# Patient Record
Sex: Female | Born: 1970 | Race: Black or African American | Hispanic: No | Marital: Married | Smoking: Never smoker
Health system: Southern US, Community
[De-identification: ages and names within clinical notes are randomized; demographics above are authoritative.]

## PROBLEM LIST (undated history)

## (undated) DIAGNOSIS — N921 Excessive and frequent menstruation with irregular cycle: Secondary | ICD-10-CM

## (undated) DIAGNOSIS — F909 Attention-deficit hyperactivity disorder, unspecified type: Secondary | ICD-10-CM

## (undated) DIAGNOSIS — H04129 Dry eye syndrome of unspecified lacrimal gland: Secondary | ICD-10-CM

## (undated) DIAGNOSIS — D649 Anemia, unspecified: Secondary | ICD-10-CM

## (undated) DIAGNOSIS — I82409 Acute embolism and thrombosis of unspecified deep veins of unspecified lower extremity: Secondary | ICD-10-CM

## (undated) DIAGNOSIS — K909 Intestinal malabsorption, unspecified: Secondary | ICD-10-CM

## (undated) HISTORY — DX: Anemia, unspecified: D64.9

## (undated) HISTORY — DX: Dry eye syndrome of unspecified lacrimal gland: H04.129

## (undated) HISTORY — DX: Attention-deficit hyperactivity disorder, unspecified type: F90.9

## (undated) HISTORY — DX: Intestinal malabsorption, unspecified: K90.9

## (undated) HISTORY — DX: Excessive and frequent menstruation with irregular cycle: N92.1

---

## 1990-01-19 HISTORY — PX: OTHER SURGICAL HISTORY: SHX169

## 2011-11-04 DIAGNOSIS — R41840 Attention and concentration deficit: Secondary | ICD-10-CM | POA: Insufficient documentation

## 2011-11-04 DIAGNOSIS — R519 Headache, unspecified: Secondary | ICD-10-CM | POA: Insufficient documentation

## 2011-11-04 DIAGNOSIS — G4700 Insomnia, unspecified: Secondary | ICD-10-CM | POA: Insufficient documentation

## 2012-01-15 ENCOUNTER — Encounter: Payer: Self-pay | Admitting: Physician Assistant

## 2012-01-15 ENCOUNTER — Ambulatory Visit (INDEPENDENT_AMBULATORY_CARE_PROVIDER_SITE_OTHER): Payer: Self-pay | Admitting: Physician Assistant

## 2012-01-15 VITALS — BP 120/76 | HR 87 | Ht 62.0 in | Wt 129.0 lb

## 2012-01-15 DIAGNOSIS — Z131 Encounter for screening for diabetes mellitus: Secondary | ICD-10-CM

## 2012-01-15 DIAGNOSIS — E559 Vitamin D deficiency, unspecified: Secondary | ICD-10-CM

## 2012-01-15 DIAGNOSIS — Z862 Personal history of diseases of the blood and blood-forming organs and certain disorders involving the immune mechanism: Secondary | ICD-10-CM

## 2012-01-15 DIAGNOSIS — Z0189 Encounter for other specified special examinations: Secondary | ICD-10-CM

## 2012-01-15 DIAGNOSIS — M2550 Pain in unspecified joint: Secondary | ICD-10-CM

## 2012-01-15 DIAGNOSIS — Z13220 Encounter for screening for lipoid disorders: Secondary | ICD-10-CM

## 2012-01-15 DIAGNOSIS — M25569 Pain in unspecified knee: Secondary | ICD-10-CM

## 2012-01-15 DIAGNOSIS — Z7689 Persons encountering health services in other specified circumstances: Secondary | ICD-10-CM

## 2012-01-15 DIAGNOSIS — F909 Attention-deficit hyperactivity disorder, unspecified type: Secondary | ICD-10-CM

## 2012-01-15 MED ORDER — LISDEXAMFETAMINE DIMESYLATE 70 MG PO CAPS: 70.0000 mg | ORAL_CAPSULE | ORAL | Status: DC

## 2012-01-15 NOTE — Patient Instructions (Addendum)
Will call with lab results. Will refer to ortho. Need to schedule pap smear.

## 2012-01-16 LAB — CBC WITH DIFFERENTIAL/PLATELET
Lymphocytes Relative: 34 % (ref 12–46)
Lymphs Abs: 2 10*3/uL (ref 0.7–4.0)
Neutro Abs: 3.2 10*3/uL (ref 1.7–7.7)
Neutrophils Relative %: 54 % (ref 43–77)
Platelets: 397 10*3/uL (ref 150–400)
RBC: 4.41 MIL/uL (ref 3.87–5.11)
WBC: 6 10*3/uL (ref 4.0–10.5)

## 2012-01-16 LAB — IRON AND TIBC
%SAT: 13 % — ABNORMAL LOW (ref 20–55)
TIBC: 234 ug/dL — ABNORMAL LOW (ref 250–470)

## 2012-01-17 DIAGNOSIS — E559 Vitamin D deficiency, unspecified: Secondary | ICD-10-CM | POA: Insufficient documentation

## 2012-01-17 DIAGNOSIS — D508 Other iron deficiency anemias: Secondary | ICD-10-CM | POA: Insufficient documentation

## 2012-01-17 NOTE — Progress Notes (Signed)
  Subjective:    Patient ID: April Vega, female    DOB: 1970/11/08, 41 y.o.   MRN: [id number]  HPI Patient present to the clinic to establish care. PMH was reviewed. She has a history of anemia per patient due to diet. She takes IV iron when she needs it. She also has had Vit. D deficency and is currently taking Vitamin D 5000 units daily.. Wants checked today. Not had cholesterol and routine labs checked in many years if ever. She is scheduled for Pap smear at obgyn. She will get yearly mammogram in July 2013.   She has been on Vyvanse for the last 3 months. 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 She is not completely happy with it but wants to give it 3 more months.  She has a lot of bilateral knee pain but greater in left knee. She was seen by orthopedic and wants to be referred to one closer to [facility].     Review of Systems     Objective:   Physical Exam  Constitutional: She is oriented to person, place, and time. She appears well-developed and well-nourished.  HENT:  Head: Normocephalic and atraumatic.  Neck: Normal range of motion. Neck supple. No thyromegaly present.  Cardiovascular: Normal rate, regular rhythm and normal heart sounds.   Pulmonary/Chest: Effort normal and breath sounds normal. She has no wheezes.  Lymphadenopathy:    She has no cervical adenopathy.  Neurological: She is alert and oriented to person, place, and time.  Skin: Skin is warm and dry.  Psychiatric: She has a normal mood and affect. Her behavior is normal.          Assessment & Plan:  History of anemia- Will get CBC and iron panel today. Will call with results.   Vitamin D def- Check Vit D today and call with results.  ADHD- Refilled Vyvanse. Will need to come and get prescription monthly. Recheck in 3 months.  Joint pain- Will referred to ortho per patients request.  Lab form sent with patient to get Lipid and CMp drawn. Need to follow up with CPE.

## 2012-02-03 ENCOUNTER — Ambulatory Visit
Admission: RE | Admit: 2012-02-03 | Discharge: 2012-02-03 | Disposition: A | Discharge status: Home / Self Care | Payer: BC Managed Care – PPO | Source: Ambulatory Visit | Attending: Sports Medicine | Admitting: Sports Medicine

## 2012-02-03 ENCOUNTER — Other Ambulatory Visit: Payer: Self-pay | Admitting: Sports Medicine

## 2012-02-03 DIAGNOSIS — R52 Pain, unspecified: Secondary | ICD-10-CM

## 2012-02-03 DIAGNOSIS — M25562 Pain in left knee: Secondary | ICD-10-CM

## 2012-02-05 ENCOUNTER — Other Ambulatory Visit (HOSPITAL_COMMUNITY): Payer: Self-pay | Admitting: Sports Medicine

## 2012-02-05 DIAGNOSIS — M79605 Pain in left leg: Secondary | ICD-10-CM

## 2012-02-10 ENCOUNTER — Other Ambulatory Visit (HOSPITAL_COMMUNITY): Payer: BC Managed Care – PPO

## 2012-02-10 ENCOUNTER — Encounter (HOSPITAL_COMMUNITY): Payer: BC Managed Care – PPO

## 2012-02-19 ENCOUNTER — Other Ambulatory Visit (HOSPITAL_COMMUNITY): Payer: Self-pay | Admitting: Sports Medicine

## 2012-02-19 DIAGNOSIS — M79605 Pain in left leg: Secondary | ICD-10-CM

## 2012-02-26 ENCOUNTER — Ambulatory Visit: Payer: BC Managed Care – PPO | Admitting: Physician Assistant

## 2012-02-29 ENCOUNTER — Other Ambulatory Visit (HOSPITAL_COMMUNITY): Payer: BC Managed Care – PPO

## 2012-02-29 ENCOUNTER — Encounter (HOSPITAL_COMMUNITY): Payer: BC Managed Care – PPO

## 2012-02-29 ENCOUNTER — Ambulatory Visit: Payer: BC Managed Care – PPO | Admitting: Physician Assistant

## 2012-03-03 ENCOUNTER — Ambulatory Visit: Payer: BC Managed Care – PPO | Admitting: Family Medicine

## 2012-03-11 ENCOUNTER — Encounter (HOSPITAL_COMMUNITY)
Admission: RE | Admit: 2012-03-11 | Discharge: 2012-03-11 | Disposition: A | Discharge status: Home / Self Care | Payer: BC Managed Care – PPO | Source: Ambulatory Visit | Attending: Sports Medicine | Admitting: Sports Medicine

## 2012-03-11 DIAGNOSIS — M79605 Pain in left leg: Secondary | ICD-10-CM

## 2012-03-11 DIAGNOSIS — M79609 Pain in unspecified limb: Secondary | ICD-10-CM | POA: Insufficient documentation

## 2012-03-11 DIAGNOSIS — C50919 Malignant neoplasm of unspecified site of unspecified female breast: Secondary | ICD-10-CM | POA: Insufficient documentation

## 2012-03-11 MED ORDER — TECHNETIUM TC 99M MEDRONATE IV KIT
25.0000 | PACK | Freq: Once | INTRAVENOUS | Status: AC | PRN
  Administered 2012-03-11: 25 via INTRAVENOUS

## 2012-03-14 ENCOUNTER — Ambulatory Visit (INDEPENDENT_AMBULATORY_CARE_PROVIDER_SITE_OTHER): Payer: BC Managed Care – PPO | Admitting: Physician Assistant

## 2012-03-14 ENCOUNTER — Encounter: Payer: Self-pay | Admitting: Physician Assistant

## 2012-03-14 VITALS — BP 119/76 | HR 90 | Ht 62.0 in | Wt 126.0 lb

## 2012-03-14 DIAGNOSIS — R5383 Other fatigue: Secondary | ICD-10-CM

## 2012-03-14 DIAGNOSIS — Z131 Encounter for screening for diabetes mellitus: Secondary | ICD-10-CM

## 2012-03-14 DIAGNOSIS — R5381 Other malaise: Secondary | ICD-10-CM

## 2012-03-14 DIAGNOSIS — Z13220 Encounter for screening for lipoid disorders: Secondary | ICD-10-CM

## 2012-03-14 DIAGNOSIS — D649 Anemia, unspecified: Secondary | ICD-10-CM

## 2012-03-14 DIAGNOSIS — F909 Attention-deficit hyperactivity disorder, unspecified type: Secondary | ICD-10-CM

## 2012-03-14 MED ORDER — ATOMOXETINE HCL 40 MG PO CAPS: 40.0000 mg | ORAL_CAPSULE | Freq: Every day | ORAL | Status: DC

## 2012-03-14 NOTE — Patient Instructions (Addendum)
Follow up in 1 month with me for CPE and ADHD recheck. Start Strattera and see if you have any benefit. Will call with labs.

## 2012-03-16 DIAGNOSIS — F909 Attention-deficit hyperactivity disorder, unspecified type: Secondary | ICD-10-CM | POA: Insufficient documentation

## 2012-03-16 LAB — COMPLETE METABOLIC PANEL WITH GFR
BUN: 7 mg/dL (ref 6–23)
CO2: 27 mEq/L (ref 19–32)
Calcium: 9.6 mg/dL (ref 8.4–10.5)
Chloride: 106 mEq/L (ref 96–112)
Creat: 0.62 mg/dL (ref 0.50–1.10)
GFR, Est African American: >89 mL/min
Total Bilirubin: 0.5 mg/dL (ref 0.3–1.2)

## 2012-03-16 LAB — LIPID PANEL
LDL Cholesterol: 107 mg/dL — ABNORMAL HIGH (ref 0–99)
Total CHOL/HDL Ratio: 2.5 Ratio

## 2012-03-16 LAB — VITAMIN B12: Vitamin B-12: 427 pg/mL (ref 211–911)

## 2012-03-16 NOTE — Progress Notes (Signed)
  Subjective:    Patient ID: April Vega, female    DOB: 09/21/1971, 41 y.o.   MRN: [id number]  HPI Patient comes in to follow up on Vyvanse and ADHD. Pt had been on vyvanse for 6 months and feels like it is not working. She still has a lot of problems focusing at work. She feels like she has to continually be doing something but she doesn't do anything well. Has also been more tired lately. She has a hx of anemia and has to get regular infusions.   She did not get labs drawn for CPE. Will do this time.   Review of Systems     Objective:   Physical Exam  Constitutional: She is oriented to person, place, and time. She appears well-developed and well-nourished.  HENT:  Head: Normocephalic and atraumatic.  Cardiovascular: Normal rate, regular rhythm and normal heart sounds.   Pulmonary/Chest: Effort normal and breath sounds normal. She has no wheezes.  Neurological: She is alert and oriented to person, place, and time.  Skin: Skin is warm and dry.  Psychiatric: She has a normal mood and affect. Her behavior is normal.          Assessment & Plan:  Anemia- this is ongoing hx and complete work up has been done. Patient gets infusions regularly. Will check HgB today and see where she is at. She made need an infusion.   Fatigue- This could likely be from low HgB; will also check a b12.   ADHD-encouraged regular exercise to help with focus. Discuss Strattera and patient wants to try. Will follow up in 1 month. Side effects discussed along with risk of suicidal thoughts. If patient has any side effects she is to call office and stop med.   Gave lab slip and needs to schedule CPE.

## 2012-04-13 ENCOUNTER — Encounter: Payer: BC Managed Care – PPO | Admitting: Physician Assistant

## 2012-04-15 ENCOUNTER — Encounter: Payer: Self-pay | Admitting: Physician Assistant

## 2012-04-15 ENCOUNTER — Other Ambulatory Visit (HOSPITAL_COMMUNITY)
Admission: RE | Admit: 2012-04-15 | Discharge: 2012-04-15 | Disposition: A | Discharge status: Home / Self Care | Payer: BC Managed Care – PPO | Source: Ambulatory Visit | Attending: Family Medicine | Admitting: Family Medicine

## 2012-04-15 ENCOUNTER — Ambulatory Visit (INDEPENDENT_AMBULATORY_CARE_PROVIDER_SITE_OTHER): Payer: BC Managed Care – PPO | Admitting: Physician Assistant

## 2012-04-15 VITALS — BP 105/62 | HR 82 | Ht 62.0 in | Wt 130.0 lb

## 2012-04-15 DIAGNOSIS — Z0000 Encounter for general adult medical examination without abnormal findings: Secondary | ICD-10-CM

## 2012-04-15 DIAGNOSIS — Z01419 Encounter for gynecological examination (general) (routine) without abnormal findings: Secondary | ICD-10-CM | POA: Insufficient documentation

## 2012-04-15 DIAGNOSIS — F909 Attention-deficit hyperactivity disorder, unspecified type: Secondary | ICD-10-CM

## 2012-04-15 MED ORDER — LISDEXAMFETAMINE DIMESYLATE 70 MG PO CAPS: 70.0000 mg | ORAL_CAPSULE | ORAL | Status: DC

## 2012-04-15 NOTE — Progress Notes (Signed)
Subjective:    Patient ID: April Vega, female    DOB: [birth year], 41 y.o.   MRN: [id number]  HPI     Review of Systems     Objective:   Physical Exam        Assessment & Plan:   Subjective:     April Vega is a 41 y.o. female and is here for a comprehensive physical exam. The patient reports Strattera has not worked at all. She feels like she is not even on medication. she is just as not focused and foggy brain as ever. She has had formal evaluation for ADHD and was borderline. She was beening treated with vyvanse and it worked well in the past. She is seeing a neurologist to evaluate for any other causes that might not be due to ADHD at all. She is scheduled for a MRI of brain in upcoming weeks. 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  She feels good other wise. She has normal bowel movements, no chest pain, or general pain. She tries to exercise regularly and eat right.   History   Social History  . Marital Status: Married    Spouse Name: N/A    Number of Children: N/A  . Years of Education: N/A   Occupational History  . Not on file.   Social History Main Topics  . Smoking status: Never Smoker   . Smokeless tobacco: Not on file  . Alcohol Use: Not on file  . Drug Use: Not on file  . Sexually Active: Not on file   Other Topics Concern  . Not on file   Social History Narrative  . No narrative on file   Health Maintenance  Topic Date Due  . Mammogram  04/15/2012  . Influenza Vaccine  06/21/2012  . Pap Smear  04/16/2015  . Tetanus/tdap  10/16/2020    The following portions of the patient's history were reviewed and updated as appropriate: allergies, current medications, past family history, past medical history, past social history, past surgical history and problem list.  Review of Systems Pertinent items are noted in HPI.   Objective:    BP 105/62  Pulse 82  Ht 5\' 2"  (1.575 m)  Wt 130 lb (58.968 kg)  BMI 23.78 kg/m2  SpO2 100%  LMP  04/09/2012 General appearance: alert, cooperative and appears stated age Head: Normocephalic, without obvious abnormality, atraumatic Eyes: conjunctivae/corneas clear. PERRL, EOM's intact. Fundi benign. Ears: normal TM's and external ear canals both ears Nose: Nares normal. Septum midline. Mucosa normal. No drainage or sinus tenderness. Throat: lips, mucosa, and tongue normal; teeth and gums normal Neck: no adenopathy, no carotid bruit, no JVD, supple, symmetrical, trachea midline and thyroid not enlarged, symmetric, no tenderness/mass/nodules Back: symmetric, no curvature. ROM normal. No CVA tenderness. Lungs: clear to auscultation bilaterally Breasts: Normal appearance, many lumps and different breast consisitieties felt throughout both breast. 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 Lymph nodes: Cervical, supraclavicular, and axillary nodes normal. Neurologic: Alert and oriented X 3, normal strength and tone. Normal symmetric reflexes. Normal coordination  and gait    Assessment:    Healthy female exam.      Plan:    CPE- labs have already been drawn and look great! Will call with pap results. Last mammogram was in Jan/2013 does need one and to follow up in 6 months since had Left breast cancer. Previous breast center is following up with patient. I don't have any paperwork on her evaluation of ADHD, Breast cancer or contact with neurologist. Dr. Rosalyn Charters is her neurologist. I  discussed with the patient that it would be beneficial to have so that I could better follow and treat her. She stated she would get me records. Continue to maintain healthy diet and exercise regulary. Follow up in 1 month with change to vyvanse. Gave pt card. See After Visit Summary for Counseling Recommendations

## 2012-04-15 NOTE — Patient Instructions (Addendum)
Recommend calcium 4 servings daily or 500mg  twice a day. STart back on vyvanse and recheck in 1 month. Continue to have healthy diet and exercise regularly.

## 2012-04-18 ENCOUNTER — Encounter: Payer: Self-pay | Admitting: Physician Assistant

## 2012-05-13 ENCOUNTER — Ambulatory Visit: Payer: BC Managed Care – PPO | Admitting: Physician Assistant

## 2012-06-15 ENCOUNTER — Ambulatory Visit (INDEPENDENT_AMBULATORY_CARE_PROVIDER_SITE_OTHER): Payer: BC Managed Care – PPO | Admitting: Physician Assistant

## 2012-06-15 ENCOUNTER — Encounter: Payer: Self-pay | Admitting: Physician Assistant

## 2012-06-15 VITALS — BP 117/76 | HR 83 | Ht 62.0 in | Wt 129.0 lb

## 2012-06-15 DIAGNOSIS — F909 Attention-deficit hyperactivity disorder, unspecified type: Secondary | ICD-10-CM

## 2012-06-15 DIAGNOSIS — R9389 Abnormal findings on diagnostic imaging of other specified body structures: Secondary | ICD-10-CM

## 2012-06-15 NOTE — Progress Notes (Signed)
  Subjective:    Patient ID: April Vega, female    DOB: 03-Aug-1971, 41 y.o.   MRN: [id number]  HPI Patient presents to the clinic to follow up on ADHD she brings in copies of her formal evaluation for ADHD which we will scan in. Pt denies any palpitations, CP, insomnia,mood changes,  loss of appetite or increase in BP. She believes it has helped some and the most any medication has helped with focus but doesn't feel like it has helped as much as before when she was on it. She also wants to try something cheaper because money is tight right now.   She also would like a second opinion neurologist. She recently had MRI and Cornter stone Elanie Ferria suspects MS. She wants someone else to look at MRI.    Review of Systems     Objective:   Physical Exam  Constitutional: She is oriented to person, place, and time. She appears well-developed and well-nourished.  HENT:  Head: Normocephalic and atraumatic.  Cardiovascular: Normal rate, regular rhythm and normal heart sounds.   Pulmonary/Chest: Effort normal and breath sounds normal.  Neurological: She is alert and oriented to person, place, and time.  Skin: Skin is warm and dry.  Psychiatric: She has a normal mood and affect. Her behavior is normal.          Assessment & Plan:  ADHD- I would be more than happy to refill Vyanse but patient wants to try cheaper. I told her to call insurance and find out what stimulants would be the cheapest for her. I will print rx and refill for next 2 months. Follow up in 3 months.   Abnormal MRI- Neurologist suspects MS but wants to do further testing will refer for neurologist. Pt encouraged to get all documentation sent to office.

## 2012-06-15 NOTE — Patient Instructions (Addendum)
Referral for 2nd opinion neurologist. Call if you don't hear from [place] in next week.   Call with ADHD med that insurance will cover.

## 2012-06-17 ENCOUNTER — Other Ambulatory Visit: Payer: Self-pay | Admitting: Physician Assistant

## 2012-06-17 ENCOUNTER — Telehealth: Payer: Self-pay | Admitting: *Deleted

## 2012-06-17 MED ORDER — METHYLPHENIDATE HCL ER 20 MG PO TBCR: 20.0000 mg | EXTENDED_RELEASE_TABLET | ORAL | Status: DC

## 2012-06-17 NOTE — Progress Notes (Signed)
Let patient know I have printed rx and available for pick up. I started with 20mg  once a day may increase after one month depending on response.

## 2012-06-17 NOTE — Telephone Encounter (Signed)
Pt called insurance and they will cover the Methylphenidate ER 20mg  one tab twice a day

## 2012-06-17 NOTE — Progress Notes (Signed)
LMOM informing Pt  

## 2012-07-18 ENCOUNTER — Encounter: Payer: Self-pay | Admitting: Physician Assistant

## 2012-07-18 ENCOUNTER — Ambulatory Visit (INDEPENDENT_AMBULATORY_CARE_PROVIDER_SITE_OTHER): Payer: BC Managed Care – PPO | Admitting: Physician Assistant

## 2012-07-18 VITALS — BP 122/68 | HR 93 | Temp 99.1°F | Wt 128.0 lb

## 2012-07-18 DIAGNOSIS — R21 Rash and other nonspecific skin eruption: Secondary | ICD-10-CM

## 2012-07-18 DIAGNOSIS — D649 Anemia, unspecified: Secondary | ICD-10-CM

## 2012-07-18 DIAGNOSIS — R9389 Abnormal findings on diagnostic imaging of other specified body structures: Secondary | ICD-10-CM

## 2012-07-18 DIAGNOSIS — D508 Other iron deficiency anemias: Secondary | ICD-10-CM

## 2012-07-18 DIAGNOSIS — F909 Attention-deficit hyperactivity disorder, unspecified type: Secondary | ICD-10-CM

## 2012-07-18 MED ORDER — METHYLPHENIDATE HCL ER 20 MG PO TBCR: EXTENDED_RELEASE_TABLET | ORAL | Status: DC

## 2012-07-18 MED ORDER — MOMETASONE FUROATE 0.1 % EX CREA: 1.0000 "application " | TOPICAL_CREAM | Freq: Every day | CUTANEOUS | Status: DC

## 2012-07-18 NOTE — Progress Notes (Signed)
  Subjective:    Patient ID: Nadira Single, female    DOB: May 03, 1971, 41 y.o.   MRN: [id number]  HPI Patient presents to the clinic to follow up on ADHD, to discuss referral and to get a refill for cream for rash.   ADHD is not responding to Metadate 20mg  which insurance will pay for. She really doesn't feel like it benefits her at all. She has had the best response on vyvanse but wants to try highest dose before she pays for vyvanse. Denies any CP, palpitations, insomnia, or blood pressure problems.   She was never called with referral to neurologist to get 2nd opinion on MRI concerning MS. I did make referral and she was never called.   She has an on again off again rash on face. She was given Elocon cream and it does work. She needs refill today. She does not have rash today.   History of anemia. Started to feel more cold and fatigue usually means infusion is needed. Would like to go to [facility] hematology.    Review of Systems     Objective:   Physical Exam  Constitutional: She is oriented to person, place, and time. She appears well-developed and well-nourished.  HENT:  Head: Normocephalic and atraumatic.  Cardiovascular: Normal rate, regular rhythm and normal heart sounds.   Pulmonary/Chest: Effort normal and breath sounds normal. She has no wheezes.  Neurological: She is alert and oriented to person, place, and time.  Skin: Skin is dry.       Rash not present today.  Psychiatric: She has a normal mood and affect. Her behavior is normal.          Assessment & Plan:  ADHD- Increased to 40mg  in am of metadate. If not improving in 2 weeks then increase by 20mg  more to 60mg  today in the morning. If not working then call and I suggest that you pay for vyvanse monthly.   Abnormal MRI- Victorino Dike made appt while in office for [place] neurolog[facility] to discuss MS diagnosis from previous MRI. Pt given number to call [place] if not contacted in next week. Referral was made previously but  not completed.  Rash- Refilled elocon cream. Encouraged pt not to use cream all the time because could cause some discoloration/hypopgimentation.   Anemia- Will check CBC and ferritin level today and see if needs to be set up with [facility] hematology for iron infusion.   Pt got flu shot at work.

## 2012-07-19 LAB — CBC WITH DIFFERENTIAL/PLATELET
Basophils Absolute: 0.1 10*3/uL (ref 0.0–0.1)
Eosinophils Relative: 7 % — ABNORMAL HIGH (ref 0–5)
Lymphocytes Relative: 37 % (ref 12–46)
Neutro Abs: 3.1 10*3/uL (ref 1.7–7.7)
Neutrophils Relative %: 45 % (ref 43–77)
Platelets: 444 10*3/uL — ABNORMAL HIGH (ref 150–400)
RDW: 13.4 % (ref 11.5–15.5)
WBC: 6.8 10*3/uL (ref 4.0–10.5)

## 2012-07-19 LAB — FERRITIN: Ferritin: 56 ng/mL (ref 10–291)

## 2012-07-20 ENCOUNTER — Other Ambulatory Visit: Payer: Self-pay | Admitting: Physician Assistant

## 2012-07-20 ENCOUNTER — Telehealth: Payer: Self-pay | Admitting: Physician Assistant

## 2012-07-20 DIAGNOSIS — Z862 Personal history of diseases of the blood and blood-forming organs and certain disorders involving the immune mechanism: Secondary | ICD-10-CM

## 2012-07-20 LAB — IRON AND TIBC
Iron: 48 ug/dL (ref 42–145)
UIBC: 242 ug/dL (ref 125–400)

## 2012-07-20 NOTE — Telephone Encounter (Signed)
April Vega will you call [facility] hematology and find out what Hgb or ferritin levels have to be before they will do iron transfusion. This patient get iron infusion periodically but never been there. Her ferritin levels have dropped significantly and at 56 now but hgB is still 12.7.   Thanks.

## 2012-07-20 NOTE — Telephone Encounter (Signed)
PA from [facility]. States further eval is needed to determine.

## 2012-07-20 NOTE — Addendum Note (Signed)
Addended by: Ellsworth Lennox on: 07/20/2012 02:18 PM   Modules accepted: Orders

## 2012-08-17 ENCOUNTER — Other Ambulatory Visit: Payer: Self-pay | Admitting: *Deleted

## 2012-08-17 MED ORDER — METHYLPHENIDATE HCL ER 20 MG PO TBCR: EXTENDED_RELEASE_TABLET | ORAL | Status: DC

## 2012-08-17 NOTE — Telephone Encounter (Signed)
Pt calls and states that taking 2 of the methylphenadate 20mg  was not working so increased to 3 a day as you instructed her she could do. Needs new script printed with new directions. Call when ready for pick up

## 2012-08-17 NOTE — Telephone Encounter (Signed)
Pt.notified

## 2012-08-17 NOTE — Telephone Encounter (Signed)
Ready for pick up

## 2012-09-23 ENCOUNTER — Encounter: Payer: Self-pay | Admitting: Physician Assistant

## 2012-09-23 ENCOUNTER — Ambulatory Visit (INDEPENDENT_AMBULATORY_CARE_PROVIDER_SITE_OTHER): Payer: BC Managed Care – PPO | Admitting: Physician Assistant

## 2012-09-23 ENCOUNTER — Other Ambulatory Visit: Payer: Self-pay | Admitting: Physician Assistant

## 2012-09-23 VITALS — BP 115/73 | HR 86 | Ht 62.0 in | Wt 131.0 lb

## 2012-09-23 DIAGNOSIS — F411 Generalized anxiety disorder: Secondary | ICD-10-CM

## 2012-09-23 DIAGNOSIS — F909 Attention-deficit hyperactivity disorder, unspecified type: Secondary | ICD-10-CM

## 2012-09-23 DIAGNOSIS — D649 Anemia, unspecified: Secondary | ICD-10-CM

## 2012-09-23 MED ORDER — LISDEXAMFETAMINE DIMESYLATE 70 MG PO CAPS: 70.0000 mg | ORAL_CAPSULE | ORAL | Status: DC

## 2012-09-23 MED ORDER — CITALOPRAM HYDROBROMIDE 20 MG PO TABS: 20.0000 mg | ORAL_TABLET | Freq: Every day | ORAL | Status: DC

## 2012-09-23 MED ORDER — CLONAZEPAM 0.5 MG PO TABS: 0.5000 mg | ORAL_TABLET | Freq: Two times a day (BID) | ORAL | Status: DC | PRN

## 2012-09-23 NOTE — Patient Instructions (Signed)
Start celexa at night at 20mg  then after one week increase to 40mg . Only use klonapin as needed.   Stop metadate. Start [employer].

## 2012-09-23 NOTE — Progress Notes (Signed)
  Subjective:    Patient ID: April Vega, female    DOB: 08/23/1971, 42 y.o.   MRN: [id number]  HPI Patient presents to the clinic to followup on ADHD. She denies any side effects of medication. She does not feel like medication is helping her to focus. She felt the best on vyvanse the insurance however did not want to pay for vyvanse. She is up to the maximum dose on the Metadate. She still feels very unfocused. She also complains of lately feeling very anxious to the point of anxiety attacks. She keeps thinking that things she needs to do but cannot focus enough to do them. She is very easily annoyed and irritable. She has a lot of trouble relaxing and is worrying about a lot of issues. She does have problems falling asleep at night.    Review of Systems     Objective:   Physical Exam  Constitutional: She is oriented to person, place, and time. She appears well-developed and well-nourished.  HENT:  Head: Normocephalic and atraumatic.  Cardiovascular: Normal rate, regular rhythm and normal heart sounds.   Pulmonary/Chest: Effort normal and breath sounds normal. She has no wheezes.  Neurological: She is alert and oriented to person, place, and time.  Skin: Skin is warm and dry.  Psychiatric: She has a normal mood and affect. Her behavior is normal.          Assessment & Plan:  ADHD-patient was instructed to stop Metadate. We'll send Rx for Vyvanse to start. Patient experienced the most benefit at 70 mg and will start at that dose. Did briefly discuss with patient that Metadate could also be causing her anxiety to be worse as a side effect. While waiting for vyvanse approval and not being on any medication for ADHD let's see if any other anxious feelings improve as well as sleep.   GAD-  GAD-7 score was 14(moderate anxiety). Will start treatment with Celexa 20 mg at night and increase to 40 mg after one week. Discuss with patient that does take 4-6 weeks to get to therapeutic dosage.  Discussed side effects of worsening depression. The patient were to have suicidal thoughts to stop medication call office. Discussed that some people experience weight gain and sexual side effects. Followup in office in 6 weeks. Also gave her Klonopin to use for anxiety attacks only as needed. In detail described use and purpose for Klonopin

## 2012-09-27 LAB — IBC PANEL: %SAT: 12 % — ABNORMAL LOW (ref 20–55)

## 2012-09-27 LAB — HEMOGLOBIN: Hemoglobin: 12.7 g/dL (ref 12.0–15.0)

## 2012-09-27 LAB — IRON: Iron: 36 ug/dL — ABNORMAL LOW (ref 42–145)

## 2012-10-18 ENCOUNTER — Telehealth: Payer: Self-pay | Admitting: *Deleted

## 2012-10-18 NOTE — Telephone Encounter (Signed)
Pt is wondering if you will increase her celexa to 40mg .  She was also requesting a refill for her vyvanse but I told her that I couldn't fill it until Monday feb 3rd.  Please advise

## 2012-10-19 MED ORDER — CITALOPRAM HYDROBROMIDE 40 MG PO TABS: 40.0000 mg | ORAL_TABLET | Freq: Every day | ORAL | Status: DC

## 2012-10-19 NOTE — Telephone Encounter (Signed)
Pt.notified

## 2012-10-19 NOTE — Telephone Encounter (Signed)
Yes and sent to pharmacy. May double up on 20's until run out.

## 2012-10-24 ENCOUNTER — Other Ambulatory Visit: Payer: Self-pay | Admitting: *Deleted

## 2012-10-24 MED ORDER — LISDEXAMFETAMINE DIMESYLATE 70 MG PO CAPS: 70.0000 mg | ORAL_CAPSULE | ORAL | Status: DC

## 2012-10-24 NOTE — Telephone Encounter (Signed)
vyvanse refilled

## 2012-11-14 ENCOUNTER — Telehealth: Payer: Self-pay | Admitting: *Deleted

## 2012-11-14 DIAGNOSIS — E559 Vitamin D deficiency, unspecified: Secondary | ICD-10-CM

## 2012-11-14 DIAGNOSIS — D509 Iron deficiency anemia, unspecified: Secondary | ICD-10-CM

## 2012-11-15 NOTE — Telephone Encounter (Signed)
Labs entered.

## 2012-11-18 LAB — FERRITIN: Ferritin: 58 ng/mL (ref 10–291)

## 2012-11-18 LAB — VITAMIN D 25 HYDROXY (VIT D DEFICIENCY, FRACTURES): Vit D, 25-Hydroxy: 45 ng/mL (ref 30–89)

## 2012-11-18 LAB — IRON AND TIBC: Iron: 27 ug/dL — ABNORMAL LOW (ref 42–145)

## 2012-11-23 ENCOUNTER — Encounter: Payer: Self-pay | Admitting: Physician Assistant

## 2012-11-23 ENCOUNTER — Ambulatory Visit (INDEPENDENT_AMBULATORY_CARE_PROVIDER_SITE_OTHER): Payer: BC Managed Care – PPO

## 2012-11-23 ENCOUNTER — Ambulatory Visit (INDEPENDENT_AMBULATORY_CARE_PROVIDER_SITE_OTHER): Payer: BC Managed Care – PPO | Admitting: Physician Assistant

## 2012-11-23 VITALS — BP 112/71 | HR 96 | Wt 124.0 lb

## 2012-11-23 DIAGNOSIS — S99921A Unspecified injury of right foot, initial encounter: Secondary | ICD-10-CM

## 2012-11-23 DIAGNOSIS — E559 Vitamin D deficiency, unspecified: Secondary | ICD-10-CM

## 2012-11-23 DIAGNOSIS — F909 Attention-deficit hyperactivity disorder, unspecified type: Secondary | ICD-10-CM

## 2012-11-23 DIAGNOSIS — W19XXXA Unspecified fall, initial encounter: Secondary | ICD-10-CM

## 2012-11-23 DIAGNOSIS — D508 Other iron deficiency anemias: Secondary | ICD-10-CM

## 2012-11-23 DIAGNOSIS — S8990XA Unspecified injury of unspecified lower leg, initial encounter: Secondary | ICD-10-CM

## 2012-11-23 DIAGNOSIS — M79609 Pain in unspecified limb: Secondary | ICD-10-CM

## 2012-11-23 DIAGNOSIS — F411 Generalized anxiety disorder: Secondary | ICD-10-CM

## 2012-11-23 MED ORDER — TRAMADOL HCL 50 MG PO TABS: 50.0000 mg | ORAL_TABLET | Freq: Four times a day (QID) | ORAL | Status: DC | PRN

## 2012-11-23 MED ORDER — FERROUS SULFATE 325 (65 FE) MG PO TABS: 325.0000 mg | ORAL_TABLET | Freq: Two times a day (BID) | ORAL | Status: DC

## 2012-11-23 MED ORDER — LISDEXAMFETAMINE DIMESYLATE 70 MG PO CAPS: 70.0000 mg | ORAL_CAPSULE | ORAL | Status: DC

## 2012-11-23 MED ORDER — CITALOPRAM HYDROBROMIDE 40 MG PO TABS: 40.0000 mg | ORAL_TABLET | Freq: Every day | ORAL | Status: DC

## 2012-11-23 NOTE — Progress Notes (Signed)
  Subjective:    Patient ID: April Vega, female    DOB: [birth year], 42 y.o.   MRN: [id number]  HPI Patient presents to the clinic to get med refills and discuss new right foot injury.   Anxiety is ongoing but much more controlled today. Celexa is doing very well with occasional klonapin every other day or so. Denies any suicidal thoughts. Denies anything that makes worse or better.  ADHD is much better on Vyvanse. Still does not seem optimal. She feels like she absorbs at a faster rate. Sleeping well, does not feel like it causing jitterness. She is able to focus much better at work. She is still able to have an appetite and eats a balanced diet.   IDA- labs drawn and stable. Is currently taking OTC iron 325 twice a day. In the past she has not responded to oral iron. Would like to try something else and see if more effective. Does make her constipated and has to combat with diet. Refuses to eat food with iron in them stating she does not like them.   Vitamin D defiency labs drawn and stable and above normal. Does feel like she has more energy. Taking vit D daily.    Yesterday pt fell off step on right foot. Foot turned all the way back. Pt reports 6/10 pain. She has tried ASA, heating pad, sports cream and her right foot is swollen and painful to touch. Plantar flexion and touch makes pain worse and being still makes feel the best.       Review of Systems     Objective:   Physical Exam  Constitutional: She is oriented to person, place, and time. She appears well-developed and well-nourished.  HENT:  Head: Normocephalic and atraumatic.  Eyes: Conjunctivae are normal.  Neck: Normal range of motion. Neck supple. No thyromegaly present.  Cardiovascular: Normal rate, regular rhythm and normal heart sounds.   Pulmonary/Chest: Effort normal and breath sounds normal. She has no wheezes.  Musculoskeletal:  Right foot- no brusing. Swelling and erythema over top of right foot. Strength 5/5.  Pain with resistance to plantar flexion. Normal ROM. No laxity at ankle. Tenderness to palpation over top of foot. Pulses are 2+ and controlled.   Lymphadenopathy:    She has no cervical adenopathy.  Neurological: She is alert and oriented to person, place, and time.  Skin: Skin is warm and dry.  Psychiatric: She has a normal mood and affect. Her behavior is normal.          Assessment & Plan:  GAD- GAD-7 was 6. Greatly improved since last visit. Refilled Celexa.April Vega is as needed does not need a refill today. Seems much more controlled on this regimen.   ADHD- Refilled Vyvanse.   IDA- Gave samples of a new iron supplement Fusion plus daily. If pt likes them and does not irritate stomach will call in rx. Reassured pt that right now her stores and iron are very stable and not a candidate for IV iron.  Vitamin d defiency- Labs look great today stay on same dose.   Right foot injury- Xray revealed no fracture. Reassured pt that it is a soft tissue sprain/bruise. Wrapped with ace bandage for support. Encouraged ABC's with feet. Ibuprofen for pain and swelling. Ice and elevate. Call if not improving.

## 2012-11-23 NOTE — Patient Instructions (Signed)
Ibuprofen 800mg  up to three times a day. Tramadol only has needed. ABC's to strengthen muscles. Ice area 15-24minutes at night.   Try samples of fusion see if you tolerate them.   Will refer to dermatology.

## 2012-12-19 ENCOUNTER — Other Ambulatory Visit: Payer: Self-pay | Admitting: Physician Assistant

## 2012-12-23 ENCOUNTER — Other Ambulatory Visit: Payer: Self-pay | Admitting: *Deleted

## 2012-12-23 MED ORDER — LISDEXAMFETAMINE DIMESYLATE 70 MG PO CAPS: 70.0000 mg | ORAL_CAPSULE | ORAL | Status: DC

## 2013-01-27 ENCOUNTER — Ambulatory Visit (INDEPENDENT_AMBULATORY_CARE_PROVIDER_SITE_OTHER): Payer: BC Managed Care – PPO | Admitting: Physician Assistant

## 2013-01-27 ENCOUNTER — Encounter: Payer: Self-pay | Admitting: Physician Assistant

## 2013-01-27 VITALS — BP 130/77 | HR 97 | Wt 130.0 lb

## 2013-01-27 DIAGNOSIS — R454 Irritability and anger: Secondary | ICD-10-CM

## 2013-01-27 DIAGNOSIS — R4589 Other symptoms and signs involving emotional state: Secondary | ICD-10-CM

## 2013-01-27 DIAGNOSIS — F411 Generalized anxiety disorder: Secondary | ICD-10-CM

## 2013-01-27 MED ORDER — ESCITALOPRAM OXALATE 10 MG PO TABS: ORAL_TABLET | ORAL | Status: DC

## 2013-01-27 NOTE — Progress Notes (Signed)
  Subjective:    Patient ID: April Vega, female    DOB: 1971-04-22, 42 y.o.   MRN: [id number]  HPI Patient presents to the clinic with worsening aggravation, anxiety and anger issues. She has stopped celexa and vyvanse because she states they do not work. She has always had anxiety and celexa did work in the past. She wants to try lexapro. She feels like it is not like her to be so on edge.     Review of Systems     Objective:   Physical Exam  Constitutional: She is oriented to person, place, and time. She appears well-developed and well-nourished.  HENT:  Head: Normocephalic and atraumatic.  Cardiovascular: Normal rate, regular rhythm and normal heart sounds.   Pulmonary/Chest: Effort normal and breath sounds normal.  Neurological: She is alert and oriented to person, place, and time.  Skin: Skin is warm and dry.  Psychiatric: She has a normal mood and affect. Her behavior is normal.          Assessment & Plan:  GAD/anger- will give lexapro to start. Recheck in 6 weeks. Discussed with pt this is not a stimulant and should not help with ADHD issues. I mentioned adding wellbutrin to help with ADHD issues. She only wants to start one medication at a time.

## 2013-02-20 ENCOUNTER — Ambulatory Visit (INDEPENDENT_AMBULATORY_CARE_PROVIDER_SITE_OTHER): Payer: BC Managed Care – PPO | Admitting: Physician Assistant

## 2013-02-20 ENCOUNTER — Encounter: Payer: Self-pay | Admitting: Physician Assistant

## 2013-02-20 VITALS — BP 129/76 | HR 99 | Temp 98.0°F | Wt 134.0 lb

## 2013-02-20 DIAGNOSIS — D509 Iron deficiency anemia, unspecified: Secondary | ICD-10-CM

## 2013-02-20 DIAGNOSIS — R1012 Left upper quadrant pain: Secondary | ICD-10-CM

## 2013-02-20 DIAGNOSIS — E559 Vitamin D deficiency, unspecified: Secondary | ICD-10-CM

## 2013-02-20 DIAGNOSIS — F909 Attention-deficit hyperactivity disorder, unspecified type: Secondary | ICD-10-CM

## 2013-02-20 DIAGNOSIS — F411 Generalized anxiety disorder: Secondary | ICD-10-CM

## 2013-02-20 MED ORDER — ESCITALOPRAM OXALATE 20 MG PO TABS: 20.0000 mg | ORAL_TABLET | Freq: Every day | ORAL | Status: DC

## 2013-02-20 MED ORDER — LISDEXAMFETAMINE DIMESYLATE 70 MG PO CAPS: 70.0000 mg | ORAL_CAPSULE | ORAL | Status: DC

## 2013-02-20 NOTE — Progress Notes (Signed)
  Subjective:    Patient ID: April Vega, female    DOB: [birth year], 42 y.o.   MRN: [id number]  HPI Patient presents to the clinic with left upper quadrant/epigastric pain for last week and a half. Pain is off and on. It can go from dull and achy to sharp and gnawing. Not tried anything to make better. Nothing makes worse. Denies any n/v/d or fever. Did try tums and didn't help. Not starting any new meds.  Denies any radiation of pain to back. Denies any urinary symptoms.   Has anemia and vitamin D defiencency and wants labs checked.   Lexapro is helping with anxiety would like to increase to see if more effective. She has not been taking Vyvanse and would like to try in combination to see if can get a combination that works for her.    Review of Systems     Objective:   Physical Exam  Constitutional: She is oriented to person, place, and time. She appears well-developed and well-nourished.  HENT:  Head: Normocephalic and atraumatic.  Cardiovascular: Normal rate, regular rhythm and normal heart sounds.   Pulmonary/Chest: Effort normal and breath sounds normal.  Abdominal: Soft. Bowel sounds are normal.  Mild tenderness over LUQ and epigastric area.   Neurological: She is alert and oriented to person, place, and time.  Skin: Skin is warm and dry.  Psychiatric: She has a normal mood and affect. Her behavior is normal.          Assessment & Plan:  Left upper quadrant pain- since so sudden will check h.pylori. Gave samples of nexium to take every morning. Will also check pancreatic enzymes. Follow up if not improving.   Anemia/vit d- sent with labs to be checked.  ADHD- reorder vyanse. Follow up in 1 month.   Anxiety- refilled lexapro and increased to 20mg . Follow up in 1 month.

## 2013-02-20 NOTE — Patient Instructions (Addendum)
Don't take any anti-inflammatory. Stop any caffiene/carbonation.  Start nexium daily in the morning before breakfast. Call if not improving.

## 2013-02-21 ENCOUNTER — Other Ambulatory Visit: Payer: Self-pay | Admitting: Physician Assistant

## 2013-02-21 LAB — CBC WITH DIFFERENTIAL/PLATELET
Basophils Relative: 2 % — ABNORMAL HIGH (ref 0–1)
HCT: 37 % (ref 36.0–46.0)
Hemoglobin: 12.3 g/dL (ref 12.0–15.0)
Lymphocytes Relative: 23 % (ref 12–46)
MCHC: 33.2 g/dL (ref 30.0–36.0)
Monocytes Absolute: 0.9 10*3/uL (ref 0.1–1.0)
Monocytes Relative: 14 % — ABNORMAL HIGH (ref 3–12)
Neutro Abs: 3.1 10*3/uL (ref 1.7–7.7)

## 2013-02-21 LAB — LIPASE: Lipase: 45 U/L (ref 0–75)

## 2013-02-21 LAB — IRON: Iron: 38 ug/dL — ABNORMAL LOW (ref 42–145)

## 2013-02-21 LAB — AMYLASE: Amylase: 135 U/L — ABNORMAL HIGH (ref 0–105)

## 2013-02-21 LAB — IBC PANEL: UIBC: 273 ug/dL (ref 125–400)

## 2013-02-21 LAB — FERRITIN: Ferritin: 13 ng/mL (ref 10–291)

## 2013-02-21 MED ORDER — METRONIDAZOLE 500 MG PO TABS: ORAL_TABLET | ORAL | Status: DC

## 2013-02-21 MED ORDER — ESOMEPRAZOLE MAGNESIUM 40 MG PO PACK: PACK | ORAL | Status: DC

## 2013-02-21 MED ORDER — CLARITHROMYCIN 500 MG PO TABS: 500.0000 mg | ORAL_TABLET | Freq: Two times a day (BID) | ORAL | Status: DC

## 2013-02-24 ENCOUNTER — Other Ambulatory Visit: Payer: Self-pay | Admitting: Physician Assistant

## 2013-02-24 DIAGNOSIS — D649 Anemia, unspecified: Secondary | ICD-10-CM

## 2013-05-12 ENCOUNTER — Other Ambulatory Visit: Payer: Self-pay | Admitting: *Deleted

## 2013-05-12 DIAGNOSIS — F909 Attention-deficit hyperactivity disorder, unspecified type: Secondary | ICD-10-CM

## 2013-05-12 MED ORDER — LISDEXAMFETAMINE DIMESYLATE 70 MG PO CAPS: 70.0000 mg | ORAL_CAPSULE | ORAL | Status: DC

## 2013-05-12 MED ORDER — VITAMIN D3 125 MCG (5000 UT) PO CAPS: 1.0000 | ORAL_CAPSULE | Freq: Every day | ORAL | Status: DC

## 2013-05-15 ENCOUNTER — Telehealth: Payer: Self-pay | Admitting: *Deleted

## 2013-05-15 MED ORDER — VITAMIN D (ERGOCALCIFEROL) 1.25 MG (50000 UNIT) PO CAPS: 50000.0000 [IU] | ORAL_CAPSULE | ORAL | Status: DC

## 2013-05-15 NOTE — Telephone Encounter (Signed)
rx for vit d 50,000 units sent

## 2013-05-15 NOTE — Telephone Encounter (Signed)
Pt called today & states that she was wanting the 50,000 units of vit d & not the 5000 daily.  She said that the daily ones don't work well for her.

## 2013-05-15 NOTE — Telephone Encounter (Signed)
Ok to send new rx for 50,000 units weekly vitamin D with 2 refills.

## 2013-06-26 ENCOUNTER — Telehealth: Payer: Self-pay | Admitting: Physician Assistant

## 2013-06-26 NOTE — Telephone Encounter (Signed)
Ok for one more month need follow up within the month.

## 2013-06-26 NOTE — Telephone Encounter (Signed)
Pt wants refill on vyvance.

## 2013-06-26 NOTE — Telephone Encounter (Signed)
April Vega are you ok with filling this?  Looks like she needed to have followed up already.  Please advise

## 2013-06-27 ENCOUNTER — Other Ambulatory Visit: Payer: Self-pay | Admitting: *Deleted

## 2013-06-27 DIAGNOSIS — F909 Attention-deficit hyperactivity disorder, unspecified type: Secondary | ICD-10-CM

## 2013-06-27 MED ORDER — LISDEXAMFETAMINE DIMESYLATE 70 MG PO CAPS: 70.0000 mg | ORAL_CAPSULE | ORAL | Status: DC

## 2013-06-27 NOTE — Telephone Encounter (Signed)
rx printed 7 in your basket to sign.

## 2013-07-21 ENCOUNTER — Ambulatory Visit (INDEPENDENT_AMBULATORY_CARE_PROVIDER_SITE_OTHER): Payer: BC Managed Care – PPO | Admitting: Physician Assistant

## 2013-07-21 ENCOUNTER — Encounter: Payer: Self-pay | Admitting: Physician Assistant

## 2013-07-21 VITALS — BP 129/85 | HR 72 | Wt 136.0 lb

## 2013-07-21 DIAGNOSIS — R5381 Other malaise: Secondary | ICD-10-CM

## 2013-07-21 DIAGNOSIS — F909 Attention-deficit hyperactivity disorder, unspecified type: Secondary | ICD-10-CM

## 2013-07-21 DIAGNOSIS — F411 Generalized anxiety disorder: Secondary | ICD-10-CM

## 2013-07-21 DIAGNOSIS — R5383 Other fatigue: Secondary | ICD-10-CM

## 2013-07-21 DIAGNOSIS — G4700 Insomnia, unspecified: Secondary | ICD-10-CM

## 2013-07-21 MED ORDER — BUPROPION HCL ER (XL) 150 MG PO TB24: 150.0000 mg | ORAL_TABLET | Freq: Every day | ORAL | Status: DC

## 2013-07-21 MED ORDER — VITAMIN D3 125 MCG (5000 UT) PO CAPS: 1.0000 | ORAL_CAPSULE | Freq: Every day | ORAL | Status: DC

## 2013-07-21 NOTE — Patient Instructions (Addendum)
Insomnia- Melatonin 3mg -10mg  1 hour before. Good bedtime routine.   Start wellbutrin 150mg  once a day.  STrattera pack.   Insomnia Insomnia is frequent trouble falling and/or staying asleep. Insomnia can be a long term problem or a short term problem. Both are common. Insomnia can be a short term problem when the wakefulness is related to a certain stress or worry. Long term insomnia is often related to ongoing stress during waking hours and/or poor sleeping habits. Overtime, sleep deprivation itself can make the problem worse. Every little thing feels more severe because you are overtired and your ability to cope is decreased. CAUSES   Stress, anxiety, and depression.  Poor sleeping habits.  Distractions such as TV in the bedroom.  Naps close to bedtime.  Engaging in emotionally charged conversations before bed.  Technical reading before sleep.  Alcohol and other sedatives. They may make the problem worse. They can hurt normal sleep patterns and normal dream activity.  Stimulants such as caffeine for several hours prior to bedtime.  Pain syndromes and shortness of breath can cause insomnia.  Exercise late at night.  Changing time zones may cause sleeping problems (jet lag). It is sometimes helpful to have someone observe your sleeping patterns. They should look for periods of not breathing during the night (sleep apnea). They should also look to see how long those periods last. If you live alone or observers are uncertain, you can also be observed at a sleep clinic where your sleep patterns will be professionally monitored. Sleep apnea requires a checkup and treatment. Give your caregivers your medical history. Give your caregivers observations your family has made about your sleep.  SYMPTOMS   Not feeling rested in the morning.  Anxiety and restlessness at bedtime.  Difficulty falling and staying asleep. TREATMENT   Your caregiver may prescribe treatment for an underlying  medical disorders. Your caregiver can give advice or help if you are using alcohol or other drugs for self-medication. Treatment of underlying problems will usually eliminate insomnia problems.  Medications can be prescribed for short time use. They are generally not recommended for lengthy use.  Over-the-counter sleep medicines are not recommended for lengthy use. They can be habit forming.  You can promote easier sleeping by making lifestyle changes such as:  Using relaxation techniques that help with breathing and reduce muscle tension.  Exercising earlier in the day.  Changing your diet and the time of your last meal. No night time snacks.  Establish a regular time to go to bed.  Counseling can help with stressful problems and worry.  Soothing music and white noise may be helpful if there are background noises you cannot remove.  Stop tedious detailed work at least one hour before bedtime. HOME CARE INSTRUCTIONS   Keep a diary. Inform your caregiver about your progress. This includes any medication side effects. See your caregiver regularly. Take note of:  Times when you are asleep.  Times when you are awake during the night.  The quality of your sleep.  How you feel the next day. This information will help your caregiver care for you.  Get out of bed if you are still awake after 15 minutes. Read or do some quiet activity. Keep the lights down. Wait until you feel sleepy and go back to bed.  Keep regular sleeping and waking hours. Avoid naps.  Exercise regularly.  Avoid distractions at bedtime. Distractions include watching television or engaging in any intense or detailed activity like attempting to balance the  household checkbook.  Develop a bedtime ritual. Keep a familiar routine of bathing, brushing your teeth, climbing into bed at the same time each night, listening to soothing music. Routines increase the success of falling to sleep faster.  Use relaxation  techniques. This can be using breathing and muscle tension release routines. It can also include visualizing peaceful scenes. You can also help control troubling or intruding thoughts by keeping your mind occupied with boring or repetitive thoughts like the old concept of counting sheep. You can make it more creative like imagining planting one beautiful flower after another in your backyard garden.  During your day, work to eliminate stress. When this is not possible use some of the previous suggestions to help reduce the anxiety that accompanies stressful situations. MAKE SURE YOU:   Understand these instructions.  Will watch your condition.  Will get help right away if you are not doing well or get worse. Document Released: 09/04/2000 Document Revised: 11/30/2011 Document Reviewed: 10/05/2007 [facility] Patient Information 2014 [place], [place].

## 2013-07-21 NOTE — Progress Notes (Signed)
  Subjective:    Patient ID: April Vega, female    DOB: 06-19-1971, 42 y.o.   MRN: [id number]  HPI Patient is a 42 year old female today to presents to the clinic to followup on a dull achy ADHD. Patient does not feel like Vyvanse is working. She has tried multiple stimulants and she does not like they help with her symptoms. She denies any feelings of hopelessness or helplessness. She denies any suicidal thoughts or thoughts of hurting others. Her main symptoms are feeling like she cannot focus, cannot get things done and fatigue. She denies taking Lexapro. She is also having a lot of problems sleeping at night. She does work 2 jobs and does not home told 10:30 and 45 every night. She sounds are to relax and one done before 1 AM. And then she has to get up at 6:00am for work the next day. She has not tried anything to help her sleep. She does have a good schedule for neurology [facility] to help further evaluate her previous questionable MRIs.    .    Review of Systems     Objective:   Physical Exam  Constitutional: She is oriented to person, place, and time. She appears well-developed and well-nourished.  HENT:  Head: Normocephalic and atraumatic.  Neck: Normal range of motion. Neck supple. No thyromegaly present.  Cardiovascular: Normal rate, regular rhythm and normal heart sounds.   Pulmonary/Chest: Effort normal and breath sounds normal.  Neurological: She is alert and oriented to person, place, and time.  Skin: Skin is warm and dry.  Psychiatric: She has a normal mood and affect. Her behavior is normal.          Assessment & Plan:   Adult ADHD/unable to concentrate-patient was instructed to stop vyvanse. I would like to start her on Strattera however we do not have any samples. I will call the representative and see if we can get a sample pack for her to try and start. I would also like for her to start Wellbutrin once a day. Hold like to try a different neurotransmitter  and see if she responds better to Wellbutrin and other SSRIs. Followup in 4-6 weeks.  Patient aware she does need a complete physical.  Insomnia- I discussed melatonin with patient today. She can use this 3 mg up to 10 mg one hour before bedtime. I discussed with her working 2 jobs in the home so late is reason for her not able to shut down to labor. She needs to consider was working 2 jobs is going to her health. Discussed good bedtime routine. His most, if not improving we could consider other medications. Patient does not like the idea of knocking herself out with meds.

## 2013-08-22 ENCOUNTER — Other Ambulatory Visit: Payer: Self-pay | Admitting: Physician Assistant

## 2013-10-04 ENCOUNTER — Ambulatory Visit (INDEPENDENT_AMBULATORY_CARE_PROVIDER_SITE_OTHER): Payer: Managed Care, Other (non HMO) | Admitting: Physician Assistant

## 2013-10-04 ENCOUNTER — Encounter: Payer: Self-pay | Admitting: Physician Assistant

## 2013-10-04 ENCOUNTER — Other Ambulatory Visit (HOSPITAL_COMMUNITY)
Admission: RE | Admit: 2013-10-04 | Discharge: 2013-10-04 | Disposition: A | Discharge status: Home / Self Care | Payer: Managed Care, Other (non HMO) | Source: Ambulatory Visit | Attending: Family Medicine | Admitting: Family Medicine

## 2013-10-04 VITALS — BP 117/70 | HR 95 | Wt 140.0 lb

## 2013-10-04 DIAGNOSIS — F909 Attention-deficit hyperactivity disorder, unspecified type: Secondary | ICD-10-CM

## 2013-10-04 DIAGNOSIS — E559 Vitamin D deficiency, unspecified: Secondary | ICD-10-CM

## 2013-10-04 DIAGNOSIS — Z0000 Encounter for general adult medical examination without abnormal findings: Secondary | ICD-10-CM

## 2013-10-04 DIAGNOSIS — Z131 Encounter for screening for diabetes mellitus: Secondary | ICD-10-CM

## 2013-10-04 DIAGNOSIS — F411 Generalized anxiety disorder: Secondary | ICD-10-CM

## 2013-10-04 DIAGNOSIS — Z1151 Encounter for screening for human papillomavirus (HPV): Secondary | ICD-10-CM | POA: Insufficient documentation

## 2013-10-04 DIAGNOSIS — Z01419 Encounter for gynecological examination (general) (routine) without abnormal findings: Secondary | ICD-10-CM

## 2013-10-04 DIAGNOSIS — Z1239 Encounter for other screening for malignant neoplasm of breast: Secondary | ICD-10-CM

## 2013-10-04 DIAGNOSIS — Z13220 Encounter for screening for lipoid disorders: Secondary | ICD-10-CM

## 2013-10-04 DIAGNOSIS — D508 Other iron deficiency anemias: Secondary | ICD-10-CM

## 2013-10-04 DIAGNOSIS — Z79899 Other long term (current) drug therapy: Secondary | ICD-10-CM

## 2013-10-04 MED ORDER — BUPROPION HCL ER (XL) 300 MG PO TB24: 300.0000 mg | ORAL_TABLET | Freq: Every day | ORAL | Status: DC

## 2013-10-04 MED ORDER — VITAMIN D (ERGOCALCIFEROL) 1.25 MG (50000 UNIT) PO CAPS: 50000.0000 [IU] | ORAL_CAPSULE | ORAL | Status: DC

## 2013-10-04 MED ORDER — FUSION PLUS PO CAPS: ORAL_CAPSULE | ORAL | Status: DC
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
## 2013-10-04 NOTE — Patient Instructions (Signed)

## 2013-10-09 LAB — COMPLETE METABOLIC PANEL WITH GFR
ALT: 13 U/L (ref 0–35)
AST: 16 U/L (ref 0–37)
Albumin: 4.1 g/dL (ref 3.5–5.2)
Alkaline Phosphatase: 44 U/L (ref 39–117)
BILIRUBIN TOTAL: 0.4 mg/dL (ref 0.3–1.2)
BUN: 10 mg/dL (ref 6–23)
CO2: 26 mEq/L (ref 19–32)
CREATININE: 0.6 mg/dL (ref 0.50–1.10)
Calcium: 9.4 mg/dL (ref 8.4–10.5)
Chloride: 106 mEq/L (ref 96–112)
GLUCOSE: 78 mg/dL (ref 70–99)
Potassium: 4.2 mEq/L (ref 3.5–5.3)
Sodium: 140 mEq/L (ref 135–145)
Total Protein: 6.9 g/dL (ref 6.0–8.3)

## 2013-10-09 LAB — CBC
HCT: 38.7 % (ref 36.0–46.0)
Hemoglobin: 12.6 g/dL (ref 12.0–15.0)
MCH: 27.3 pg (ref 26.0–34.0)
MCHC: 32.6 g/dL (ref 30.0–36.0)
MCV: 83.8 fL (ref 78.0–100.0)
Platelets: 466 10*3/uL — ABNORMAL HIGH (ref 150–400)
RBC: 4.62 MIL/uL (ref 3.87–5.11)
RDW: 14 % (ref 11.5–15.5)
WBC: 5.1 10*3/uL (ref 4.0–10.5)

## 2013-10-09 LAB — LIPID PANEL
Cholesterol: 201 mg/dL — ABNORMAL HIGH (ref 0–200)
HDL: 85 mg/dL (ref >39)
LDL CALC: 109 mg/dL — AB (ref 0–99)
TRIGLYCERIDES: 36 mg/dL (ref <150)
Total CHOL/HDL Ratio: 2.4 Ratio
VLDL: 7 mg/dL (ref 0–40)

## 2013-10-10 ENCOUNTER — Ambulatory Visit: Payer: Managed Care, Other (non HMO)

## 2013-10-10 LAB — VITAMIN D 25 HYDROXY (VIT D DEFICIENCY, FRACTURES): Vit D, 25-Hydroxy: 38 ng/mL (ref 30–89)

## 2013-10-12 ENCOUNTER — Ambulatory Visit (INDEPENDENT_AMBULATORY_CARE_PROVIDER_SITE_OTHER): Payer: Managed Care, Other (non HMO)

## 2013-10-12 DIAGNOSIS — Z1239 Encounter for other screening for malignant neoplasm of breast: Secondary | ICD-10-CM

## 2013-10-12 DIAGNOSIS — Z1231 Encounter for screening mammogram for malignant neoplasm of breast: Secondary | ICD-10-CM

## 2013-12-14 ENCOUNTER — Other Ambulatory Visit: Payer: Self-pay | Admitting: Physician Assistant

## 2014-01-09 ENCOUNTER — Other Ambulatory Visit: Payer: Self-pay | Admitting: Physician Assistant

## 2014-04-02 ENCOUNTER — Telehealth: Payer: Self-pay | Admitting: *Deleted

## 2014-04-02 MED ORDER — AMBULATORY NON FORMULARY MEDICATION: Status: DC

## 2014-04-02 NOTE — Telephone Encounter (Signed)
Per patient request, "shoe build up" rxed. In box.

## 2014-04-02 NOTE — Telephone Encounter (Signed)
Left left vm fri afternoon asking for an rx for a "shoe build up".  She states that she can take it to a shoe repair store & they will add it on there.

## 2014-04-16 ENCOUNTER — Other Ambulatory Visit: Payer: Self-pay | Admitting: Physician Assistant

## 2014-04-23 ENCOUNTER — Ambulatory Visit (INDEPENDENT_AMBULATORY_CARE_PROVIDER_SITE_OTHER): Payer: BC Managed Care – PPO | Admitting: Physician Assistant

## 2014-04-23 ENCOUNTER — Encounter: Payer: Self-pay | Admitting: Physician Assistant

## 2014-04-23 VITALS — BP 111/69 | HR 72 | Ht 62.0 in | Wt 137.0 lb

## 2014-04-23 DIAGNOSIS — F909 Attention-deficit hyperactivity disorder, unspecified type: Secondary | ICD-10-CM | POA: Diagnosis not present

## 2014-04-23 DIAGNOSIS — R29898 Other symptoms and signs involving the musculoskeletal system: Secondary | ICD-10-CM | POA: Insufficient documentation

## 2014-04-23 DIAGNOSIS — M2170 Unequal limb length (acquired), unspecified site: Secondary | ICD-10-CM

## 2014-04-23 DIAGNOSIS — M25562 Pain in left knee: Secondary | ICD-10-CM

## 2014-04-23 DIAGNOSIS — M1712 Unilateral primary osteoarthritis, left knee: Secondary | ICD-10-CM | POA: Insufficient documentation

## 2014-04-23 DIAGNOSIS — M25569 Pain in unspecified knee: Secondary | ICD-10-CM

## 2014-04-23 MED ORDER — AMBULATORY NON FORMULARY MEDICATION: Status: DC

## 2014-04-23 MED ORDER — AMPHETAMINE-DEXTROAMPHET ER 25 MG PO CP24: 25.0000 mg | ORAL_CAPSULE | ORAL | Status: DC

## 2014-04-23 NOTE — Progress Notes (Signed)
   Subjective:    Patient ID: April Vega, female    DOB: [birth year], 43 y.o.   MRN: [id number]  HPI Pt is a 43 yo female who presents to the clinic to discuss ADHD. She has struggled to find a medication and effectively works for her symptoms. She has tried and failed vyvanse, wellbutrin, and strattera. Cost is also an issue. She really struggles and work to concentrate and stay focused on a task. She often does not complete task. She would like to try something else today.   She also needs more rx for shoe buildups for left shoe. Due to her leg length discrepancy of about 1 inch it helps eliminate some of her left knee and hip pain.    Review of Systems  All other systems reviewed and are negative.      Objective:   Physical Exam  Constitutional: She is oriented to person, place, and time. She appears well-developed and well-nourished.  HENT:  Head: Normocephalic and atraumatic.  Cardiovascular: Normal rate and normal heart sounds.   Pulmonary/Chest: Effort normal and breath sounds normal. She has no wheezes.  Neurological: She is alert and oriented to person, place, and time.  Skin: Skin is dry.  Psychiatric: She has a normal mood and affect. Her behavior is normal.          Assessment & Plan:  ADHD- will try adderall. Discussed SE. If has insomnia, anxiouness, anorexia please stop medication and call office. Follow up in 1 month.   Leg length discrepancy- rx of shoe build up x5 were given for her 5 pairs of shoes. She has them in one pair now and does seem to be helping knee and hip pain on left side.

## 2014-04-27 ENCOUNTER — Telehealth: Payer: Self-pay | Admitting: *Deleted

## 2014-04-27 NOTE — Telephone Encounter (Signed)
Adderall approved 02/26/14 thru 04/27/15. Patient and pharmacy aware. Margette Fast, CMA

## 2014-06-04 ENCOUNTER — Encounter: Payer: Self-pay | Admitting: Physician Assistant

## 2014-06-04 ENCOUNTER — Ambulatory Visit (INDEPENDENT_AMBULATORY_CARE_PROVIDER_SITE_OTHER): Payer: BC Managed Care – PPO | Admitting: Physician Assistant

## 2014-06-04 VITALS — BP 113/67 | HR 82 | Ht 62.0 in | Wt 133.0 lb

## 2014-06-04 DIAGNOSIS — F909 Attention-deficit hyperactivity disorder, unspecified type: Secondary | ICD-10-CM

## 2014-06-04 MED ORDER — AMPHETAMINE-DEXTROAMPHETAMINE 20 MG PO TABS: 20.0000 mg | ORAL_TABLET | Freq: Two times a day (BID) | ORAL | Status: DC

## 2014-06-04 NOTE — Progress Notes (Signed)
   Subjective:    Patient ID: April Vega, female    DOB: 1971-06-23, 43 y.o.   MRN: [id number]  HPI Pt presents to the clinic to follow up on Adderall XR daily. She still is not happy with results. She feels like takes too long to kick in and when it does last for a couple of hours and then feels like not working again. She just feels like she is very scattered brained. Denies any SE's of medication. No insomnia, palpitations.        Review of Systems  All other systems reviewed and are negative.      Objective:   Physical Exam  Constitutional: She is oriented to person, place, and time. She appears well-developed and well-nourished.  HENT:  Head: Normocephalic and atraumatic.  Cardiovascular: Normal rate, regular rhythm and normal heart sounds.   Pulmonary/Chest: Effort normal and breath sounds normal.  Neurological: She is alert and oriented to person, place, and time.  Psychiatric: She has a normal mood and affect. Her behavior is normal.          Assessment & Plan:  ADHD- switched from extended release to immediate release adderall twice a day. Follow up in 1-2 months.

## 2014-06-06 ENCOUNTER — Encounter: Payer: Self-pay | Admitting: Physician Assistant

## 2014-06-06 ENCOUNTER — Ambulatory Visit (INDEPENDENT_AMBULATORY_CARE_PROVIDER_SITE_OTHER): Payer: Self-pay

## 2014-06-06 ENCOUNTER — Ambulatory Visit (INDEPENDENT_AMBULATORY_CARE_PROVIDER_SITE_OTHER): Payer: Self-pay | Admitting: Physician Assistant

## 2014-06-06 VITALS — BP 130/74 | HR 86 | Ht 62.0 in | Wt 135.0 lb

## 2014-06-06 DIAGNOSIS — M549 Dorsalgia, unspecified: Secondary | ICD-10-CM

## 2014-06-06 DIAGNOSIS — M545 Low back pain, unspecified: Secondary | ICD-10-CM

## 2014-06-06 DIAGNOSIS — M5412 Radiculopathy, cervical region: Secondary | ICD-10-CM

## 2014-06-06 DIAGNOSIS — M4130 Thoracogenic scoliosis, site unspecified: Secondary | ICD-10-CM

## 2014-06-06 DIAGNOSIS — M542 Cervicalgia: Secondary | ICD-10-CM

## 2014-06-06 MED ORDER — PREDNISONE 50 MG PO TABS: ORAL_TABLET | ORAL | Status: DC

## 2014-06-06 MED ORDER — MELOXICAM 15 MG PO TABS: 15.0000 mg | ORAL_TABLET | Freq: Every day | ORAL | Status: DC

## 2014-06-06 MED ORDER — CYCLOBENZAPRINE HCL 10 MG PO TABS: 10.0000 mg | ORAL_TABLET | Freq: Three times a day (TID) | ORAL | Status: DC | PRN

## 2014-06-06 NOTE — Patient Instructions (Signed)
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
## 2014-06-12 ENCOUNTER — Telehealth: Payer: Self-pay | Admitting: *Deleted

## 2014-06-12 NOTE — Telephone Encounter (Signed)
PA approved for the change in Adderall therapy.  Pharm notified.

## 2014-08-01 ENCOUNTER — Ambulatory Visit (INDEPENDENT_AMBULATORY_CARE_PROVIDER_SITE_OTHER): Payer: Self-pay | Admitting: Physician Assistant

## 2014-08-01 ENCOUNTER — Encounter: Payer: Self-pay | Admitting: Physician Assistant

## 2014-08-01 DIAGNOSIS — M6248 Contracture of muscle, other site: Secondary | ICD-10-CM

## 2014-08-01 DIAGNOSIS — M62838 Other muscle spasm: Secondary | ICD-10-CM | POA: Insufficient documentation

## 2014-08-01 DIAGNOSIS — M62830 Muscle spasm of back: Secondary | ICD-10-CM | POA: Insufficient documentation

## 2014-08-01 DIAGNOSIS — M7542 Impingement syndrome of left shoulder: Secondary | ICD-10-CM | POA: Insufficient documentation

## 2014-08-01 MED ORDER — CYCLOBENZAPRINE HCL 10 MG PO TABS: 10.0000 mg | ORAL_TABLET | Freq: Three times a day (TID) | ORAL | Status: DC | PRN

## 2014-08-01 MED ORDER — MELOXICAM 15 MG PO TABS: 15.0000 mg | ORAL_TABLET | Freq: Every day | ORAL | Status: DC
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
## 2014-08-01 NOTE — Patient Instructions (Addendum)
PT to set up.

## 2014-08-23 ENCOUNTER — Ambulatory Visit (INDEPENDENT_AMBULATORY_CARE_PROVIDER_SITE_OTHER): Payer: Self-pay | Admitting: Physical Therapy

## 2014-08-23 DIAGNOSIS — M2550 Pain in unspecified joint: Secondary | ICD-10-CM

## 2014-08-23 DIAGNOSIS — M62838 Other muscle spasm: Secondary | ICD-10-CM

## 2014-08-23 DIAGNOSIS — M6248 Contracture of muscle, other site: Secondary | ICD-10-CM

## 2014-08-23 DIAGNOSIS — M62830 Muscle spasm of back: Secondary | ICD-10-CM

## 2014-08-29 ENCOUNTER — Other Ambulatory Visit: Payer: Self-pay | Admitting: *Deleted

## 2014-08-29 MED ORDER — AMPHETAMINE-DEXTROAMPHETAMINE 20 MG PO TABS: 20.0000 mg | ORAL_TABLET | Freq: Two times a day (BID) | ORAL | Status: DC

## 2014-08-30 ENCOUNTER — Encounter (INDEPENDENT_AMBULATORY_CARE_PROVIDER_SITE_OTHER): Payer: BC Managed Care – PPO | Admitting: Physical Therapy

## 2014-08-30 DIAGNOSIS — M62830 Muscle spasm of back: Secondary | ICD-10-CM

## 2014-08-30 DIAGNOSIS — M62838 Other muscle spasm: Secondary | ICD-10-CM

## 2014-08-30 DIAGNOSIS — M2550 Pain in unspecified joint: Secondary | ICD-10-CM

## 2014-08-30 DIAGNOSIS — M6248 Contracture of muscle, other site: Secondary | ICD-10-CM

## 2014-09-06 ENCOUNTER — Encounter: Payer: BC Managed Care – PPO | Admitting: Physical Therapy

## 2014-09-10 ENCOUNTER — Ambulatory Visit (INDEPENDENT_AMBULATORY_CARE_PROVIDER_SITE_OTHER): Payer: BC Managed Care – PPO | Admitting: Physician Assistant

## 2014-09-10 ENCOUNTER — Encounter: Payer: Self-pay | Admitting: Physician Assistant

## 2014-09-10 ENCOUNTER — Other Ambulatory Visit: Payer: BC Managed Care – PPO | Admitting: Physician Assistant

## 2014-09-10 ENCOUNTER — Encounter (INDEPENDENT_AMBULATORY_CARE_PROVIDER_SITE_OTHER): Payer: Self-pay | Admitting: Physical Therapy

## 2014-09-10 VITALS — BP 124/83 | HR 91 | Ht 62.0 in | Wt 137.0 lb

## 2014-09-10 DIAGNOSIS — M62830 Muscle spasm of back: Secondary | ICD-10-CM

## 2014-09-10 DIAGNOSIS — F900 Attention-deficit hyperactivity disorder, predominantly inattentive type: Secondary | ICD-10-CM | POA: Diagnosis not present

## 2014-09-10 DIAGNOSIS — D509 Iron deficiency anemia, unspecified: Secondary | ICD-10-CM | POA: Insufficient documentation

## 2014-09-10 DIAGNOSIS — M2550 Pain in unspecified joint: Secondary | ICD-10-CM

## 2014-09-10 DIAGNOSIS — M6248 Contracture of muscle, other site: Secondary | ICD-10-CM

## 2014-09-10 DIAGNOSIS — F909 Attention-deficit hyperactivity disorder, unspecified type: Secondary | ICD-10-CM

## 2014-09-10 DIAGNOSIS — E559 Vitamin D deficiency, unspecified: Secondary | ICD-10-CM | POA: Insufficient documentation

## 2014-09-10 DIAGNOSIS — M62838 Other muscle spasm: Secondary | ICD-10-CM

## 2014-09-10 MED ORDER — LISDEXAMFETAMINE DIMESYLATE 70 MG PO CAPS: 70.0000 mg | ORAL_CAPSULE | Freq: Every day | ORAL | Status: DC

## 2014-09-10 MED ORDER — FUSION PLUS PO CAPS: ORAL_CAPSULE | ORAL | Status: DC

## 2014-09-10 NOTE — Progress Notes (Signed)
   Subjective:    Patient ID: April Vega, female    DOB: October 25, 1970, 43 y.o.   MRN: [id number]  HPI Pt presents to the clinic to follow up on ADHD. We tried adderall immediate release twice a day she did not get benefit with this. vyvanse per pt helped the most but still has lots of problems with focus. She just doesn't feel effective at work.   She would like iron and vit D rechecked. She is not taking fusion because ran out of rx but taking oral iron 325mg  daily and vitamin D 1000 units daily.    Review of Systems  All other systems reviewed and are negative.      Objective:   Physical Exam  Constitutional: She is oriented to person, place, and time. She appears well-developed and well-nourished.  HENT:  Head: Normocephalic and atraumatic.  Cardiovascular: Normal rate, regular rhythm and normal heart sounds.   Pulmonary/Chest: Effort normal and breath sounds normal. She has no wheezes.  Neurological: She is alert and oriented to person, place, and time.  Skin: Skin is dry.  Psychiatric: She has a normal mood and affect. Her behavior is normal.          Assessment & Plan:  Adult ADHD- at this point need to refer out for management we have tried many different options without benefit. Pt did have formal work up at [facility] for ADHD but do not have report. Have asked pt to bring in to have scan into system. Referral made to Dr. Rachel Moulds. Restarted vyvanse the medication that has had best results.   IDA- will recheck since been on OTC iron.   Vitamin D insuffiency- will recheck today. Continue on oral 1000units until we let you know what level is.

## 2014-09-10 NOTE — Patient Instructions (Signed)
[facility] neurology.  ADHD specialist referral.

## 2014-09-11 LAB — CBC WITH DIFFERENTIAL/PLATELET
BASOS ABS: 0.2 10*3/uL — AB (ref 0.0–0.1)
Basophils Relative: 3 % — ABNORMAL HIGH (ref 0–1)
EOS PCT: 11 % — AB (ref 0–5)
Eosinophils Absolute: 0.6 10*3/uL (ref 0.0–0.7)
HCT: 38.9 % (ref 36.0–46.0)
Hemoglobin: 12.7 g/dL (ref 12.0–15.0)
LYMPHS PCT: 34 % (ref 12–46)
Lymphs Abs: 1.7 10*3/uL (ref 0.7–4.0)
MCH: 27.7 pg (ref 26.0–34.0)
MCHC: 32.6 g/dL (ref 30.0–36.0)
MCV: 84.7 fL (ref 78.0–100.0)
MPV: 9.4 fL (ref 9.4–12.4)
Monocytes Absolute: 0.6 10*3/uL (ref 0.1–1.0)
Monocytes Relative: 11 % (ref 3–12)
NEUTROS ABS: 2.1 10*3/uL (ref 1.7–7.7)
Neutrophils Relative %: 41 % — ABNORMAL LOW (ref 43–77)
PLATELETS: 556 10*3/uL — AB (ref 150–400)
RBC: 4.59 MIL/uL (ref 3.87–5.11)
RDW: 13.7 % (ref 11.5–15.5)
WBC: 5 10*3/uL (ref 4.0–10.5)

## 2014-09-11 LAB — VITAMIN D 25 HYDROXY (VIT D DEFICIENCY, FRACTURES): Vit D, 25-Hydroxy: 17 ng/mL — ABNORMAL LOW (ref 30–100)

## 2014-09-11 LAB — IRON AND TIBC
%SAT: 12 % — ABNORMAL LOW (ref 20–55)
Iron: 41 ug/dL — ABNORMAL LOW (ref 42–145)
TIBC: 349 ug/dL (ref 250–470)
UIBC: 308 ug/dL (ref 125–400)

## 2014-09-11 LAB — FERRITIN: Ferritin: 10 ng/mL (ref 10–291)

## 2014-09-12 ENCOUNTER — Telehealth: Payer: Self-pay | Admitting: *Deleted

## 2014-09-12 ENCOUNTER — Encounter: Payer: BC Managed Care – PPO | Admitting: Physical Therapy

## 2014-09-12 ENCOUNTER — Other Ambulatory Visit: Payer: Self-pay | Admitting: Physician Assistant

## 2014-09-12 MED ORDER — VITAMIN D (ERGOCALCIFEROL) 1.25 MG (50000 UNIT) PO CAPS: ORAL_CAPSULE | ORAL | Status: DC

## 2014-09-12 NOTE — Telephone Encounter (Signed)
Pt called back after I spoke with her to go over her lab results & left a vm stating that she would like for you to go ahead & place an allergy referral for her.

## 2014-09-16 ENCOUNTER — Other Ambulatory Visit: Payer: Self-pay | Admitting: Physician Assistant

## 2014-09-16 DIAGNOSIS — R898 Other abnormal findings in specimens from other organs, systems and tissues: Secondary | ICD-10-CM

## 2014-09-16 DIAGNOSIS — D721 Eosinophilia: Principal | ICD-10-CM

## 2014-09-16 NOTE — Telephone Encounter (Signed)
Referral placed.

## 2014-09-17 ENCOUNTER — Telehealth: Payer: Self-pay | Admitting: *Deleted

## 2014-09-17 ENCOUNTER — Encounter (INDEPENDENT_AMBULATORY_CARE_PROVIDER_SITE_OTHER): Payer: Self-pay | Admitting: Physical Therapy

## 2014-09-17 ENCOUNTER — Encounter: Payer: BC Managed Care – PPO | Admitting: Physical Therapy

## 2014-09-17 DIAGNOSIS — M62830 Muscle spasm of back: Secondary | ICD-10-CM

## 2014-09-17 DIAGNOSIS — M2550 Pain in unspecified joint: Secondary | ICD-10-CM

## 2014-09-17 DIAGNOSIS — M6248 Contracture of muscle, other site: Secondary | ICD-10-CM

## 2014-09-17 DIAGNOSIS — M62838 Other muscle spasm: Secondary | ICD-10-CM

## 2014-09-17 NOTE — Telephone Encounter (Signed)
Vyvanse prior approval. Pharmacy notified.

## 2014-09-18 NOTE — Telephone Encounter (Signed)
Pt notified of PA approval

## 2014-09-19 ENCOUNTER — Encounter (INDEPENDENT_AMBULATORY_CARE_PROVIDER_SITE_OTHER): Payer: Self-pay | Admitting: Physical Therapy

## 2014-09-19 DIAGNOSIS — M62830 Muscle spasm of back: Secondary | ICD-10-CM

## 2014-09-19 DIAGNOSIS — M6248 Contracture of muscle, other site: Secondary | ICD-10-CM

## 2014-09-19 DIAGNOSIS — M2550 Pain in unspecified joint: Secondary | ICD-10-CM

## 2014-09-19 DIAGNOSIS — M62838 Other muscle spasm: Secondary | ICD-10-CM

## 2014-09-20 ENCOUNTER — Encounter: Payer: BC Managed Care – PPO | Admitting: Physical Therapy

## 2014-09-27 ENCOUNTER — Encounter (INDEPENDENT_AMBULATORY_CARE_PROVIDER_SITE_OTHER): Payer: Self-pay | Admitting: Physical Therapy

## 2014-09-27 DIAGNOSIS — M62830 Muscle spasm of back: Secondary | ICD-10-CM

## 2014-09-27 DIAGNOSIS — M2550 Pain in unspecified joint: Secondary | ICD-10-CM

## 2014-09-27 DIAGNOSIS — M6248 Contracture of muscle, other site: Secondary | ICD-10-CM

## 2014-09-27 DIAGNOSIS — M62838 Other muscle spasm: Secondary | ICD-10-CM

## 2014-10-04 ENCOUNTER — Encounter (INDEPENDENT_AMBULATORY_CARE_PROVIDER_SITE_OTHER): Payer: Self-pay | Admitting: Physical Therapy

## 2014-10-04 DIAGNOSIS — M62838 Other muscle spasm: Secondary | ICD-10-CM

## 2014-10-04 DIAGNOSIS — M62830 Muscle spasm of back: Secondary | ICD-10-CM

## 2014-10-04 DIAGNOSIS — M2550 Pain in unspecified joint: Secondary | ICD-10-CM

## 2014-10-04 DIAGNOSIS — M6248 Contracture of muscle, other site: Secondary | ICD-10-CM

## 2014-10-11 ENCOUNTER — Encounter: Payer: BC Managed Care – PPO | Admitting: Physical Therapy

## 2014-10-15 ENCOUNTER — Other Ambulatory Visit: Payer: Self-pay | Admitting: Physician Assistant

## 2014-10-15 DIAGNOSIS — D721 Eosinophilia: Principal | ICD-10-CM

## 2014-10-15 DIAGNOSIS — R898 Other abnormal findings in specimens from other organs, systems and tissues: Secondary | ICD-10-CM | POA: Insufficient documentation

## 2014-10-15 MED ORDER — CETIRIZINE HCL 10 MG PO TABS: 10.0000 mg | ORAL_TABLET | Freq: Every day | ORAL | Status: DC

## 2014-10-18 ENCOUNTER — Encounter (INDEPENDENT_AMBULATORY_CARE_PROVIDER_SITE_OTHER): Payer: BC Managed Care – PPO | Admitting: Physical Therapy

## 2014-10-18 DIAGNOSIS — M6248 Contracture of muscle, other site: Secondary | ICD-10-CM

## 2014-10-18 DIAGNOSIS — M2550 Pain in unspecified joint: Secondary | ICD-10-CM

## 2014-10-18 DIAGNOSIS — M62830 Muscle spasm of back: Secondary | ICD-10-CM

## 2014-10-18 DIAGNOSIS — M62838 Other muscle spasm: Secondary | ICD-10-CM

## 2014-10-25 ENCOUNTER — Encounter: Payer: Self-pay | Admitting: Physical Therapy

## 2014-10-29 ENCOUNTER — Other Ambulatory Visit: Payer: Self-pay | Admitting: Physician Assistant

## 2014-12-12 ENCOUNTER — Encounter: Payer: Self-pay | Admitting: Physician Assistant

## 2014-12-17 ENCOUNTER — Encounter: Payer: Self-pay | Admitting: Physician Assistant

## 2014-12-17 ENCOUNTER — Ambulatory Visit (INDEPENDENT_AMBULATORY_CARE_PROVIDER_SITE_OTHER): Payer: Federal, State, Local not specified - PPO | Admitting: Physician Assistant

## 2014-12-17 VITALS — BP 117/73 | HR 90 | Ht 62.0 in | Wt 142.0 lb

## 2014-12-17 DIAGNOSIS — Z0000 Encounter for general adult medical examination without abnormal findings: Secondary | ICD-10-CM

## 2014-12-17 DIAGNOSIS — R41840 Attention and concentration deficit: Secondary | ICD-10-CM | POA: Insufficient documentation

## 2014-12-17 DIAGNOSIS — D649 Anemia, unspecified: Secondary | ICD-10-CM

## 2014-12-17 DIAGNOSIS — Z91048 Other nonmedicinal substance allergy status: Secondary | ICD-10-CM

## 2014-12-17 DIAGNOSIS — R413 Other amnesia: Secondary | ICD-10-CM | POA: Diagnosis not present

## 2014-12-17 DIAGNOSIS — Z131 Encounter for screening for diabetes mellitus: Secondary | ICD-10-CM

## 2014-12-17 DIAGNOSIS — Z9109 Other allergy status, other than to drugs and biological substances: Secondary | ICD-10-CM | POA: Insufficient documentation

## 2014-12-17 DIAGNOSIS — R635 Abnormal weight gain: Secondary | ICD-10-CM

## 2014-12-17 DIAGNOSIS — K219 Gastro-esophageal reflux disease without esophagitis: Secondary | ICD-10-CM

## 2014-12-17 MED ORDER — BUPROPION HCL ER (XL) 150 MG PO TB24: 150.0000 mg | ORAL_TABLET | ORAL | Status: DC

## 2014-12-17 MED ORDER — ESCITALOPRAM OXALATE 10 MG PO TABS: 10.0000 mg | ORAL_TABLET | Freq: Every day | ORAL | Status: DC

## 2014-12-17 MED ORDER — MONTELUKAST SODIUM 10 MG PO TABS: 10.0000 mg | ORAL_TABLET | Freq: Every day | ORAL | Status: DC

## 2014-12-17 NOTE — Patient Instructions (Signed)

## 2014-12-17 NOTE — Progress Notes (Signed)
Subjective:    Patient ID: April Vega, female    DOB: 1971/03/19, 44 y.o.   MRN: [id number]  HPI    Review of Systems     Objective:   Physical Exam        Assessment & Plan:   Subjective:     April Vega is a 44 y.o. female and is here for a comprehensive physical exam. The patient reports problems - pt continues to have problems with memory, inattention and work. she was recently seen by neurophyscologist that confirmed she did not think pt has ADHD and likely why stimulant was not working. pt did not want  report to be scanned into chart because wanted neurologist(a new one) to reevaluate. she continues to have memory issues and inattention issues. she has done well with school and at work with grades and performance. she needs refill on GERD medication. [name]  History   Social History  . Marital Status: Married    Spouse Name: N/A  . Number of Children: N/A  . Years of Education: N/A   Occupational History  . Not on file.   Social History Main Topics  . Smoking status: Never Smoker   . Smokeless tobacco: Not on file  . Alcohol Use: Not on file  . Drug Use: Not on file  . Sexual Activity: Not on file   Other Topics Concern  . Not on file   Social History Narrative   Health Maintenance  Topic Date Due  . HIV Screening  11/30/1983  . INFLUENZA VACCINE  04/22/2015  . MAMMOGRAM  04/22/2015  . PAP SMEAR  10/04/2016  . TETANUS/TDAP  10/16/2020    The following portions of the patient's history were reviewed and updated as appropriate: allergies, current medications, past family history, past medical history, past social history, past surgical history and problem list.  Review of Systems A comprehensive review of systems was negative.   Objective:    BP 117/73 mmHg  Pulse 90  Ht 5\' 2"  (1.575 m)  Wt 142 lb (64.411 kg)  BMI 25.97 kg/m2 General appearance: alert, cooperative and appears stated age Head: Normocephalic, without obvious abnormality,  atraumatic Eyes: conjunctivae/corneas clear. PERRL, EOM's intact. Fundi benign. Ears: normal TM's and external ear canals both ears Nose: Nares normal. Septum midline. Mucosa normal. No drainage or sinus tenderness. Throat: lips, mucosa, and tongue normal; teeth and gums normal Neck: no adenopathy, no carotid bruit, no JVD, supple, symmetrical, trachea midline and thyroid not enlarged, symmetric, no tenderness/mass/nodules Back: symmetric, no curvature. ROM normal. No CVA tenderness. Lungs: clear to auscultation bilaterally Heart: regular rate and rhythm, S1, S2 normal, no murmur, click, rub or gallop Abdomen: soft, non-tender; bowel sounds normal; no masses,  no organomegaly Extremities: extremities normal, atraumatic, no cyanosis or edema Pulses: 2+ and symmetric Skin: Skin color, texture, turgor normal. No rashes or lesions Lymph nodes: Cervical, supraclavicular, and axillary nodes normal. Neurologic: Grossly normal    Assessment:    Healthy female exam.      Plan:    CPE- mammogram and pap up to date. Will get fasting labs. Calcium and vitamin D discussed. Will recheck vitamin d.   Anemia- will recheck labs. Continue with fusion.   Memory deficit/inattention- unsure of etiology. Certainly stimulants due to her hx have not been beneficial. Do not want to continue doing something that does not work. Consider anxiety causing some focus issues. Will start wellbutrin 150mg  daily with lexapro 10mg  daily. Follow up in 4-6 weeks. Will labs  for causes of memory issues b12, vit D, magnesium, TSH. Will make referral at request of patient.   GERD- nexium sent to pharmacy to try.  See After Visit Summary for Counseling Recommendations

## 2014-12-18 LAB — CBC WITH DIFFERENTIAL/PLATELET
BASOS ABS: 0.2 10*3/uL — AB (ref 0.0–0.1)
Basophils Relative: 3 % — ABNORMAL HIGH (ref 0–1)
EOS ABS: 0.5 10*3/uL (ref 0.0–0.7)
Eosinophils Relative: 9 % — ABNORMAL HIGH (ref 0–5)
HCT: 39.9 % (ref 36.0–46.0)
Hemoglobin: 12.7 g/dL (ref 12.0–15.0)
LYMPHS PCT: 31 % (ref 12–46)
Lymphs Abs: 1.7 10*3/uL (ref 0.7–4.0)
MCH: 27.3 pg (ref 26.0–34.0)
MCHC: 31.8 g/dL (ref 30.0–36.0)
MCV: 85.8 fL (ref 78.0–100.0)
MPV: 9.4 fL (ref 8.6–12.4)
Monocytes Absolute: 0.6 10*3/uL (ref 0.1–1.0)
Monocytes Relative: 10 % (ref 3–12)
Neutro Abs: 2.6 10*3/uL (ref 1.7–7.7)
Neutrophils Relative %: 47 % (ref 43–77)
PLATELETS: 474 10*3/uL — AB (ref 150–400)
RBC: 4.65 MIL/uL (ref 3.87–5.11)
RDW: 14.8 % (ref 11.5–15.5)
WBC: 5.6 10*3/uL (ref 4.0–10.5)

## 2014-12-18 LAB — COMPLETE METABOLIC PANEL WITH GFR
ALT: 11 U/L (ref 0–35)
AST: 14 U/L (ref 0–37)
Albumin: 3.9 g/dL (ref 3.5–5.2)
Alkaline Phosphatase: 45 U/L (ref 39–117)
BUN: 11 mg/dL (ref 6–23)
CALCIUM: 9.3 mg/dL (ref 8.4–10.5)
CO2: 25 mEq/L (ref 19–32)
Chloride: 108 mEq/L (ref 96–112)
Creat: 0.53 mg/dL (ref 0.50–1.10)
GFR, Est Non African American: >89 mL/min
Glucose, Bld: 80 mg/dL (ref 70–99)
Potassium: 4.2 mEq/L (ref 3.5–5.3)
Sodium: 138 mEq/L (ref 135–145)
Total Bilirubin: 0.4 mg/dL (ref 0.2–1.2)
Total Protein: 6.9 g/dL (ref 6.0–8.3)

## 2014-12-18 LAB — TSH: TSH: 0.996 u[IU]/mL (ref 0.350–4.500)

## 2014-12-18 LAB — LIPID PANEL
CHOL/HDL RATIO: 2.5 ratio
Cholesterol: 167 mg/dL (ref 0–200)
HDL: 67 mg/dL (ref >=46)
LDL CALC: 90 mg/dL (ref 0–99)
Triglycerides: 48 mg/dL (ref <150)
VLDL: 10 mg/dL (ref 0–40)

## 2014-12-18 LAB — MAGNESIUM: Magnesium: 1.8 mg/dL (ref 1.5–2.5)

## 2014-12-18 LAB — FERRITIN: Ferritin: 13 ng/mL (ref 10–291)

## 2014-12-18 LAB — VITAMIN B12: Vitamin B-12: 389 pg/mL (ref 211–911)

## 2014-12-19 ENCOUNTER — Other Ambulatory Visit: Payer: Self-pay | Admitting: *Deleted

## 2014-12-19 ENCOUNTER — Telehealth: Payer: Self-pay | Admitting: *Deleted

## 2014-12-19 LAB — VITAMIN D 25 HYDROXY (VIT D DEFICIENCY, FRACTURES): Vit D, 25-Hydroxy: 39 ng/mL (ref 30–100)

## 2014-12-19 MED ORDER — ESOMEPRAZOLE MAGNESIUM 40 MG PO CPDR: 40.0000 mg | DELAYED_RELEASE_CAPSULE | Freq: Every day | ORAL | Status: DC

## 2014-12-19 NOTE — Telephone Encounter (Signed)
Pt left vm stating that she is having stomach problems and wanted to know if you could send something in.  Per her vm, she's tried nexium in the past.

## 2014-12-19 NOTE — Telephone Encounter (Signed)
Rx sent pt notified 

## 2014-12-19 NOTE — Telephone Encounter (Signed)
[place] for nexium 40mg  #30 i po qd RF 6

## 2014-12-22 DIAGNOSIS — K219 Gastro-esophageal reflux disease without esophagitis: Secondary | ICD-10-CM | POA: Insufficient documentation

## 2014-12-24 ENCOUNTER — Encounter: Payer: Self-pay | Admitting: Physician Assistant

## 2014-12-25 ENCOUNTER — Telehealth: Payer: Self-pay | Admitting: *Deleted

## 2014-12-25 NOTE — Telephone Encounter (Signed)
See past note.

## 2014-12-25 NOTE — Telephone Encounter (Signed)
b12 389 lower normal range. Likely not deficient and does not qualitfy for injections unless there is no response to oral supplement  Magnesium also low normal 1.8 in a range of 1.5-2.5 range. Certainly try oral supplement to see if helps with any of your symptoms.

## 2014-12-25 NOTE — Telephone Encounter (Signed)
Patient called stating that she needs "her #'s for magnesium & B-12 levels Also requesting getting a B-12 shot then going on the supplements

## 2014-12-25 NOTE — Telephone Encounter (Signed)
Patient called back stating that she got my message but didn't receive the information she wanted. I read to her the message that [employer] corespond on 3/30. Patient said" her magnesium & b-12 levels are low & she wants to the know the # that represents low" And "that she wants to come in for b-12 injections. Would that be ok

## 2015-01-18 ENCOUNTER — Other Ambulatory Visit: Payer: Self-pay | Admitting: Physician Assistant

## 2015-01-18 MED ORDER — ESCITALOPRAM OXALATE 20 MG PO TABS: 20.0000 mg | ORAL_TABLET | Freq: Every day | ORAL | Status: DC

## 2015-03-29 ENCOUNTER — Ambulatory Visit: Payer: Federal, State, Local not specified - PPO | Admitting: Sports Medicine

## 2015-05-06 ENCOUNTER — Other Ambulatory Visit: Payer: Self-pay | Admitting: Physician Assistant

## 2015-05-27 ENCOUNTER — Other Ambulatory Visit: Payer: Self-pay | Admitting: Physician Assistant

## 2015-06-10 ENCOUNTER — Encounter: Payer: Self-pay | Admitting: Physician Assistant

## 2015-06-10 ENCOUNTER — Ambulatory Visit (INDEPENDENT_AMBULATORY_CARE_PROVIDER_SITE_OTHER): Payer: Federal, State, Local not specified - PPO | Admitting: Physician Assistant

## 2015-06-10 VITALS — BP 121/83 | HR 92 | Ht 62.0 in | Wt 139.0 lb

## 2015-06-10 DIAGNOSIS — F411 Generalized anxiety disorder: Secondary | ICD-10-CM

## 2015-06-10 DIAGNOSIS — E538 Deficiency of other specified B group vitamins: Secondary | ICD-10-CM

## 2015-06-10 DIAGNOSIS — D509 Iron deficiency anemia, unspecified: Secondary | ICD-10-CM | POA: Diagnosis not present

## 2015-06-10 DIAGNOSIS — F329 Major depressive disorder, single episode, unspecified: Secondary | ICD-10-CM

## 2015-06-10 DIAGNOSIS — F32A Depression, unspecified: Secondary | ICD-10-CM

## 2015-06-10 DIAGNOSIS — E559 Vitamin D deficiency, unspecified: Secondary | ICD-10-CM

## 2015-06-10 MED ORDER — VILAZODONE HCL 20 MG PO TABS: ORAL_TABLET | ORAL | Status: DC

## 2015-06-10 NOTE — Patient Instructions (Signed)
Take 1/2 lexapro and 1/2 wellbutrin with 1/2 viibryd for 7 days. Then stop lexapro and wellbutrin and increase to full tablet for viibryd.

## 2015-06-11 LAB — CBC WITH DIFFERENTIAL/PLATELET
Basophils Absolute: 0.1 10*3/uL (ref 0.0–0.1)
Basophils Relative: 1 % (ref 0–1)
EOS ABS: 0.4 10*3/uL (ref 0.0–0.7)
EOS PCT: 6 % — AB (ref 0–5)
HCT: 39 % (ref 36.0–46.0)
Hemoglobin: 12.5 g/dL (ref 12.0–15.0)
LYMPHS ABS: 2.4 10*3/uL (ref 0.7–4.0)
Lymphocytes Relative: 38 % (ref 12–46)
MCH: 27.2 pg (ref 26.0–34.0)
MCHC: 32.1 g/dL (ref 30.0–36.0)
MCV: 85 fL (ref 78.0–100.0)
MONOS PCT: 11 % (ref 3–12)
MPV: 9.5 fL (ref 8.6–12.4)
Monocytes Absolute: 0.7 10*3/uL (ref 0.1–1.0)
Neutro Abs: 2.7 10*3/uL (ref 1.7–7.7)
Neutrophils Relative %: 44 % (ref 43–77)
PLATELETS: 490 10*3/uL — AB (ref 150–400)
RBC: 4.59 MIL/uL (ref 3.87–5.11)
RDW: 14.1 % (ref 11.5–15.5)
WBC: 6.2 10*3/uL (ref 4.0–10.5)

## 2015-06-11 NOTE — Progress Notes (Signed)
   Subjective:    Patient ID: April Vega, female    DOB: [birth year], 44 y.o.   MRN: [id number]  HPI  Patient presents to the clinic to follow-up on anxiety and vitamin deficiencies. She is taken iron/B12/magnesium/vitamin D. She was last put on Wellbutrin and Lexapro. She does not believe this combination is working for her. She feels that Wellbutrin is not doing anything for her. She would like to try something else. She continues to have an attention despite workup that showed no signs of ADHD. She continues to excel at work and be able to get things done. She recently got a promotion. She denies any suicidal or homicidal thoughts. She reports that when she is around people she is fine it is just when she is alone but she is anxious and down.    Review of Systems  All other systems reviewed and are negative.      Objective:   Physical Exam  Constitutional: She is oriented to person, place, and time. She appears well-developed and well-nourished.  HENT:  Head: Normocephalic and atraumatic.  Cardiovascular: Normal rate, regular rhythm and normal heart sounds.   Pulmonary/Chest: Effort normal and breath sounds normal.  Neurological: She is alert and oriented to person, place, and time.  Psychiatric: She has a normal mood and affect. Her behavior is normal.          Assessment & Plan:  GADdepression- PHQ-9 was 7. GAD-7 was 7. Patient is just not happy with her current anxiety and depression treatment. She would like to try something else. I discussed Viibrd and Effexor as both options to try. She would like to start with Viibryd. Discussed tapering up on fibroid while tapering down on Wellbutrin and Lexapro. Coupon card was given today. Follow up in 4-6 weeks.   Vitamin D deficiency/vitamin B12-we'll recheck labs today. Patient requested recheck in magnesium. She has started a magnesium supplement.

## 2015-06-12 LAB — VITAMIN D 25 HYDROXY (VIT D DEFICIENCY, FRACTURES): Vit D, 25-Hydroxy: 18 ng/mL — ABNORMAL LOW (ref 30–100)

## 2015-06-12 LAB — IRON AND TIBC
%SAT: 8 % — AB (ref 11–50)
Iron: 24 ug/dL — ABNORMAL LOW (ref 40–190)
TIBC: 308 ug/dL (ref 250–450)
UIBC: 284 ug/dL (ref 125–400)

## 2015-06-12 LAB — VITAMIN B12: VITAMIN B 12: 1526 pg/mL — AB (ref 211–911)

## 2015-06-12 LAB — MAGNESIUM: MAGNESIUM: 2.1 mg/dL (ref 1.5–2.5)

## 2015-06-12 LAB — FERRITIN: Ferritin: 13 ng/mL (ref 10–291)

## 2015-06-13 ENCOUNTER — Other Ambulatory Visit: Payer: Self-pay | Admitting: *Deleted

## 2015-06-13 MED ORDER — VITAMIN D (ERGOCALCIFEROL) 1.25 MG (50000 UNIT) PO CAPS: ORAL_CAPSULE | ORAL | Status: DC

## 2015-07-04 ENCOUNTER — Other Ambulatory Visit: Payer: Self-pay | Admitting: Physician Assistant

## 2015-07-17 ENCOUNTER — Ambulatory Visit: Payer: Federal, State, Local not specified - PPO | Admitting: Physician Assistant

## 2015-07-17 ENCOUNTER — Encounter: Payer: Self-pay | Admitting: Emergency Medicine

## 2015-07-17 ENCOUNTER — Emergency Department
Admission: EM | Admit: 2015-07-17 | Discharge: 2015-07-17 | Disposition: A | Discharge status: Home / Self Care | Payer: Federal, State, Local not specified - PPO | Source: Home / Self Care | Attending: Family Medicine | Admitting: Family Medicine

## 2015-07-17 DIAGNOSIS — L298 Other pruritus: Secondary | ICD-10-CM | POA: Diagnosis not present

## 2015-07-17 DIAGNOSIS — N898 Other specified noninflammatory disorders of vagina: Secondary | ICD-10-CM

## 2015-07-17 DIAGNOSIS — R300 Dysuria: Secondary | ICD-10-CM

## 2015-07-17 LAB — POCT URINALYSIS DIP (MANUAL ENTRY)
Bilirubin, UA: NEGATIVE
Glucose, UA: NEGATIVE
Ketones, POC UA: NEGATIVE
Nitrite, UA: NEGATIVE
Spec Grav, UA: >=1.03 (ref 1.005–1.03)
Urobilinogen, UA: 0.2 (ref 0–1)
pH, UA: 5.5 (ref 5–8)

## 2015-07-17 NOTE — ED Notes (Signed)
Reports 3 day history of clear vaginal discharge that seems itchy; today some dysuria and frequency.

## 2015-07-17 NOTE — ED Provider Notes (Signed)
CSN: [id number]     Arrival date & time 07/17/15  1621 History   First MD Initiated Contact with Patient 07/17/15 1634     Chief Complaint  Patient presents with  . Vaginal Discharge  . Dysuria  . Urinary Frequency   (Consider location/radiation/quality/duration/timing/severity/associated sxs/prior Treatment) HPI  Pt is a 44yo female presenting to [facility] with c/o vaginal discharge, urinary frequency, and dysuria for 3 days.  Pt also reports noticing small non-tender bumps on outside of vaginal opening.  Pt c/o mild itching and irritation that is worse when she pees. States she has a mild burning sensation after urinating. She started OTC 7 day course of monostat yesterday but is worried that worsened symptoms.  Denies fever, chills, n/v/d. Denies abdominal pain or lower back pain.  Pt does report having intercourse but no new partners. Questionable concern for STDs.   Past Medical History  Diagnosis Date  . ADHD (attention deficit hyperactivity disorder)    Past Surgical History  Procedure Laterality Date  . Bone graph  01/1990   Family History  Problem Relation Age of Onset  . Diabetes Mother   . Hypertension Mother    Social History  Substance Use Topics  . Smoking status: Never Smoker   . Smokeless tobacco: None  . Alcohol Use: None   OB History    No data available     Review of Systems  Constitutional: Negative for fever and chills.  Gastrointestinal: Negative for nausea, vomiting, abdominal pain and diarrhea.  Genitourinary: Positive for dysuria, urgency, frequency, vaginal discharge, genital sores and vaginal pain. Negative for hematuria, flank pain, decreased urine volume and vaginal bleeding.  Musculoskeletal: Negative for myalgias and back pain.    Allergies  Latex  Home Medications   Prior to Admission medications   Medication Sig Start Date End Date Taking? Authorizing Provider  buPROPion (WELLBUTRIN XL) 300 MG 24 hr tablet TAKE 1 TABLET (300 MG TOTAL) BY  MOUTH DAILY. 05/07/15   Jade L [name], PA-C  escitalopram (LEXAPRO) 20 MG tablet Take 1 tablet (20 mg total) by mouth daily. Patient needs to schedule a follow up appointment before more refills. 05/30/15   Jade L [name], PA-C  Iron-FA-B Cmp-C-Biot-Probiotic (FUSION PLUS) CAPS 1 capsule a day. 09/10/14   Jade L [name], PA-C  magnesium gluconate (MAGONATE) 500 MG tablet Take 500 mg by mouth 2 (two) times daily.    Historical Provider, MD  montelukast (SINGULAIR) 10 MG tablet Take 1 tablet (10 mg total) by mouth at bedtime. 12/17/14   Jade L [name], PA-C  Vilazodone HCl 20 MG TABS Take 20mg  tablet daily. 06/10/15   Jade L [name], PA-C  Vitamin D, Ergocalciferol, (DRISDOL) 50000 UNITS CAPS capsule TAKE 1 CAPSULE (50,000 UNITS TOTAL) BY MOUTH EVERY 7 (SEVEN) DAYS. 06/13/15   Donella Stade, PA-C   Meds Ordered and Administered this Visit  Medications - No data to display  BP 119/78 mmHg  Pulse 89  Temp(Src) 98.7 F (37.1 C) (Oral)  Resp 16  Ht 5\' 4"  (1.626 m)  Wt 145 lb (65.772 kg)  BMI 24.88 kg/m2  SpO2 99%  LMP 06/28/2015 (Exact Date) No data found.   Physical Exam  Constitutional: She appears well-developed and well-nourished. No distress.  HENT:  Head: Normocephalic and atraumatic.  Eyes: Conjunctivae are normal. No scleral icterus.  Neck: Normal range of motion.  Cardiovascular: Normal rate, regular rhythm and normal heart sounds.   Pulmonary/Chest: Effort normal and breath sounds normal. No respiratory distress. She has  no wheezes. She has no rales. She exhibits no tenderness.  Abdominal: Soft. She exhibits no distension and no mass. There is no tenderness. There is no rebound and no guarding.  Genitourinary: No labial fusion. There is lesion on the right labia. There is no rash, tenderness or injury on the right labia. There is lesion on the left labia. There is no rash, tenderness or injury on the left labia. Cervix exhibits no motion tenderness, no discharge and no  friability. Right adnexum displays no mass, no tenderness and no fullness. Left adnexum displays no mass, no tenderness and no fullness. Vaginal discharge found.  Chaperoned exam. External- multiple hypopigmented pea-sized circular lesions, non-tender. No bleeding or discharge.  Vaginal canal: small amount of white thin discharge. Scant red blood. No CMT, adnexal tenderness or masses   Musculoskeletal: Normal range of motion.  Neurological: She is alert.  Skin: Skin is warm and dry. She is not diaphoretic.  Nursing note and vitals reviewed.   ED Course  Procedures (including critical care time)  Labs Review Labs Reviewed  POCT URINALYSIS DIP (MANUAL ENTRY) - Abnormal; Notable for the following:    Color, UA light yellow (*)    Blood, UA moderate (*)    Protein Ur, POC trace (*)    Leukocytes, UA large (3+) (*)    All other components within normal limits  URINE CULTURE  WET PREP, GENITAL  HIV ANTIBODY (ROUTINE TESTING)  RPR  GC/CHLAMYDIA PROBE AMP, URINE  HSV(HERPES SMPLX)ABS-I+II(IGG+IGM)-BLD    Imaging Review No results found.    MDM   1. Vaginal itching   2. Vaginal discharge   3. Vaginal lesion   4. Dysuria    Pt c/o vaginal irritation with urinary symptoms for 3 days. Pt appears well, non-toxic, is afebrile. Vaginal exam: multiple hypopigmented lesions on external genitalia. Lesions are non-tender. More c/w genital warts than herpes.  UA: leukocytes present but negative nitrites. Will send culture Labs for GC/chlamydia, wet prep, HIV, syphilis, and herpes sent off to lab Encouraged pt to f/u with PCP for pap smear to test for HPV. Advised pt she will be notified of lab results and any medication indicated at that time could be called in.  Advised pt she could keep using Monostat if it helped with her symptoms, however, if symptoms are worsening she may discontinue until results come back. Patient verbalized understanding and agreement with treatment  plan.     Noland Fordyce, PA-C 07/17/15 1825

## 2015-07-17 NOTE — Discharge Instructions (Signed)
Today you have some tests sent off to the lab.  The results should come back in 2-3 days and you should be notified by the beginning of next week if results come back on a weekend.  It is recommended you follow up with your primary care provider for a pap smear to check for HPV as that test was unable to be performed in the urgent care setting.  See below for further information about possible causes of vaginal discomfort.

## 2015-07-18 LAB — URINE CULTURE
Colony Count: NO GROWTH
Organism ID, Bacteria: NO GROWTH

## 2015-07-18 LAB — HIV ANTIBODY (ROUTINE TESTING W REFLEX): HIV 1&2 Ab, 4th Generation: NONREACTIVE

## 2015-07-18 LAB — RPR

## 2015-07-18 LAB — GC/CHLAMYDIA PROBE AMP, URINE
Chlamydia, Swab/Urine, PCR: NEGATIVE
GC Probe Amp, Urine: NEGATIVE

## 2015-07-18 LAB — WET PREP BY MOLECULAR PROBE
Candida species: NEGATIVE
Gardnerella vaginalis: POSITIVE — AB
Trichomonas vaginosis: NEGATIVE

## 2015-07-19 ENCOUNTER — Telehealth: Payer: Self-pay | Admitting: *Deleted

## 2015-07-19 LAB — HSV(HERPES SMPLX)ABS-I+II(IGG+IGM)-BLD
HSV 1 Glycoprotein G Ab, IgG: 8.81 IV — ABNORMAL HIGH
HSV 2 Glycoprotein G Ab, IgG: 0.37 IV
Herpes Simplex Vrs I&II-IgM Ab (EIA): 1.71 INDEX — ABNORMAL HIGH

## 2015-07-19 MED ORDER — VALACYCLOVIR HCL 1 G PO TABS: 1000.0000 mg | ORAL_TABLET | Freq: Two times a day (BID) | ORAL | Status: DC

## 2015-07-29 ENCOUNTER — Ambulatory Visit (INDEPENDENT_AMBULATORY_CARE_PROVIDER_SITE_OTHER): Payer: Federal, State, Local not specified - PPO | Admitting: Osteopathic Medicine

## 2015-07-29 ENCOUNTER — Encounter: Payer: Self-pay | Admitting: Osteopathic Medicine

## 2015-07-29 VITALS — BP 142/89 | HR 98 | Wt 140.0 lb

## 2015-07-29 DIAGNOSIS — A6000 Herpesviral infection of urogenital system, unspecified: Secondary | ICD-10-CM

## 2015-07-29 DIAGNOSIS — E538 Deficiency of other specified B group vitamins: Secondary | ICD-10-CM

## 2015-07-29 DIAGNOSIS — F329 Major depressive disorder, single episode, unspecified: Secondary | ICD-10-CM

## 2015-07-29 DIAGNOSIS — F411 Generalized anxiety disorder: Secondary | ICD-10-CM

## 2015-07-29 DIAGNOSIS — E559 Vitamin D deficiency, unspecified: Secondary | ICD-10-CM | POA: Diagnosis not present

## 2015-07-29 DIAGNOSIS — F32A Depression, unspecified: Secondary | ICD-10-CM

## 2015-07-29 DIAGNOSIS — D649 Anemia, unspecified: Secondary | ICD-10-CM

## 2015-07-29 MED ORDER — VORTIOXETINE HBR 10 MG PO TABS: 10.0000 mg | ORAL_TABLET | Freq: Every day | ORAL | Status: DC

## 2015-07-29 NOTE — Progress Notes (Signed)
HPI: April Vega is a 44 y.o. female who presents to [facility]  today for chief complaint of:  Chief Complaint  Patient presents with  . Results    lab  . Medication Problem    wants switched to Brintellix per last discussion with [facility]   Patient states she was trying to schedule an appointment with [facility] today, was unable to be seen there is here today with questions about lab results and requested change medication.  PSYCH: Tried Vibryd for panic and anxiety/depression problems. Wants to try Brintellix. Also having touble focusing. Notes reviewed, this is what was discussed with Luvenia Starch last visit. Previously on Vyvanse, Lexapro, Wellbutrin and Vilazodone without significant help with her symptoms.   LABS: Has questions about recent blood work and Urgent Care results. Reviewed w/ patient... (+)HSV and (+)hx genital sores (+)Bacterial Vaginosis, pt states she took all Flagyl (+)low Vit D, wants this rechecked (+)elevated B12, wants this rechecked (-)anemia, (+)low iron, pt asks about IV iron infusions, wants this rechecked.  Labs reviewed with patient, all questions answered, patient requests repeat of vitamin D, blood count, vitamin B12 today.   Past medical, social and family history reviewed: Past Medical History  Diagnosis Date  . ADHD (attention deficit hyperactivity disorder)    Past Surgical History  Procedure Laterality Date  . Bone graph  01/1990   Social History  Substance Use Topics  . Smoking status: Never Smoker   . Smokeless tobacco: Not on file  . Alcohol Use: Not on file   Family History  Problem Relation Age of Onset  . Diabetes Mother   . Hypertension Mother     Current Outpatient Prescriptions  Medication Sig Dispense Refill  . Iron-FA-B Cmp-C-Biot-Probiotic (FUSION PLUS) CAPS 1 capsule a day. 30 capsule 11  . magnesium gluconate (MAGONATE) 500 MG tablet Take 500 mg by mouth 2 (two) times daily.    . montelukast  (SINGULAIR) 10 MG tablet Take 1 tablet (10 mg total) by mouth at bedtime. 30 tablet 5  . Vitamin D, Ergocalciferol, (DRISDOL) 50000 UNITS CAPS capsule TAKE 1 CAPSULE (50,000 UNITS TOTAL) BY MOUTH EVERY 7 (SEVEN) DAYS. 12 capsule 0  . buPROPion (WELLBUTRIN XL) 300 MG 24 hr tablet TAKE 1 TABLET (300 MG TOTAL) BY MOUTH DAILY. (Patient not taking: Reported on 07/29/2015) 30 tablet 1  . escitalopram (LEXAPRO) 20 MG tablet Take 1 tablet (20 mg total) by mouth daily. Patient needs to schedule a follow up appointment before more refills. (Patient not taking: Reported on 07/29/2015) 30 tablet 0  . valACYclovir (VALTREX) 1000 MG tablet Take 1 tablet (1,000 mg total) by mouth 2 (two) times daily. For 10 days (Patient not taking: Reported on 07/29/2015) 20 tablet 0  . Vilazodone HCl 20 MG TABS Take $RemoveB'20mg'zFTlLLir$  tablet daily. (Patient not taking: Reported on 07/29/2015) 30 tablet 1   No current facility-administered medications for this visit.   Allergies  Allergen Reactions  . Latex Rash      Review of Systems: CONSTITUTIONAL:  No  fever, no chills, No  unintentional weight changes, (+) fatigue HEAD/EYES/EARS/NOSE/THROAT: No headache, no vision change, no hearing change, No  sore throat CARDIAC: No chest pain, no pressure/palpitations, no orthopnea RESPIRATORY: No  cough, No  shortness of breath/wheeze SKIN: No rash/wounds/concerning lesions HEM/ONC: No easy bruising/bleeding, no abnormal lymph node, positive history of anemia as noted in history of present illness, patient has not seen hematology in some time ENDOCRINE: No polyuria/polydipsia/polyphagia, no heat/cold intolerance  NEUROLOGIC: No  weakness, no dizziness, no slurred speech PSYCHIATRIC: No concerns with depression, (+) concerns with anxiety, (+) sleep problems    Exam:  BP 143/92 mmHg  Pulse 105  Wt 140 lb (63.504 kg)  SpO2 97%  LMP 06/28/2015 (Exact Date) Constitutional: VSS, see above. General Appearance: alert, well-developed,  well-nourished, NAD Eyes: Normal lids and conjunctive, non-icteric sclera, Respiratory: Normal respiratory effort. no wheeze, no rhonchi, no rales Cardiovascular: S1/S2 normal, no murmur, no rub/gallop auscultated. RRR.  Psychiatric: Fair judgment/insight. Flat mood and affect. Oriented x3.    Results for orders placed or performed in visit on 07/29/15 (from the past 72 hour(s))  CBC with Differential/Platelet     Status: Abnormal   Collection Time: 07/29/15  4:40 PM  Result Value Ref Range   WBC 6.9 4.0 - 10.5 K/uL   RBC 4.40 3.87 - 5.11 MIL/uL   Hemoglobin 12.0 12.0 - 15.0 g/dL   HCT 36.3 36.0 - 46.0 %   MCV 82.5 78.0 - 100.0 fL   MCH 27.3 26.0 - 34.0 pg   MCHC 33.1 30.0 - 36.0 g/dL   RDW 14.4 11.5 - 15.5 %   Platelets 532 (H) 150 - 400 K/uL   MPV 9.3 8.6 - 12.4 fL   Neutrophils Relative % 60 43 - 77 %   Neutro Abs 4.1 1.7 - 7.7 K/uL   Lymphocytes Relative 27 12 - 46 %   Lymphs Abs 1.9 0.7 - 4.0 K/uL   Monocytes Relative 9 3 - 12 %   Monocytes Absolute 0.6 0.1 - 1.0 K/uL   Eosinophils Relative 2 0 - 5 %   Eosinophils Absolute 0.1 0.0 - 0.7 K/uL   Basophils Relative 2 (H) 0 - 1 %   Basophils Absolute 0.1 0.0 - 0.1 K/uL   Smear Review Criteria for review not met   Iron and TIBC     Status: Abnormal   Collection Time: 07/29/15  4:40 PM  Result Value Ref Range   Iron 20 (L) 40 - 190 ug/dL   UIBC 293 125 - 400 ug/dL   TIBC 313 250 - 450 ug/dL   %SAT 6 (L) 11 - 50 %  Ferritin     Status: None   Collection Time: 07/29/15  4:40 PM  Result Value Ref Range   Ferritin 13 10 - 291 ng/mL  Vitamin D (25 hydroxy)     Status: Abnormal   Collection Time: 07/29/15  4:40 PM  Result Value Ref Range   Vit D, 25-Hydroxy 26 (L) 30 - 100 ng/mL    Comment: Vitamin D Status           25-OH Vitamin D        Deficiency                <20 ng/mL        Insufficiency         20 - 29 ng/mL        Optimal             > or = 30 ng/mL   For 25-OH Vitamin D testing on patients on D2-supplementation  and patients for whom quantitation of D2 and D3 fractions is required, the QuestAssureD 25-OH VIT D, (D2,D3), LC/MS/MS is recommended: order code [phone] (patients > 2 yrs).   B12     Status: Abnormal   Collection Time: 07/29/15  4:42 PM  Result Value Ref Range   Vitamin B-12 1465 (H) 211 - 911 pg/mL  ASSESSMENT/PLAN:  GAD (generalized anxiety disorder) - Plan: Vortioxetine HBr (BRINTELLIX) 10 MG TABS per review of Jade last note, this was an option given to the patient, advised patient insurance would not cover this medication, we'll need to follow-up with Jade on this issue.  Depression - Plan: Vortioxetine HBr (BRINTELLIX) 10 MG TABS  B12 deficiency - Plan: B12, CANCELED: Vitamin B12 >400= normal, patient was c called with this result  Vitamin D deficiency - Plan: Vitamin D (25 hydroxy) - better but still low, continue supplementation, patient was called with this result and instruction  Anemia, unspecified anemia type - Plan: CBC with Differential/Platelet, Iron and TIBC, Ferritin. 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

## 2015-07-30 ENCOUNTER — Telehealth: Payer: Self-pay

## 2015-07-30 DIAGNOSIS — E538 Deficiency of other specified B group vitamins: Secondary | ICD-10-CM | POA: Insufficient documentation

## 2015-07-30 DIAGNOSIS — A6000 Herpesviral infection of urogenital system, unspecified: Secondary | ICD-10-CM | POA: Insufficient documentation

## 2015-07-30 LAB — CBC WITH DIFFERENTIAL/PLATELET
BASOS ABS: 0.1 10*3/uL (ref 0.0–0.1)
Basophils Relative: 2 % — ABNORMAL HIGH (ref 0–1)
EOS ABS: 0.1 10*3/uL (ref 0.0–0.7)
Eosinophils Relative: 2 % (ref 0–5)
HCT: 36.3 % (ref 36.0–46.0)
HEMOGLOBIN: 12 g/dL (ref 12.0–15.0)
LYMPHS ABS: 1.9 10*3/uL (ref 0.7–4.0)
Lymphocytes Relative: 27 % (ref 12–46)
MCH: 27.3 pg (ref 26.0–34.0)
MCHC: 33.1 g/dL (ref 30.0–36.0)
MCV: 82.5 fL (ref 78.0–100.0)
MONOS PCT: 9 % (ref 3–12)
MPV: 9.3 fL (ref 8.6–12.4)
Monocytes Absolute: 0.6 10*3/uL (ref 0.1–1.0)
NEUTROS ABS: 4.1 10*3/uL (ref 1.7–7.7)
NEUTROS PCT: 60 % (ref 43–77)
PLATELETS: 532 10*3/uL — AB (ref 150–400)
RBC: 4.4 MIL/uL (ref 3.87–5.11)
RDW: 14.4 % (ref 11.5–15.5)
WBC: 6.9 10*3/uL (ref 4.0–10.5)

## 2015-07-30 LAB — IRON AND TIBC
%SAT: 6 % — AB (ref 11–50)
IRON: 20 ug/dL — AB (ref 40–190)
TIBC: 313 ug/dL (ref 250–450)
UIBC: 293 ug/dL (ref 125–400)

## 2015-07-30 LAB — VITAMIN B12: Vitamin B-12: 1465 pg/mL — ABNORMAL HIGH (ref 211–911)

## 2015-07-30 LAB — VITAMIN D 25 HYDROXY (VIT D DEFICIENCY, FRACTURES): Vit D, 25-Hydroxy: 26 ng/mL — ABNORMAL LOW (ref 30–100)

## 2015-07-30 LAB — FERRITIN: FERRITIN: 13 ng/mL (ref 10–291)

## 2015-07-30 NOTE — Telephone Encounter (Signed)
The Brintellix cost $175 a month. Patient will not be able to afford this medication. Please advise.

## 2015-07-30 NOTE — Telephone Encounter (Signed)
We can work on prior [place] or she needs to follow up with Luvenia Starch

## 2015-08-01 NOTE — Telephone Encounter (Signed)
Patient needs a cheaper medication.

## 2015-08-02 NOTE — Telephone Encounter (Signed)
This is with the discount card.

## 2015-08-02 NOTE — Telephone Encounter (Signed)
Confirm she infact did get a coupon card for this med.

## 2015-08-05 ENCOUNTER — Other Ambulatory Visit: Payer: Self-pay | Admitting: Physician Assistant

## 2015-08-05 NOTE — Telephone Encounter (Signed)
I sent viibryd this am. I can't remember has pt tried this?

## 2015-08-06 NOTE — Telephone Encounter (Signed)
Left message advising of recommendations.  

## 2015-08-07 NOTE — Telephone Encounter (Signed)
Left message for a return call

## 2015-08-14 ENCOUNTER — Other Ambulatory Visit: Payer: Self-pay | Admitting: Physician Assistant

## 2015-09-05 ENCOUNTER — Other Ambulatory Visit: Payer: Self-pay | Admitting: Physician Assistant

## 2015-09-24 ENCOUNTER — Other Ambulatory Visit: Payer: Self-pay | Admitting: Physician Assistant

## 2015-10-07 ENCOUNTER — Ambulatory Visit: Payer: Federal, State, Local not specified - PPO | Admitting: Physician Assistant

## 2015-10-10 ENCOUNTER — Other Ambulatory Visit: Payer: Self-pay | Admitting: Physician Assistant

## 2015-10-11 ENCOUNTER — Ambulatory Visit (INDEPENDENT_AMBULATORY_CARE_PROVIDER_SITE_OTHER): Payer: Federal, State, Local not specified - PPO | Admitting: Physician Assistant

## 2015-10-11 ENCOUNTER — Encounter: Payer: Self-pay | Admitting: Physician Assistant

## 2015-10-11 VITALS — BP 124/66 | HR 92 | Ht 64.0 in | Wt 135.0 lb

## 2015-10-11 DIAGNOSIS — E559 Vitamin D deficiency, unspecified: Secondary | ICD-10-CM | POA: Diagnosis not present

## 2015-10-11 DIAGNOSIS — F329 Major depressive disorder, single episode, unspecified: Secondary | ICD-10-CM | POA: Diagnosis not present

## 2015-10-11 DIAGNOSIS — R748 Abnormal levels of other serum enzymes: Secondary | ICD-10-CM

## 2015-10-11 DIAGNOSIS — F32A Depression, unspecified: Secondary | ICD-10-CM

## 2015-10-11 DIAGNOSIS — D509 Iron deficiency anemia, unspecified: Secondary | ICD-10-CM | POA: Diagnosis not present

## 2015-10-11 LAB — CBC WITH DIFFERENTIAL/PLATELET
Basophils Absolute: 0.1 10*3/uL (ref 0.0–0.1)
Basophils Relative: 2 % — ABNORMAL HIGH (ref 0–1)
EOS ABS: 0.4 10*3/uL (ref 0.0–0.7)
EOS PCT: 6 % — AB (ref 0–5)
HEMATOCRIT: 36.4 % (ref 36.0–46.0)
Hemoglobin: 11.4 g/dL — ABNORMAL LOW (ref 12.0–15.0)
LYMPHS ABS: 2.4 10*3/uL (ref 0.7–4.0)
LYMPHS PCT: 37 % (ref 12–46)
MCH: 26.4 pg (ref 26.0–34.0)
MCHC: 31.3 g/dL (ref 30.0–36.0)
MCV: 84.3 fL (ref 78.0–100.0)
MONO ABS: 0.7 10*3/uL (ref 0.1–1.0)
MONOS PCT: 10 % (ref 3–12)
MPV: 9.7 fL (ref 8.6–12.4)
Neutro Abs: 2.9 10*3/uL (ref 1.7–7.7)
Neutrophils Relative %: 45 % (ref 43–77)
PLATELETS: 438 10*3/uL — AB (ref 150–400)
RBC: 4.32 MIL/uL (ref 3.87–5.11)
RDW: 15.2 % (ref 11.5–15.5)
WBC: 6.5 10*3/uL (ref 4.0–10.5)

## 2015-10-11 MED ORDER — DULOXETINE HCL 30 MG PO CPEP
30.0000 mg | ORAL_CAPSULE | Freq: Every day | ORAL | Status: DC
Start: 2015-10-11 — End: 2016-01-02

## 2015-10-11 NOTE — Progress Notes (Signed)
   Subjective:    Patient ID: April Vega, female    DOB: 22-Sep-1970, 45 y.o.   MRN: [id number]  HPI Patient is a 45 yo female who presents to the clinic to follow-up on depression. She has tried numerous medications in the past. Most recent was switched to brintellix. No improvement. She went back on viibryd. She still feels very down and unmotivated. She did have a new trigger of her friend who is 15 years old who was diagnosed with terminal breast cancer. She is really struggling to deal with this. She denies any suicidal or homicidal thoughts.  She has ongoing IDA and vitamin D deficency. She would like levels checked. She is taking fusion and 50,000 units of vitamin d.    Review of Systems  All other systems reviewed and are negative.      Objective:   Physical Exam  Constitutional: She is oriented to person, place, and time. She appears well-developed and well-nourished.  HENT:  Head: Normocephalic and atraumatic.  Cardiovascular: Normal rate, regular rhythm and normal heart sounds.   Pulmonary/Chest: Effort normal and breath sounds normal.  Neurological: She is alert and oriented to person, place, and time.  Psychiatric: She has a normal mood and affect. Her behavior is normal.          Assessment & Plan:  Depression- taper off viibryd. Start cymbalta 30mg  daily. Follow up in 2 months. Discussed counseling to get through this hard time. Pt declined referral today.   Vitamin D deficiency- will recheck levels today.   Elevated B12- last check was elevated. Will recheck today.   IDA- continue fusion. Will recheck labs today.

## 2015-10-12 LAB — VITAMIN D 25 HYDROXY (VIT D DEFICIENCY, FRACTURES): Vit D, 25-Hydroxy: 39 ng/mL (ref 30–100)

## 2015-10-12 LAB — VITAMIN B12: Vitamin B-12: 852 pg/mL (ref 211–911)

## 2015-10-12 LAB — FERRITIN: Ferritin: 15 ng/mL (ref 10–291)

## 2015-10-23 LAB — HM MAMMOGRAPHY: HM MAMMO: NEGATIVE

## 2015-10-28 ENCOUNTER — Encounter: Payer: Self-pay | Admitting: Physician Assistant

## 2015-11-28 ENCOUNTER — Other Ambulatory Visit: Payer: Self-pay | Admitting: Physician Assistant

## 2015-12-30 ENCOUNTER — Telehealth: Payer: Self-pay | Admitting: *Deleted

## 2015-12-30 DIAGNOSIS — D509 Iron deficiency anemia, unspecified: Secondary | ICD-10-CM

## 2015-12-30 NOTE — Telephone Encounter (Signed)
Referral placed.

## 2015-12-30 NOTE — Telephone Encounter (Signed)
Pt left vm this morning asking for a referral/order for IV iron transfusion.

## 2015-12-30 NOTE — Telephone Encounter (Signed)
Ok to make referral to hematology for IV iron infusion. Please include last notes in January with labs and let them know pt is intolerant to oral iron.

## 2016-01-02 ENCOUNTER — Encounter: Payer: Self-pay | Admitting: Family

## 2016-01-02 ENCOUNTER — Ambulatory Visit: Payer: Federal, State, Local not specified - PPO

## 2016-01-02 ENCOUNTER — Ambulatory Visit (HOSPITAL_BASED_OUTPATIENT_CLINIC_OR_DEPARTMENT_OTHER): Payer: Federal, State, Local not specified - PPO | Admitting: Family

## 2016-01-02 ENCOUNTER — Other Ambulatory Visit (HOSPITAL_BASED_OUTPATIENT_CLINIC_OR_DEPARTMENT_OTHER): Payer: Federal, State, Local not specified - PPO

## 2016-01-02 ENCOUNTER — Other Ambulatory Visit: Payer: Self-pay | Admitting: Family

## 2016-01-02 VITALS — BP 128/75 | HR 88 | Temp 98.3°F | Resp 16 | Ht 64.0 in | Wt 137.0 lb

## 2016-01-02 DIAGNOSIS — D509 Iron deficiency anemia, unspecified: Secondary | ICD-10-CM

## 2016-01-02 DIAGNOSIS — N921 Excessive and frequent menstruation with irregular cycle: Secondary | ICD-10-CM

## 2016-01-02 DIAGNOSIS — D508 Other iron deficiency anemias: Secondary | ICD-10-CM

## 2016-01-02 DIAGNOSIS — K909 Intestinal malabsorption, unspecified: Secondary | ICD-10-CM | POA: Insufficient documentation

## 2016-01-02 HISTORY — DX: Intestinal malabsorption, unspecified: K90.9

## 2016-01-02 HISTORY — DX: Excessive and frequent menstruation with irregular cycle: N92.1

## 2016-01-02 LAB — CBC WITH DIFFERENTIAL (CANCER CENTER ONLY)
BASO#: 0.1 10*3/uL (ref 0.0–0.2)
BASO%: 2 % (ref 0.0–2.0)
EOS ABS: 0.6 10*3/uL — AB (ref 0.0–0.5)
EOS%: 9 % — ABNORMAL HIGH (ref 0.0–7.0)
HCT: 37.1 % (ref 34.8–46.6)
HGB: 12.4 g/dL (ref 11.6–15.9)
LYMPH#: 1.4 10*3/uL (ref 0.9–3.3)
LYMPH%: 23.3 % (ref 14.0–48.0)
MCH: 27 pg (ref 26.0–34.0)
MCHC: 33.4 g/dL (ref 32.0–36.0)
MCV: 81 fL (ref 81–101)
MONO#: 0.9 10*3/uL (ref 0.1–0.9)
MONO%: 13.8 % — AB (ref 0.0–13.0)
NEUT#: 3.2 10*3/uL (ref 1.5–6.5)
NEUT%: 51.9 % (ref 39.6–80.0)
PLATELETS: 343 10*3/uL (ref 145–400)
RBC: 4.59 10*6/uL (ref 3.70–5.32)
RDW: 15 % (ref 11.1–15.7)
WBC: 6.1 10*3/uL (ref 3.9–10.0)

## 2016-01-02 LAB — COMPREHENSIVE METABOLIC PANEL
ALT: 10 U/L (ref 0–55)
ANION GAP: 8 meq/L (ref 3–11)
AST: 14 U/L (ref 5–34)
Albumin: 3.8 g/dL (ref 3.5–5.0)
Alkaline Phosphatase: 39 U/L — ABNORMAL LOW (ref 40–150)
BILIRUBIN TOTAL: 0.32 mg/dL (ref 0.20–1.20)
BUN: 6.8 mg/dL — ABNORMAL LOW (ref 7.0–26.0)
CHLORIDE: 109 meq/L (ref 98–109)
CO2: 23 meq/L (ref 22–29)
Calcium: 9.6 mg/dL (ref 8.4–10.4)
Creatinine: 0.7 mg/dL (ref 0.6–1.1)
Glucose: 67 mg/dl — ABNORMAL LOW (ref 70–140)
POTASSIUM: 3.8 meq/L (ref 3.5–5.1)
Sodium: 140 mEq/L (ref 136–145)
Total Protein: 7.5 g/dL (ref 6.4–8.3)

## 2016-01-02 LAB — IRON AND TIBC
%SAT: 14 % — AB (ref 21–57)
IRON: 49 ug/dL (ref 41–142)
TIBC: 339 ug/dL (ref 236–444)
UIBC: 290 ug/dL (ref 120–384)

## 2016-01-02 LAB — CHCC SATELLITE - SMEAR

## 2016-01-02 LAB — FERRITIN: Ferritin: 9 ng/ml (ref 9–269)

## 2016-01-02 NOTE — Progress Notes (Signed)
Hematology/Oncology Consultation   Name: April Vega      MRN: [id number]    Location: Room/bed info not found  Date: 01/02/2016 Time:9:53 AM   REFERRING PHYSICIAN: Iran Planas, PA-C  REASON FOR CONSULT: Iron deficiency anemia   DIAGNOSIS: Iron deficiency anemia   HISTORY OF PRESENT ILLNESS: [name] is a very pleasant 45 yo African American female with history of iron deficiency anemia. She has been taking fusion plus iron supplement daily but this has not seemed to help. She is symptomatic with fatigue at this time.  She has no family history of anemia or sickle cell disease/trait. She still has her monthly cycle lasting 5 days with the first 2 being heavy.  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 She does not know the cause of the multiple miscarriages but does remember going on aspirin for the duration of her first pregnancy that she carried to term.  No personal cancer history. Family history includes mother with breast cancer.  Her mammogram in February was negative.  No fever, chills, n/v, cough, rash, dizziness, SOB, chest pain, ice cravings, palpitations, abdominal pain or changes in bowel or bladder habits.  No lymphadenopathy found on exam. No episodes of bleeding, bruising or petechiae.  She has maintained a good appetite and is staying well hydrated. No significant weight loss or gain.  No swelling or tenderness in her extremities. She has numbness and tingling occasionally in her fingertips that comes and goes.  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  She is originally from [place], [place] but relocated here almost 10 years ago for her husbands job.   ROS: All other 10 point review of systems is negative.   PAST MEDICAL HISTORY:   Past Medical History  Diagnosis Date  . ADHD (attention deficit hyperactivity disorder)     ALLERGIES: Allergies  Allergen  Reactions  . Latex Rash      MEDICATIONS:  Current Outpatient Prescriptions on File Prior to Visit  Medication Sig Dispense Refill  . Iron-FA-B Cmp-C-Biot-Probiotic (FUSION PLUS) CAPS 1 CAPSULE A DAY. 30 capsule 3  . montelukast (SINGULAIR) 10 MG tablet TAKE 1 TABLET BY MOUTH AT BEDTIME 30 tablet 6   No current facility-administered medications on file prior to visit.     PAST SURGICAL HISTORY Past Surgical History  Procedure Laterality Date  . Bone graph  01/1990    FAMILY HISTORY: Family History  Problem Relation Age of Onset  . Diabetes Mother   . Hypertension Mother     SOCIAL HISTORY:  reports that she has never smoked. She does not have any smokeless tobacco history on file. Her alcohol and drug histories are not on file.  PERFORMANCE STATUS: The patient's performance status is 1 - Symptomatic but completely ambulatory  PHYSICAL EXAM: Most Recent Vital Signs: Blood pressure 128/75, pulse 88, temperature 98.3 F (36.8 C), temperature source Oral, resp. rate 16, height 5\' 4"  (1.626 m), weight 137 lb (62.143 kg). BP 128/75 mmHg  Pulse 88  Temp(Src) 98.3 F (36.8 C) (Oral)  Resp 16  Ht 5\' 4"  (1.626 m)  Wt 137 lb (62.143 kg)  BMI 23.50 kg/m2  General Appearance:    Alert, cooperative, no distress, appears stated age  Head:    Normocephalic, without obvious abnormality, atraumatic  Eyes:    PERRL, conjunctiva/corneas clear, EOM's intact, fundi    benign, both eyes  Throat:   Lips, mucosa, and tongue normal; teeth and gums normal  Neck:   Supple, symmetrical, trachea midline, no adenopathy;    thyroid:  no enlargement/tenderness/nodules; no carotid   bruit or JVD  Back:     Symmetric, no curvature, ROM normal, no CVA tenderness  Lungs:     Clear to auscultation bilaterally, respirations unlabored  Chest Wall:    No tenderness or deformity   Heart:    Regular rate and rhythm, S1 and S2 normal, no murmur, rub   or gallop     Abdomen:     Soft, non-tender,  bowel sounds active all four quadrants,    no masses, no organomegaly        Extremities:   Extremities normal, atraumatic, no cyanosis or edema  Pulses:   2+ and symmetric all extremities  Skin:   Skin color, texture, turgor normal, no rashes or lesions  Lymph nodes:   Cervical, supraclavicular, and axillary nodes normal  Neurologic:   CNII-XII intact, normal strength, sensation and reflexes    throughout    LABORATORY DATA:  Results for orders placed or performed in visit on 01/02/16 (from the past 48 hour(s))  CBC w/Diff     Status: Abnormal   Collection Time: 01/02/16  9:27 AM  Result Value Ref Range   WBC 6.1 3.9 - 10.0 10e3/uL   RBC 4.59 3.70 - 5.32 10e6/uL   HGB 12.4 11.6 - 15.9 g/dL   HCT 37.1 34.8 - 46.6 %   MCV 81 81 - 101 fL   MCH 27.0 26.0 - 34.0 pg   MCHC 33.4 32.0 - 36.0 g/dL   RDW 15.0 11.1 - 15.7 %   Platelets 343 145 - 400 10e3/uL   NEUT# 3.2 1.5 - 6.5 10e3/uL   LYMPH# 1.4 0.9 - 3.3 10e3/uL   MONO# 0.9 0.1 - 0.9 10e3/uL   Eosinophils Absolute 0.6 (H) 0.0 - 0.5 10e3/uL   BASO# 0.1 0.0 - 0.2 10e3/uL   NEUT% 51.9 39.6 - 80.0 %   LYMPH% 23.3 14.0 - 48.0 %   MONO% 13.8 (H) 0.0 - 13.0 %   EOS% 9.0 (H) 0.0 - 7.0 %   BASO% 2.0 0.0 - 2.0 %  Smear     Status: None   Collection Time: 01/02/16  9:27 AM  Result Value Ref Range   Smear Result Smear Available       RADIOGRAPHY: No results found.     PATHOLOGY: None  ASSESSMENT/PLAN: [name] is a very pleasant 45 yo [place] American female with history of iron deficiency anemia despite being on an oral iron supplement. She is symptomatic with fatigue at this time. Her Hgb is stable at 12.4 with an MCV of 81.  She still has her cycles which are heavy 2 out of 5 days.  We will see what her iron studies show and get her set up for an infusion next week once we finish her prior auth with blue cross blue shield.   We will plan to see her back in 6 weeks for repeat labs and follow-up. All questions were answered.  She will contact us with any questions or concerns. We can certainly see her much sooner if necessary.  She was discussed with and also seen by Dr. Marin Vega and he is in agreement with the aforementioned.   [facility]     Addendum:  I saw and examined patient with Markia Kyer. I did look at her blood smear. She did have  some hyperchromic and microcytic red blood cells. She had no nuclear red blood cells. I saw no target cells.  She really is not that anemic. However, she suddenly seems to have iron deficiency by her iron studies that were done previously.  We will go ahead and give her IV iron. I'll think this should be a problem.  I'm sure that she is losing iron. I suspect that she also probably is not absorbing iron all that well.  There is nothing on her physical exam that looks suspicious.  We will plan to give her iron this week and next week. We will then get her back in 6 weeks.  We spent about 30 minutes with her today. She is very nice. It was fun talking to her.  Ramond Dial 24:6

## 2016-01-03 LAB — ERYTHROPOIETIN: ERYTHROPOIETIN: 31.1 m[IU]/mL — AB (ref 2.6–18.5)

## 2016-01-03 LAB — RETICULOCYTES: RETICULOCYTE COUNT: 0.6 % (ref 0.6–2.6)

## 2016-01-06 LAB — HEMOGLOBINOPATHY EVALUATION
HEMOGLOBIN A2 QUANTITATION: 2.6 % (ref 0.7–3.1)
HEMOGLOBIN F QUANTITATION: 0 % (ref 0.0–2.0)
HGB C: 0 %
HGB S: 0 %
Hgb A: 97.4 % (ref 94.0–98.0)

## 2016-01-10 ENCOUNTER — Ambulatory Visit (HOSPITAL_BASED_OUTPATIENT_CLINIC_OR_DEPARTMENT_OTHER): Payer: Federal, State, Local not specified - PPO

## 2016-01-10 VITALS — BP 124/78 | HR 76 | Temp 98.8°F | Resp 20

## 2016-01-10 DIAGNOSIS — D509 Iron deficiency anemia, unspecified: Secondary | ICD-10-CM | POA: Diagnosis not present

## 2016-01-10 MED ORDER — SODIUM CHLORIDE 0.9 % IV SOLN
Freq: Once | INTRAVENOUS | Status: AC
  Administered 2016-01-10: 16:00:00 via INTRAVENOUS

## 2016-01-10 MED ORDER — SODIUM CHLORIDE 0.9 % IV SOLN
510.0000 mg | Freq: Once | INTRAVENOUS | Status: AC
  Administered 2016-01-10: 510 mg via INTRAVENOUS
  Filled 2016-01-10: qty 17

## 2016-01-10 NOTE — Patient Instructions (Signed)

## 2016-02-14 ENCOUNTER — Other Ambulatory Visit (HOSPITAL_BASED_OUTPATIENT_CLINIC_OR_DEPARTMENT_OTHER): Payer: Federal, State, Local not specified - PPO

## 2016-02-14 ENCOUNTER — Ambulatory Visit: Payer: Federal, State, Local not specified - PPO | Admitting: Family

## 2016-02-14 DIAGNOSIS — D509 Iron deficiency anemia, unspecified: Secondary | ICD-10-CM | POA: Diagnosis not present

## 2016-02-14 LAB — CBC WITH DIFFERENTIAL (CANCER CENTER ONLY)
BASO#: 0.1 10*3/uL (ref 0.0–0.2)
BASO%: 1.4 % (ref 0.0–2.0)
EOS ABS: 0.4 10*3/uL (ref 0.0–0.5)
EOS%: 6.5 % (ref 0.0–7.0)
HCT: 38.3 % (ref 34.8–46.6)
HGB: 12.7 g/dL (ref 11.6–15.9)
LYMPH#: 1.9 10*3/uL (ref 0.9–3.3)
LYMPH%: 33 % (ref 14.0–48.0)
MCH: 28.3 pg (ref 26.0–34.0)
MCHC: 33.2 g/dL (ref 32.0–36.0)
MCV: 86 fL (ref 81–101)
MONO#: 0.7 10*3/uL (ref 0.1–0.9)
MONO%: 11.9 % (ref 0.0–13.0)
NEUT#: 2.8 10*3/uL (ref 1.5–6.5)
NEUT%: 47.2 % (ref 39.6–80.0)
Platelets: 395 10*3/uL (ref 145–400)
RBC: 4.48 10*6/uL (ref 3.70–5.32)
RDW: 17 % — ABNORMAL HIGH (ref 11.1–15.7)
WBC: 5.8 10*3/uL (ref 3.9–10.0)

## 2016-02-18 ENCOUNTER — Telehealth: Payer: Self-pay | Admitting: *Deleted

## 2016-02-18 DIAGNOSIS — G4700 Insomnia, unspecified: Secondary | ICD-10-CM | POA: Diagnosis not present

## 2016-02-18 DIAGNOSIS — F419 Anxiety disorder, unspecified: Secondary | ICD-10-CM | POA: Diagnosis not present

## 2016-02-18 LAB — IRON AND TIBC
%SAT: 28 % (ref 21–57)
IRON: 64 ug/dL (ref 41–142)
TIBC: 228 ug/dL — AB (ref 236–444)
UIBC: 164 ug/dL (ref 120–384)

## 2016-02-18 LAB — FERRITIN: FERRITIN: 79 ng/mL (ref 9–269)

## 2016-02-18 NOTE — Telephone Encounter (Addendum)
Patient aware of results.[name]  ----- Message from Eliezer Bottom, NP sent at 02/18/2016 11:36 AM EDT ----- Regarding: Iron  Iron studies look good. No infusion needed at this time.   April Vega  ----- Message -----    From: [facility] Interface    Sent: 02/14/2016   3:31 PM      To: Eliezer Bottom, NP

## 2016-02-19 ENCOUNTER — Ambulatory Visit: Payer: Federal, State, Local not specified - PPO | Admitting: Family

## 2016-02-20 ENCOUNTER — Ambulatory Visit (HOSPITAL_BASED_OUTPATIENT_CLINIC_OR_DEPARTMENT_OTHER): Payer: Federal, State, Local not specified - PPO | Admitting: Family

## 2016-02-20 ENCOUNTER — Encounter: Payer: Self-pay | Admitting: Family

## 2016-02-20 VITALS — BP 112/75 | HR 88 | Temp 98.5°F | Resp 14 | Ht 64.0 in | Wt 134.0 lb

## 2016-02-20 DIAGNOSIS — N921 Excessive and frequent menstruation with irregular cycle: Secondary | ICD-10-CM

## 2016-02-20 DIAGNOSIS — D509 Iron deficiency anemia, unspecified: Secondary | ICD-10-CM

## 2016-02-20 DIAGNOSIS — F988 Other specified behavioral and emotional disorders with onset usually occurring in childhood and adolescence: Secondary | ICD-10-CM | POA: Insufficient documentation

## 2016-02-20 DIAGNOSIS — D649 Anemia, unspecified: Secondary | ICD-10-CM | POA: Insufficient documentation

## 2016-02-20 DIAGNOSIS — D500 Iron deficiency anemia secondary to blood loss (chronic): Secondary | ICD-10-CM | POA: Diagnosis not present

## 2016-02-20 DIAGNOSIS — N920 Excessive and frequent menstruation with regular cycle: Secondary | ICD-10-CM | POA: Diagnosis not present

## 2016-02-20 DIAGNOSIS — E559 Vitamin D deficiency, unspecified: Secondary | ICD-10-CM

## 2016-02-20 MED ORDER — VITAMIN D (ERGOCALCIFEROL) 1.25 MG (50000 UNIT) PO CAPS: ORAL_CAPSULE | ORAL | Status: DC

## 2016-02-20 NOTE — Progress Notes (Signed)
Hematology and Oncology Follow Up Visit  Asanti Coward [id number] Feb 08, 1971 45 y.o. 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 is is here today for a follow-up. She is still feeling fatigued despite receiving a dose of Feraheme in late April. Her iron studies at this time look good. Iron saturation is 28% with a ferritin of 79. Hgb is 12.7 with an MCV of 86.  Her cycles are heavy and now twice a month for the last 2 months. We discussed her following up with her gynecologist regarding birth control.  No fever, chills,n/v, cough, rash, dizziness, SOB, chest pain, palpitations, abdominal pain or changes in bowel or bladder habits.  No swelling, tenderness, numbness or tingling in her extremities. No new aches or pains.  She has maintained a good appetite and is staying well hydrated. Her weight is stable.   Medications:    Medication List       This list is accurate as of: 02/20/16  3:37 PM.  Always use your most recent med list.               FUSION PLUS Caps  1 CAPSULE A DAY.     montelukast 10 MG tablet  Commonly known as:  SINGULAIR  TAKE 1 TABLET BY MOUTH AT BEDTIME     Vitamin D (Ergocalciferol) 50000 units Caps capsule  Commonly known as:  DRISDOL  TAKE ONE CAPSULE BY MOUTH EVERY 7 DAYS        Allergies:  Allergies  Allergen Reactions  . Latex Rash    Past Medical History, Surgical history, Social history, and Family History were reviewed and updated.  Review of Systems: All other 10 point review of systems is negative.   Physical Exam:  vitals were not taken for this visit.  Wt Readings from Last 3 Encounters:  01/02/16 137 lb (62.143 kg)  10/11/15 135 lb (61.236 kg)  07/29/15 140 lb (63.504 kg)    Ocular: Sclerae unicteric, pupils equal, round and reactive to light Ear-nose-throat: Oropharynx  clear, dentition fair Lymphatic: No cervical supraclavicular or axillary adenopathy Lungs no rales or rhonchi, good excursion bilaterally Heart regular rate and rhythm, no murmur appreciated Abd soft, nontender, positive bowel sounds, no liver or spleen tip palpated on exam, no fluid wave MSK no focal spinal tenderness, no joint edema Neuro: non-focal, well-oriented, appropriate affect Breasts: Deferred  Lab Results  Component Value Date   WBC 5.8 02/14/2016   HGB 12.7 02/14/2016   HCT 38.3 02/14/2016   MCV 86 02/14/2016   PLT 395 02/14/2016   Lab Results  Component Value Date   FERRITIN 79 02/14/2016   IRON 64 02/14/2016   TIBC 228* 02/14/2016   UIBC 164 02/14/2016   IRONPCTSAT 28 02/14/2016   Lab Results  Component Value Date   RBC 4.48 02/14/2016   No results found for: KPAFRELGTCHN, LAMBDASER, KAPLAMBRATIO No results found for: IGGSERUM, IGA, IGMSERUM No results found for: Odetta Pink, SPEI   Chemistry      Component Value Date/Time   NA 140 01/02/2016 0928   NA 138 12/17/2014 0851   K 3.8 01/02/2016 0928   K 4.2 12/17/2014 0851   CL 108 12/17/2014 0851   CO2 23 01/02/2016 0928   CO2 25 12/17/2014 0851   BUN 6.8* 01/02/2016 [id number]  BUN 11 12/17/2014 0851   CREATININE 0.7 01/02/2016 0928   CREATININE 0.53 12/17/2014 0851      Component Value Date/Time   CALCIUM 9.6 01/02/2016 0928   CALCIUM 9.3 12/17/2014 0851   ALKPHOS 39* 01/02/2016 0928   ALKPHOS 45 12/17/2014 0851   AST 14 01/02/2016 0928   AST 14 12/17/2014 0851   ALT 10 01/02/2016 0928   ALT 11 12/17/2014 0851   BILITOT 0.32 01/02/2016 0928   BILITOT 0.4 12/17/2014 0851     Impression and Plan: 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 She is still symptomatic with fatigue. She received Feraheme in April and her iron studies and CBC are stable at this time.   She has had 2 heavy cycles a month for 2 months and is interested in trying a form of birth control to help regulate this. I have followed up with her PCP regarding this and am currently waiting to hear back.  We will plan to see her back in 3 months for labs and follow-up.  She will contact us with any questions or concerns. We can certainly see her sooner if need be.   Eliezer Bottom, NP 6/1/20173:37 PM

## 2016-02-24 ENCOUNTER — Telehealth: Payer: Self-pay | Admitting: Physician Assistant

## 2016-02-24 NOTE — Telephone Encounter (Signed)
Can we call patient and see if she would like to follow up with this issue? We can certainly discuss OCP for bleeding control and check U/S to make fibroids not the cause.

## 2016-02-24 NOTE — Telephone Encounter (Signed)
-----   Message from Eliezer Bottom, NP sent at 02/21/2016 11:00 AM EDT ----- Regarding: Birth control Hi Remi Lopata,   I was wondering if you happened to know who [name] sees for gynecology of if she follows up with you for that as well? She received an iron infusion in April and her CBC and iron studies are stable right now. However, she did states that her cycles have been twice a month and quite heavy the last 2 months. I think she needs an [place] to assess for fibroids and may also benefit from birth control. I wasn't sure if you would be the one to discuss this with her or if I needed to follow-up with someone else. Thanks for you help with this matter! Have a great weekend!  Judson Roch

## 2016-03-02 ENCOUNTER — Ambulatory Visit (INDEPENDENT_AMBULATORY_CARE_PROVIDER_SITE_OTHER): Payer: Federal, State, Local not specified - PPO | Admitting: Sports Medicine

## 2016-03-02 ENCOUNTER — Encounter: Payer: Self-pay | Admitting: Sports Medicine

## 2016-03-02 VITALS — BP 104/69 | HR 101 | Resp 18 | Wt 135.0 lb

## 2016-03-02 DIAGNOSIS — M2170 Unequal limb length (acquired), unspecified site: Secondary | ICD-10-CM | POA: Diagnosis not present

## 2016-03-02 NOTE — Assessment & Plan Note (Addendum)
Minimal leg length discrepancy, custom orthotics with a lift on the left side created. Previous lift was excessive. Any residual limp is likely due to focal muscle weakness which we will further delineate specific muscle group weakness at the follow-up visit once she has gotten used to the new orthotics. She continues with significant residual intoeing, I have asked her to focus on line drills.
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
## 2016-03-05 ENCOUNTER — Ambulatory Visit (INDEPENDENT_AMBULATORY_CARE_PROVIDER_SITE_OTHER): Payer: Federal, State, Local not specified - PPO | Admitting: Obstetrics & Gynecology

## 2016-03-05 ENCOUNTER — Encounter: Payer: Self-pay | Admitting: Obstetrics & Gynecology

## 2016-03-05 VITALS — BP 107/65 | HR 86 | Resp 16 | Ht 63.0 in | Wt 130.0 lb

## 2016-03-05 DIAGNOSIS — N938 Other specified abnormal uterine and vaginal bleeding: Secondary | ICD-10-CM

## 2016-03-05 DIAGNOSIS — N9489 Other specified conditions associated with female genital organs and menstrual cycle: Secondary | ICD-10-CM | POA: Diagnosis not present

## 2016-03-05 MED ORDER — NORGESTREL-ETHINYL ESTRADIOL 0.3-30 MG-MCG PO TABS: 1.0000 | ORAL_TABLET | Freq: Every day | ORAL | Status: DC

## 2016-03-05 NOTE — Progress Notes (Signed)
   Subjective:    Patient ID: April Vega, female    DOB: [birth year], 45 y.o.   MRN: [id number]  HPI 45 yo M AAP2 (74 and 44 yo sons) here for evaluation of DUB, 2 periods per month for the last 4 months. They are lasting 5-6 days. She recently got an iron infusion, has issues absorbing iron. Her hbg was 12.7 after iron infusion last month.   Review of Systems She uses condoms and withdrawal for contraception. She has never used birth control. Pap utd and normal with negative HPV. 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

## 2016-03-06 LAB — MAGNESIUM: MAGNESIUM: 2 mg/dL (ref 1.5–2.5)

## 2016-03-06 LAB — TSH: TSH: 1.03 m[IU]/L

## 2016-03-12 NOTE — Telephone Encounter (Signed)
Pt is seeing Dr. Hulan Fray for this.

## 2016-03-16 ENCOUNTER — Telehealth: Payer: Self-pay | Admitting: *Deleted

## 2016-03-16 ENCOUNTER — Ambulatory Visit: Payer: Federal, State, Local not specified - PPO | Admitting: Sports Medicine

## 2016-03-16 NOTE — Telephone Encounter (Signed)
Pt called stating that she started OCP's 6/15.  She does have fibroids and has an U/S scheduled for 03/20/16/  She states that she has been bleeding since 03/05/16 even on the OCP's.  Suggested she take 2 OCP's daily and f/u with U/S on Friday.  She will call office next week for F/U.

## 2016-03-19 ENCOUNTER — Other Ambulatory Visit: Payer: Federal, State, Local not specified - PPO

## 2016-03-20 ENCOUNTER — Other Ambulatory Visit (INDEPENDENT_AMBULATORY_CARE_PROVIDER_SITE_OTHER): Payer: Federal, State, Local not specified - PPO | Admitting: *Deleted

## 2016-03-20 ENCOUNTER — Ambulatory Visit (INDEPENDENT_AMBULATORY_CARE_PROVIDER_SITE_OTHER): Payer: Federal, State, Local not specified - PPO | Admitting: Sports Medicine

## 2016-03-20 ENCOUNTER — Ambulatory Visit (INDEPENDENT_AMBULATORY_CARE_PROVIDER_SITE_OTHER): Payer: Federal, State, Local not specified - PPO

## 2016-03-20 ENCOUNTER — Encounter: Payer: Self-pay | Admitting: Sports Medicine

## 2016-03-20 VITALS — BP 130/82 | HR 72 | Wt 134.0 lb

## 2016-03-20 DIAGNOSIS — D261 Other benign neoplasm of corpus uteri: Secondary | ICD-10-CM | POA: Diagnosis not present

## 2016-03-20 DIAGNOSIS — D649 Anemia, unspecified: Secondary | ICD-10-CM

## 2016-03-20 DIAGNOSIS — M2170 Unequal limb length (acquired), unspecified site: Secondary | ICD-10-CM

## 2016-03-20 DIAGNOSIS — N938 Other specified abnormal uterine and vaginal bleeding: Secondary | ICD-10-CM

## 2016-03-20 DIAGNOSIS — E559 Vitamin D deficiency, unspecified: Secondary | ICD-10-CM

## 2016-03-20 DIAGNOSIS — D259 Leiomyoma of uterus, unspecified: Secondary | ICD-10-CM | POA: Diagnosis not present

## 2016-03-20 LAB — CBC
HCT: 39.6 % (ref 35.0–45.0)
Hemoglobin: 12.7 g/dL (ref 11.7–15.5)
MCH: 27.9 pg (ref 27.0–33.0)
MCHC: 32.1 g/dL (ref 32.0–36.0)
MCV: 86.8 fL (ref 80.0–100.0)
MPV: 9.5 fL (ref 7.5–12.5)
PLATELETS: 478 10*3/uL — AB (ref 140–400)
RBC: 4.56 MIL/uL (ref 3.80–5.10)
RDW: 16.2 % — ABNORMAL HIGH (ref 11.0–15.0)
WBC: 6 10*3/uL (ref 3.8–10.8)

## 2016-03-20 NOTE — Patient Instructions (Signed)
Hip Rehabilitation Protocol:  1.  Side leg raises.  3x30 with no weight, then 3x15 with 2 lb ankle weight, then 3x15 with 5 lb ankle weight 2.  Standing hip rotation.  3x30 with no weight, then 3x15 with 2 lb ankle weight, then 3x15 with 5 lb ankle weight. 3.  Side step ups.  3x30 with no weight, then 3x15 with 5 lbs in backpack, then 3x15 with 10 lbs in backpack. 

## 2016-03-20 NOTE — Assessment & Plan Note (Signed)
Improved significantly with custom orthotics. Adding an additional lift to the left orthotic. Also working on her fairly markedly bilateral hip abductor weakness as well as quad and hamstring weakness on the left.

## 2016-03-20 NOTE — Progress Notes (Signed)
  Subjective:    CC: follow-up  HPI: Leg length discrepancy: Doing much better with custom orthotics, she was overcorrected before. She also had significant left-sided quadriceps and hamstring weakness as well as bilateral hip abductor weakness.  Past medical history, Surgical history, Family history not pertinant except as noted below, Social history, Allergies, and medications have been entered into the medical record, reviewed, and no changes needed.   Review of Systems: No fevers, chills, night sweats, weight loss, chest pain, or shortness of breath.   Objective:    General: Well Developed, well nourished, and in no acute distress.  Neuro: Alert and oriented x3, extra-ocular muscles intact, sensation grossly intact.  HEENT: Normocephalic, atraumatic, pupils equal round reactive to light, neck supple, no masses, no lymphadenopathy, thyroid nonpalpable.  Skin: Warm and dry, no rashes. Cardiac: Regular rate and rhythm, no murmurs rubs or gallops, no lower extremity edema.  Respiratory: Clear to auscultation bilaterally. Not using accessory muscles, speaking in full sentences. Hips: Abductor strength is weak bilaterally. Knees: Quad and hamstring strength is weak on the left.  Impression and Recommendations:    I spent 25 minutes with this patient, greater than 50% was face-to-face time counseling regarding the above diagnoses

## 2016-03-21 LAB — VITAMIN D 25 HYDROXY (VIT D DEFICIENCY, FRACTURES): VIT D 25 HYDROXY: 39 ng/mL (ref 30–100)

## 2016-03-25 ENCOUNTER — Telehealth: Payer: Self-pay | Admitting: *Deleted

## 2016-03-25 NOTE — Telephone Encounter (Signed)
LM on voicemail of normal hgb and Vitamin D level

## 2016-03-26 ENCOUNTER — Telehealth: Payer: Self-pay | Admitting: *Deleted

## 2016-03-26 DIAGNOSIS — N938 Other specified abnormal uterine and vaginal bleeding: Secondary | ICD-10-CM

## 2016-03-26 MED ORDER — NORGESTREL-ETHINYL ESTRADIOL 0.5-50 MG-MCG PO TABS: 1.0000 | ORAL_TABLET | Freq: Every day | ORAL | Status: DC

## 2016-03-26 NOTE — Telephone Encounter (Signed)
Pt called stating that she just bled last week and now bleeding again.  Spoke with Dr Hulan Fray who switched to Ovral once daily.  Pt to rtn to office to go over results of recent U/S

## 2016-04-02 ENCOUNTER — Encounter: Payer: Self-pay | Admitting: Obstetrics & Gynecology

## 2016-04-02 ENCOUNTER — Ambulatory Visit (INDEPENDENT_AMBULATORY_CARE_PROVIDER_SITE_OTHER): Payer: Federal, State, Local not specified - PPO | Admitting: Obstetrics & Gynecology

## 2016-04-02 VITALS — BP 119/70 | HR 74 | Resp 16 | Ht 63.0 in | Wt 135.0 lb

## 2016-04-02 DIAGNOSIS — N938 Other specified abnormal uterine and vaginal bleeding: Secondary | ICD-10-CM | POA: Insufficient documentation

## 2016-04-02 DIAGNOSIS — N939 Abnormal uterine and vaginal bleeding, unspecified: Secondary | ICD-10-CM | POA: Diagnosis not present

## 2016-04-02 NOTE — Progress Notes (Signed)
   Subjective:    Patient ID: April Vega, female    DOB: [birth year], 45 y.o.   MRN: [id number]  HPI  45 yo MAA P2 here for follow up of her DUB. She started OCPs about 3 1/2 weeks ago. She is now in her second pack as she had to double up on some pills to get her bleeding to stop.    Review of Systems     Objective:   Physical Exam  WNWHBFNAD Breathing, conversing, and ambulating normally  UPT negative, consent signed, time out done Cervix prepped with betadine and grasped with a single tooth tenaculum Uterus sounded to 9 cm Pipelle used for 2 passes with a moderate amount of tissue obtained. She tolerated the procedure well.         Assessment & Plan:  DUB- await pathology Continue OCPs RTC 2 months/prn sooner

## 2016-04-07 ENCOUNTER — Telehealth: Payer: Self-pay | Admitting: *Deleted

## 2016-04-07 NOTE — Telephone Encounter (Signed)
Pt notified of neg Endometrial biopsy.

## 2016-04-08 ENCOUNTER — Encounter: Payer: Self-pay | Admitting: Physician Assistant

## 2016-04-08 ENCOUNTER — Ambulatory Visit (INDEPENDENT_AMBULATORY_CARE_PROVIDER_SITE_OTHER): Payer: Federal, State, Local not specified - PPO | Admitting: Physician Assistant

## 2016-04-08 VITALS — BP 115/65 | HR 88 | Ht 63.0 in | Wt 134.0 lb

## 2016-04-08 DIAGNOSIS — F329 Major depressive disorder, single episode, unspecified: Secondary | ICD-10-CM | POA: Diagnosis not present

## 2016-04-08 DIAGNOSIS — G4700 Insomnia, unspecified: Secondary | ICD-10-CM | POA: Diagnosis not present

## 2016-04-08 DIAGNOSIS — D509 Iron deficiency anemia, unspecified: Secondary | ICD-10-CM

## 2016-04-08 DIAGNOSIS — F32A Depression, unspecified: Secondary | ICD-10-CM

## 2016-04-08 DIAGNOSIS — F411 Generalized anxiety disorder: Secondary | ICD-10-CM | POA: Diagnosis not present

## 2016-04-08 MED ORDER — BUPROPION HCL ER (XL) 150 MG PO TB24: ORAL_TABLET | ORAL | Status: DC

## 2016-04-08 MED ORDER — SUVOREXANT 15 MG PO TABS: 1.0000 | ORAL_TABLET | Freq: Every day | ORAL | Status: DC

## 2016-04-08 NOTE — Progress Notes (Signed)
   Subjective:    Patient ID: April Vega, female    DOB: [birth year], 45 y.o.   MRN: [id number]  HPI  Pt is a 45 yo female who presents to the clinic for follow up.   She continues to struggle with depression and anxiety. She has done her research and wants to start wellbutrin again but at higher dose. She feels like wellbutrin helped the most but she felt like she got used to it. She continues to struggle with sleep. She feels like she can't stop her mind from thinking of all the things she needs to do. She frequently wakes up at night as well. When she can't sleep it is also hard to focus.   She had DUB and see Dr. Hulan Fray, [place]. She put her on OCP and bleeding has stopped. Last hgb in June was normal. Last infusion of iron was march 2017.   Review of Systems  All other systems reviewed and are negative.      Objective:   Physical Exam  Constitutional: She is oriented to person, place, and time. She appears well-developed and well-nourished.  HENT:  Head: Normocephalic and atraumatic.  Cardiovascular: Normal rate, regular rhythm and normal heart sounds.   Pulmonary/Chest: Effort normal and breath sounds normal.  Neurological: She is alert and oriented to person, place, and time.  Psychiatric: She has a normal mood and affect. Her behavior is normal.          Assessment & Plan:  Depression/GAD- PHQ-9 was 10. GAD-7 was 9. Restarted back on wellbutrin increased to 3 tablets daily. Follow up in 2 months.   Insomnia- Belsomra trial given. Coupon card given. Discussed side effects. HO given.   IDA- fusion tablets refilled. Last iron infusion was march 2017. hgb normal in June 2017. No active bleeding.   Menometrorrhagia- managed by Dr. Hulan Fray. Currently no bleeding. On OCP.

## 2016-04-08 NOTE — Patient Instructions (Signed)
[facility].   Suvorexant oral tablets What is this medicine? SUVOREXANT (su-vor-EX-ant) is used to treat insomnia. This medicine helps you to fall asleep and sleep through the night. This medicine may be used for other purposes; ask your health care provider or pharmacist if you have questions. What should I tell my health care provider before I take this medicine? They need to know if you have any of these conditions: -depression -history of a drug or alcohol abuse problem -history of daytime sleepiness -history of sudden onset of muscle weakness (cataplexy) -liver disease -lung or breathing disease -narcolepsy -suicidal thoughts, plans, or attempt; a previous suicide attempt by you or a family member -an unusual or allergic reaction to suvorexant, other medicines, foods, dyes, or preservatives -pregnant or trying to get pregnant -breast-feeding How should I use this medicine? Take this medicine by mouth within 30 minutes of going to bed. Do not take it unless you are able to stay in bed a full night before you must be active again. Follow the directions on the prescription label. For best results, it is better to take this medicine on an empty stomach. Do not take your medicine more often than directed. Do not stop taking this medicine on your own. Always follow your doctor or health care professional's advice. A special MedGuide will be given to you by the pharmacist with each prescription and refill. Be sure to read this information carefully each time. Talk to your pediatrician regarding the use of this medicine in children. Special care may be needed. Overdosage: If you think you have taken too much of this medicine contact a poison control center or emergency room at once. NOTE: This medicine is only for you. Do not share this medicine with others. What if I miss a dose? This medicine should only be taken immediately before going to sleep. Do not take double or extra doses. What may  interact with this medicine? -alcohol -antiviral medicines for HIV or AIDS -aprepitant -carbamazepine -certain antibiotics like ciprofloxacin, clarithromycin, erythromycin, telithromycin -certain medicines for depression or psychotic disturbances -certain medicines for fungal infections like ketoconazole, posaconazole, fluconazole, or itraconazole -conivaptan -digoxin -diltiazem -grapefruit juice -imatinib -medicines for anxiety or sleep -phenytoin -rifampin -verapamil This list may not describe all possible interactions. Give your health care provider a list of all the medicines, herbs, non-prescription drugs, or dietary supplements you use. Also tell them if you smoke, drink alcohol, or use illegal drugs. Some items may interact with your medicine. What should I watch for while using this medicine? Visit your doctor or health care professional for regular checks on your progress. Keep a regular sleep schedule by going to bed at about the same time each night. Avoid caffeine-containing drinks in the evening hours. When sleep medicines are used every night for more than a few weeks, they may stop working. Do not increase the dose on your own. Talk to your doctor if your insomnia worsens or is not better within 7 to 10 days. After taking this medicine for sleep, you may get up out of bed while not being fully awake and do an activity that you do not know you are doing. The next morning, you may have no memory of the event. Activities such as driving a car ("sleep-driving"), making and eating food, talking on the phone, sexual activity, and sleep-walking have been reported. Call your doctor right away if you find out you have done any of these activities. Do not take this medicine if you have used  alcohol that evening or before bed or taken another medicine for sleep, since your risk of doing these sleep-related activities will be increased. Do not take this medicine unless you are able to stay in  bed for a full night (7 to 8 hours) and do not drive or perform other activities requiring full alertness within 8 hours of a dose. Do not drive, use machinery, or do anything that needs mental alertness the day after you take the 20 mg dose of this medicine. The use of lower doses (10 mg) also has the potential to cause driving impairment the next day. You may have a decrease in mental alertness the day after use, even if you feel that you are fully awake. Tell your doctor if you will need to perform activities requiring full alertness, such as driving, the next day. Do not stand or sit up quickly after taking this medicine, especially if you are an older patient. This reduces the risk of dizzy or fainting spells. If you or your family notice any changes in your behavior, such as new or worsening depression, thoughts of harming yourself, anxiety, other unusual or disturbing thoughts, or memory loss, call your doctor right away. What side effects may I notice from receiving this medicine? Side effects that you should report to your doctor or health care professional as soon as possible: -allergic reactions like skin rash, itching or hives, swelling of the face, lips, or tongue -confusion -depressed mood -feeling faint or lightheaded, falls -hallucinations -inability to move or speak for up to several minutes while you are going to sleep or waking up -memory loss -periods of leg weakness lasting from seconds to a few minutes -problems with balance, speaking, walking -restlessness, excitability, or feelings of agitation -unusual activities while asleep like driving, eating, making phone calls Side effects that usually do not require medical attention (Report these to your doctor or health care professional if they continue or are bothersome.): -abnormal dreams -daytime drowsiness -diarrhea -dizziness -headache This list may not describe all possible side effects. Call your doctor for medical  advice about side effects. You may report side effects to FDA at 1-800-FDA-1088. Where should I keep my medicine? Keep out of the reach of children. This medicine can be abused. Keep your medicine in a safe place to protect it from theft. Do not share this medicine with anyone. Selling or giving away this medicine is dangerous and against the law. Store at room temperature between 15 and 30 degrees C (59 and 86 degrees F). Throw away any unused medicine after the expiration date. NOTE: This sheet is a summary. It may not cover all possible information. If you have questions about this medicine, talk to your doctor, pharmacist, or health care provider.    2016, Elsevier/Gold Standard. (2015-02-25 13:22:51)

## 2016-04-10 DIAGNOSIS — F32A Depression, unspecified: Secondary | ICD-10-CM | POA: Insufficient documentation

## 2016-04-10 DIAGNOSIS — F329 Major depressive disorder, single episode, unspecified: Secondary | ICD-10-CM | POA: Insufficient documentation

## 2016-04-29 DIAGNOSIS — K080 Exfoliation of teeth due to systemic causes: Secondary | ICD-10-CM | POA: Diagnosis not present

## 2016-04-30 DIAGNOSIS — K080 Exfoliation of teeth due to systemic causes: Secondary | ICD-10-CM | POA: Diagnosis not present

## 2016-05-04 ENCOUNTER — Ambulatory Visit: Payer: Federal, State, Local not specified - PPO | Admitting: Sports Medicine

## 2016-05-05 ENCOUNTER — Ambulatory Visit (INDEPENDENT_AMBULATORY_CARE_PROVIDER_SITE_OTHER): Payer: Federal, State, Local not specified - PPO | Admitting: Sports Medicine

## 2016-05-05 ENCOUNTER — Encounter: Payer: Self-pay | Admitting: Sports Medicine

## 2016-05-05 DIAGNOSIS — R29898 Other symptoms and signs involving the musculoskeletal system: Secondary | ICD-10-CM
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
## 2016-05-07 ENCOUNTER — Other Ambulatory Visit: Payer: Self-pay | Admitting: *Deleted

## 2016-05-07 MED ORDER — ESOMEPRAZOLE MAGNESIUM 40 MG PO CPDR: 40.0000 mg | DELAYED_RELEASE_CAPSULE | Freq: Every day | ORAL | 6 refills | Status: DC

## 2016-05-18 DIAGNOSIS — G454 Transient global amnesia: Secondary | ICD-10-CM | POA: Diagnosis not present

## 2016-05-18 DIAGNOSIS — Z91040 Latex allergy status: Secondary | ICD-10-CM | POA: Diagnosis not present

## 2016-05-18 DIAGNOSIS — R410 Disorientation, unspecified: Secondary | ICD-10-CM | POA: Diagnosis not present

## 2016-05-18 DIAGNOSIS — R413 Other amnesia: Secondary | ICD-10-CM | POA: Diagnosis not present

## 2016-05-18 DIAGNOSIS — Z79899 Other long term (current) drug therapy: Secondary | ICD-10-CM | POA: Diagnosis not present

## 2016-05-18 DIAGNOSIS — R4182 Altered mental status, unspecified: Secondary | ICD-10-CM | POA: Diagnosis not present

## 2016-05-18 DIAGNOSIS — F29 Unspecified psychosis not due to a substance or known physiological condition: Secondary | ICD-10-CM | POA: Diagnosis not present

## 2016-05-18 DIAGNOSIS — R531 Weakness: Secondary | ICD-10-CM | POA: Diagnosis not present

## 2016-05-18 DIAGNOSIS — N3090 Cystitis, unspecified without hematuria: Secondary | ICD-10-CM | POA: Diagnosis not present

## 2016-05-18 DIAGNOSIS — F419 Anxiety disorder, unspecified: Secondary | ICD-10-CM | POA: Diagnosis not present

## 2016-05-18 DIAGNOSIS — E559 Vitamin D deficiency, unspecified: Secondary | ICD-10-CM | POA: Diagnosis not present

## 2016-05-19 DIAGNOSIS — R4182 Altered mental status, unspecified: Secondary | ICD-10-CM | POA: Diagnosis not present

## 2016-05-19 DIAGNOSIS — R413 Other amnesia: Secondary | ICD-10-CM | POA: Diagnosis not present

## 2016-05-20 ENCOUNTER — Other Ambulatory Visit: Payer: Self-pay | Admitting: *Deleted

## 2016-05-20 MED ORDER — MONTELUKAST SODIUM 10 MG PO TABS: 10.0000 mg | ORAL_TABLET | Freq: Every day | ORAL | 1 refills | Status: DC

## 2016-05-29 ENCOUNTER — Other Ambulatory Visit: Payer: Federal, State, Local not specified - PPO

## 2016-05-29 ENCOUNTER — Ambulatory Visit: Payer: Federal, State, Local not specified - PPO | Admitting: Family

## 2016-06-29 ENCOUNTER — Ambulatory Visit (HOSPITAL_BASED_OUTPATIENT_CLINIC_OR_DEPARTMENT_OTHER): Payer: Federal, State, Local not specified - PPO | Admitting: Family

## 2016-06-29 ENCOUNTER — Other Ambulatory Visit (HOSPITAL_BASED_OUTPATIENT_CLINIC_OR_DEPARTMENT_OTHER): Payer: Federal, State, Local not specified - PPO

## 2016-06-29 ENCOUNTER — Encounter: Payer: Self-pay | Admitting: Family

## 2016-06-29 VITALS — BP 119/67 | HR 84 | Temp 98.4°F | Resp 18 | Ht 63.0 in | Wt 132.0 lb

## 2016-06-29 DIAGNOSIS — D509 Iron deficiency anemia, unspecified: Secondary | ICD-10-CM | POA: Diagnosis not present

## 2016-06-29 DIAGNOSIS — D500 Iron deficiency anemia secondary to blood loss (chronic): Secondary | ICD-10-CM

## 2016-06-29 DIAGNOSIS — E559 Vitamin D deficiency, unspecified: Secondary | ICD-10-CM

## 2016-06-29 DIAGNOSIS — D508 Other iron deficiency anemias: Secondary | ICD-10-CM

## 2016-06-29 DIAGNOSIS — R5382 Chronic fatigue, unspecified: Secondary | ICD-10-CM | POA: Diagnosis not present

## 2016-06-29 DIAGNOSIS — G4700 Insomnia, unspecified: Secondary | ICD-10-CM | POA: Diagnosis not present

## 2016-06-29 DIAGNOSIS — N921 Excessive and frequent menstruation with irregular cycle: Secondary | ICD-10-CM | POA: Diagnosis not present

## 2016-06-29 LAB — CBC WITH DIFFERENTIAL (CANCER CENTER ONLY)
BASO#: 0.1 10*3/uL (ref 0.0–0.2)
BASO%: 2 % (ref 0.0–2.0)
EOS ABS: 0.3 10*3/uL (ref 0.0–0.5)
EOS%: 3.8 % (ref 0.0–7.0)
HEMATOCRIT: 39.9 % (ref 34.8–46.6)
HEMOGLOBIN: 13.2 g/dL (ref 11.6–15.9)
LYMPH#: 2 10*3/uL (ref 0.9–3.3)
LYMPH%: 29.4 % (ref 14.0–48.0)
MCH: 29.2 pg (ref 26.0–34.0)
MCHC: 33.1 g/dL (ref 32.0–36.0)
MCV: 88 fL (ref 81–101)
MONO#: 0.7 10*3/uL (ref 0.1–0.9)
MONO%: 9.9 % (ref 0.0–13.0)
NEUT%: 54.9 % (ref 39.6–80.0)
NEUTROS ABS: 3.7 10*3/uL (ref 1.5–6.5)
Platelets: 447 10*3/uL — ABNORMAL HIGH (ref 145–400)
RBC: 4.52 10*6/uL (ref 3.70–5.32)
RDW: 14.9 % (ref 11.1–15.7)
WBC: 6.7 10*3/uL (ref 3.9–10.0)

## 2016-06-29 LAB — IRON AND TIBC
%SAT: 19 % — AB (ref 21–57)
IRON: 65 ug/dL (ref 41–142)
TIBC: 336 ug/dL (ref 236–444)
UIBC: 271 ug/dL (ref 120–384)

## 2016-06-29 LAB — FERRITIN: FERRITIN: 12 ng/mL (ref 9–269)

## 2016-06-29 NOTE — Progress Notes (Signed)
Hematology and Oncology Follow Up Visit  April Vega [id number] Feb 23, 1971 45 y.o. 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 is is here today for a follow-up. She is feeling fatigued and continues to have problems sleeping. She states that she only sleeps for 2 hours at a time.  She continues to have heavy cycles and has recently changed birth control to Deercroft. Hopefully this will help regulate/lessen her cycle.  She last received Feraheme in April and had a nice response. Hgb and MCV continue to improve.  She has had a few episodes of dizziness that come and go with taking a moment to rest. No falls or syncope. No fever, chills,n/v, cough, rash, SOB, chest pain, palpitations, abdominal pain or changes in bowel or bladder habits.  No swelling, tenderness, numbness or tingling in her extremities. No joint aches or c/o pain.  She has maintained a good appetite and has been craving salty snacks and M&Ms. She is staying well hydrated. Her weight is up 6 lbs since her last visit.   Medications:    Medication List       Accurate as of 06/29/16  1:43 PM. Always use your most recent med list.          buPROPion 150 MG 24 hr tablet Commonly known as:  WELLBUTRIN XL 3 tablets once a day.   esomeprazole 40 MG capsule Commonly known as:  NEXIUM Take 1 capsule (40 mg total) by mouth daily.   FUSION PLUS Caps 1 CAPSULE A DAY.   montelukast 10 MG tablet Commonly known as:  SINGULAIR Take 1 tablet (10 mg total) by mouth at bedtime.   norgestrel-ethinyl estradiol 0.5-50 MG-MCG tablet Commonly known as:  OGESTROL Take 1 tablet by mouth daily.   Suvorexant 15 MG Tabs Commonly known as:  BELSOMRA Take 1 tablet by mouth at bedtime.   Vitamin D (Ergocalciferol) 50000 units Caps capsule Commonly known as:  DRISDOL TAKE ONE  CAPSULE BY MOUTH EVERY 7 DAYS       Allergies:  Allergies  Allergen Reactions  . Latex Rash    Past Medical History, Surgical history, Social history, and Family History were reviewed and updated.  Review of Systems: All other 10 point review of systems is negative.   Physical Exam:  vitals were not taken for this visit.  Wt Readings from Last 3 Encounters:  05/05/16 126 lb 4.8 oz (57.3 kg)  04/08/16 134 lb (60.8 kg)  04/02/16 135 lb (61.2 kg)    Ocular: Sclerae unicteric, pupils equal, round and reactive to light Ear-nose-throat: Oropharynx clear, dentition fair Lymphatic: No cervical supraclavicular or axillary adenopathy Lungs no rales or rhonchi, good excursion bilaterally Heart regular rate and rhythm, no murmur appreciated Abd soft, nontender, positive bowel sounds, no liver or spleen tip palpated on exam, no fluid wave MSK no focal spinal tenderness, no joint edema Neuro: non-focal, well-oriented, appropriate affect Breasts: Deferred  Lab Results  Component Value Date   WBC 6.0 03/20/2016   HGB 12.7 03/20/2016   HCT 39.6 03/20/2016   MCV 86.8 03/20/2016   PLT 478 (H) 03/20/2016   Lab Results  Component Value Date   FERRITIN 79 02/14/2016   IRON 64 02/14/2016   TIBC 228 (L) 02/14/2016   UIBC 164 02/14/2016   IRONPCTSAT 28 02/14/2016   Lab Results  Component  Value Date   RBC 4.56 03/20/2016   No results found for: KPAFRELGTCHN, LAMBDASER, KAPLAMBRATIO No results found for: IGGSERUM, IGA, IGMSERUM No results found for: Odetta Pink, SPEI   Chemistry      Component Value Date/Time   NA 140 01/02/2016 0928   K 3.8 01/02/2016 0928   CL 108 12/17/2014 0851   CO2 23 01/02/2016 0928   BUN 6.8 (L) 01/02/2016 0928   CREATININE 0.7 01/02/2016 0928      Component Value Date/Time   CALCIUM 9.6 01/02/2016 0928   ALKPHOS 39 (L) 01/02/2016 0928   AST 14 01/02/2016 0928   ALT 10 01/02/2016 0928    BILITOT 0.32 01/02/2016 0928     Impression and Plan: [name] is a very pleasant 45 yo African American female with history of iron deficiency anemia secondary to metromenorrhagia. She continues to have heavy cycles and was seen by gynecology and changed to a new oral contraceptive. We will see if her cycles improve on this. She is symptomatic with fatigue and occasional dizziness.  We will see what her iron studies show and bring her back in later this week for an infusion if needed.  We will plan to see her back in 4 months for labs and follow-up.  She will contact us with any questions or concerns. We can certainly see her sooner if need be.   Eliezer Bottom, NP 10/9/20171:43 PM

## 2016-07-03 ENCOUNTER — Other Ambulatory Visit: Payer: Self-pay | Admitting: Family

## 2016-07-20 ENCOUNTER — Ambulatory Visit (HOSPITAL_BASED_OUTPATIENT_CLINIC_OR_DEPARTMENT_OTHER): Payer: Federal, State, Local not specified - PPO

## 2016-07-20 VITALS — BP 129/67 | HR 76 | Temp 98.3°F | Resp 18

## 2016-07-20 DIAGNOSIS — K080 Exfoliation of teeth due to systemic causes: Secondary | ICD-10-CM | POA: Diagnosis not present

## 2016-07-20 DIAGNOSIS — D500 Iron deficiency anemia secondary to blood loss (chronic): Secondary | ICD-10-CM | POA: Diagnosis not present

## 2016-07-20 MED ORDER — FERUMOXYTOL INJECTION 510 MG/17 ML
510.0000 mg | Freq: Once | INTRAVENOUS | Status: AC
  Administered 2016-07-20: 510 mg via INTRAVENOUS
  Filled 2016-07-20: qty 17

## 2016-07-20 MED ORDER — SODIUM CHLORIDE 0.9 % IV SOLN
Freq: Once | INTRAVENOUS | Status: AC
  Administered 2016-07-20: 16:00:00 via INTRAVENOUS

## 2016-07-20 NOTE — Patient Instructions (Signed)

## 2016-08-09 ENCOUNTER — Other Ambulatory Visit: Payer: Self-pay | Admitting: Physician Assistant

## 2016-09-13 ENCOUNTER — Other Ambulatory Visit: Payer: Self-pay | Admitting: Physician Assistant

## 2016-09-17 DIAGNOSIS — K080 Exfoliation of teeth due to systemic causes: Secondary | ICD-10-CM | POA: Diagnosis not present

## 2016-10-04 ENCOUNTER — Other Ambulatory Visit: Payer: Self-pay | Admitting: Family

## 2016-10-04 DIAGNOSIS — D509 Iron deficiency anemia, unspecified: Secondary | ICD-10-CM

## 2016-10-04 DIAGNOSIS — E559 Vitamin D deficiency, unspecified: Secondary | ICD-10-CM

## 2016-10-04 DIAGNOSIS — N921 Excessive and frequent menstruation with irregular cycle: Secondary | ICD-10-CM

## 2016-10-05 ENCOUNTER — Other Ambulatory Visit: Payer: Self-pay | Admitting: Physician Assistant

## 2016-10-05 ENCOUNTER — Ambulatory Visit (INDEPENDENT_AMBULATORY_CARE_PROVIDER_SITE_OTHER): Payer: Federal, State, Local not specified - PPO | Admitting: Physician Assistant

## 2016-10-05 ENCOUNTER — Encounter: Payer: Self-pay | Admitting: Physician Assistant

## 2016-10-05 VITALS — BP 136/86 | HR 85 | Ht 63.0 in | Wt 130.0 lb

## 2016-10-05 DIAGNOSIS — K909 Intestinal malabsorption, unspecified: Secondary | ICD-10-CM | POA: Diagnosis not present

## 2016-10-05 DIAGNOSIS — R5383 Other fatigue: Secondary | ICD-10-CM | POA: Diagnosis not present

## 2016-10-05 DIAGNOSIS — E559 Vitamin D deficiency, unspecified: Secondary | ICD-10-CM

## 2016-10-05 LAB — CBC WITH DIFFERENTIAL/PLATELET
BASOS ABS: 66 {cells}/uL (ref 0–200)
Basophils Relative: 1 %
EOS ABS: 330 {cells}/uL (ref 15–500)
Eosinophils Relative: 5 %
HEMATOCRIT: 41.7 % (ref 35.0–45.0)
HEMOGLOBIN: 13.5 g/dL (ref 11.7–15.5)
LYMPHS PCT: 21 %
Lymphs Abs: 1386 cells/uL (ref 850–3900)
MCH: 28.8 pg (ref 27.0–33.0)
MCHC: 32.4 g/dL (ref 32.0–36.0)
MCV: 89.1 fL (ref 80.0–100.0)
MONO ABS: 594 {cells}/uL (ref 200–950)
MPV: 9.7 fL (ref 7.5–12.5)
Monocytes Relative: 9 %
NEUTROS PCT: 64 %
Neutro Abs: 4224 cells/uL (ref 1500–7800)
Platelets: 480 10*3/uL — ABNORMAL HIGH (ref 140–400)
RBC: 4.68 MIL/uL (ref 3.80–5.10)
RDW: 14 % (ref 11.0–15.0)
WBC: 6.6 10*3/uL (ref 3.8–10.8)

## 2016-10-05 LAB — FERRITIN: FERRITIN: 90 ng/mL (ref 10–232)

## 2016-10-05 LAB — TSH: TSH: 0.91 mIU/L

## 2016-10-05 MED ORDER — FUSION PLUS PO CAPS: 1.0000 | ORAL_CAPSULE | Freq: Every day | ORAL | 11 refills | Status: DC
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
## 2016-10-06 LAB — VITAMIN B12: Vitamin B-12: 348 pg/mL (ref 200–1100)

## 2016-10-06 LAB — MAGNESIUM: Magnesium: 2.1 mg/dL (ref 1.5–2.5)

## 2016-10-06 LAB — COMPLETE METABOLIC PANEL WITH GFR
ALBUMIN: 3.7 g/dL (ref 3.6–5.1)
ALK PHOS: 26 U/L — AB (ref 33–115)
ALT: 9 U/L (ref 6–29)
AST: 11 U/L (ref 10–35)
BILIRUBIN TOTAL: 0.4 mg/dL (ref 0.2–1.2)
BUN: 8 mg/dL (ref 7–25)
CO2: 23 mmol/L (ref 20–31)
CREATININE: 0.73 mg/dL (ref 0.50–1.10)
Calcium: 9 mg/dL (ref 8.6–10.2)
Chloride: 110 mmol/L (ref 98–110)
GFR, Est African American: >89 mL/min (ref >=60)
GFR, Est Non African American: >89 mL/min (ref >=60)
GLUCOSE: 81 mg/dL (ref 65–99)
Potassium: 4.3 mmol/L (ref 3.5–5.3)
SODIUM: 141 mmol/L (ref 135–146)
TOTAL PROTEIN: 6.5 g/dL (ref 6.1–8.1)

## 2016-10-06 LAB — IRON AND TIBC
%SAT: 35 % (ref 11–50)
Iron: 97 ug/dL (ref 40–190)
TIBC: 279 ug/dL (ref 250–450)
UIBC: 182 ug/dL (ref 125–400)

## 2016-10-06 LAB — VITAMIN D 25 HYDROXY (VIT D DEFICIENCY, FRACTURES): Vit D, 25-Hydroxy: 60 ng/mL (ref 30–100)

## 2016-10-08 LAB — VITAMIN B6: Vitamin B6: 29.1 ng/mL — ABNORMAL HIGH (ref 2.1–21.7)

## 2016-10-09 NOTE — Progress Notes (Signed)
B6 is elevated. Watch any supplements that might have b vitamins in it. You don't want to over supplement.

## 2016-10-12 ENCOUNTER — Ambulatory Visit (INDEPENDENT_AMBULATORY_CARE_PROVIDER_SITE_OTHER): Payer: Federal, State, Local not specified - PPO | Admitting: Physician Assistant

## 2016-10-12 ENCOUNTER — Ambulatory Visit (INDEPENDENT_AMBULATORY_CARE_PROVIDER_SITE_OTHER): Payer: Federal, State, Local not specified - PPO | Admitting: Sports Medicine

## 2016-10-12 ENCOUNTER — Encounter: Payer: Self-pay | Admitting: Sports Medicine

## 2016-10-12 VITALS — BP 126/75 | HR 74

## 2016-10-12 DIAGNOSIS — R29898 Other symptoms and signs involving the musculoskeletal system: Secondary | ICD-10-CM

## 2016-10-12 DIAGNOSIS — E559 Vitamin D deficiency, unspecified: Secondary | ICD-10-CM

## 2016-10-12 DIAGNOSIS — E538 Deficiency of other specified B group vitamins: Secondary | ICD-10-CM

## 2016-10-12 MED ORDER — CYANOCOBALAMIN 1000 MCG/ML IJ SOLN
1000.0000 ug | Freq: Once | INTRAMUSCULAR | Status: AC
  Administered 2016-10-12: 1000 ug via INTRAMUSCULAR

## 2016-10-12 NOTE — Assessment & Plan Note (Signed)
Improving limp with correction of the leg length discrepancy, I did make her a new set of custom orthotics today with limp correction. Still has significant weakness to eversion and dorsiflexion and plantar flexion on the left side, pain in the groin all the way down to the medial ankle. Concern is for peroneal nerve palsy, still awaiting nerve conduction study and EMG.

## 2016-10-12 NOTE — Progress Notes (Signed)
Patient came into clinic today to begin monthly B12 injections. Patient reports she has gotten these injections in the past when she had a Primary Care Physician in [place], no negative side effects at that time. Patient tolerated B12 injection in right deltoid well, no immediate complications. Advised to schedule her next visit in 4 weeks. Verbalized understanding. No further questions/concerns at this time.

## 2016-10-12 NOTE — Progress Notes (Signed)
    Patient was fitted for a : standard, cushioned, semi-rigid orthotic. The orthotic was heated and afterward the patient stood on the orthotic blank positioned on the orthotic stand. The patient was positioned in subtalar neutral position and 10 degrees of ankle dorsiflexion in a weight bearing stance. After completion of molding, a stable base was applied to the orthotic blank. The blank was ground to a stable position for weight bearing. Size: 8 Base: [employer] safety [employer] and [place]: Additional EVA on left orthotic AND felt lift. The patient ambulated these, and they were very comfortable.  I spent 40 minutes with this patient, greater than 50% was face-to-face time counseling regarding the below diagnosis.

## 2016-11-04 DIAGNOSIS — Z1231 Encounter for screening mammogram for malignant neoplasm of breast: Secondary | ICD-10-CM | POA: Diagnosis not present

## 2016-11-04 LAB — HM MAMMOGRAPHY

## 2016-11-13 ENCOUNTER — Ambulatory Visit (INDEPENDENT_AMBULATORY_CARE_PROVIDER_SITE_OTHER): Payer: Federal, State, Local not specified - PPO | Admitting: Family Medicine

## 2016-11-13 VITALS — BP 129/79 | HR 79

## 2016-11-13 DIAGNOSIS — D508 Other iron deficiency anemias: Secondary | ICD-10-CM | POA: Diagnosis not present

## 2016-11-13 DIAGNOSIS — E538 Deficiency of other specified B group vitamins: Secondary | ICD-10-CM | POA: Diagnosis not present

## 2016-11-13 MED ORDER — CYANOCOBALAMIN 1000 MCG/ML IJ SOLN
1000.0000 ug | Freq: Once | INTRAMUSCULAR | Status: AC
Start: 2016-11-13 — End: 2016-11-13
  Administered 2016-11-13: 1000 ug via INTRAMUSCULAR

## 2016-11-13 NOTE — Progress Notes (Signed)
Patient came into clinic today for her monthly B12 injections. Patient reports no negative side effects at that time. Patient tolerated B12 injection in right deltoid well (per Pt request), no immediate complications. Advised to schedule her next visit in 4 weeks. Verbalized understanding. No further questions/concerns at this time.

## 2016-11-13 NOTE — Progress Notes (Signed)
Agree with below. \Catherine Metheney, MD  

## 2016-11-15 ENCOUNTER — Encounter: Payer: Self-pay | Admitting: Physician Assistant

## 2016-11-26 DIAGNOSIS — D485 Neoplasm of uncertain behavior of skin: Secondary | ICD-10-CM | POA: Diagnosis not present

## 2016-11-26 DIAGNOSIS — D225 Melanocytic nevi of trunk: Secondary | ICD-10-CM | POA: Diagnosis not present

## 2016-11-26 DIAGNOSIS — D224 Melanocytic nevi of scalp and neck: Secondary | ICD-10-CM | POA: Diagnosis not present

## 2016-11-26 DIAGNOSIS — D2239 Melanocytic nevi of other parts of face: Secondary | ICD-10-CM | POA: Diagnosis not present

## 2016-11-26 DIAGNOSIS — L814 Other melanin hyperpigmentation: Secondary | ICD-10-CM | POA: Diagnosis not present

## 2016-11-26 DIAGNOSIS — D3610 Benign neoplasm of peripheral nerves and autonomic nervous system, unspecified: Secondary | ICD-10-CM | POA: Diagnosis not present

## 2016-11-27 ENCOUNTER — Other Ambulatory Visit: Payer: Federal, State, Local not specified - PPO

## 2016-11-27 ENCOUNTER — Ambulatory Visit: Payer: Federal, State, Local not specified - PPO | Admitting: Family

## 2016-12-10 ENCOUNTER — Ambulatory Visit: Payer: Federal, State, Local not specified - PPO

## 2016-12-17 ENCOUNTER — Ambulatory Visit (INDEPENDENT_AMBULATORY_CARE_PROVIDER_SITE_OTHER): Payer: Federal, State, Local not specified - PPO | Admitting: Physician Assistant

## 2016-12-17 VITALS — BP 131/76 | HR 60 | Resp 16 | Wt 130.0 lb

## 2016-12-17 DIAGNOSIS — D508 Other iron deficiency anemias: Secondary | ICD-10-CM | POA: Diagnosis not present

## 2016-12-17 MED ORDER — CYANOCOBALAMIN 1000 MCG/ML IJ SOLN
1000.0000 ug | Freq: Once | INTRAMUSCULAR | Status: AC
  Administered 2016-12-17: 1000 ug via INTRAMUSCULAR

## 2016-12-17 NOTE — Progress Notes (Signed)
   Subjective:    Patient ID: April Vega, female    DOB: 1970/11/28, 46 y.o.   MRN: [id number]  HPI Patient is here for a vitamin B 12 injection. She denies any GI problems or dizziness. She is not noticing any changes in energy level; although when nearing next injection she feels more fatigued.    Review of Systems     Objective:   Physical Exam        Assessment & Plan:  Patient tolerated injection without complications. Advised her to schedule next injection for 4 weeks from today.

## 2016-12-21 ENCOUNTER — Ambulatory Visit (HOSPITAL_BASED_OUTPATIENT_CLINIC_OR_DEPARTMENT_OTHER): Payer: Federal, State, Local not specified - PPO | Admitting: Family

## 2016-12-21 ENCOUNTER — Other Ambulatory Visit (HOSPITAL_BASED_OUTPATIENT_CLINIC_OR_DEPARTMENT_OTHER): Payer: Federal, State, Local not specified - PPO

## 2016-12-21 VITALS — BP 123/78 | HR 71 | Temp 98.2°F | Resp 17 | Wt 128.8 lb

## 2016-12-21 DIAGNOSIS — D508 Other iron deficiency anemias: Secondary | ICD-10-CM

## 2016-12-21 DIAGNOSIS — D510 Vitamin B12 deficiency anemia due to intrinsic factor deficiency: Secondary | ICD-10-CM | POA: Diagnosis not present

## 2016-12-21 DIAGNOSIS — D500 Iron deficiency anemia secondary to blood loss (chronic): Secondary | ICD-10-CM

## 2016-12-21 DIAGNOSIS — R5382 Chronic fatigue, unspecified: Secondary | ICD-10-CM | POA: Diagnosis not present

## 2016-12-21 DIAGNOSIS — D229 Melanocytic nevi, unspecified: Secondary | ICD-10-CM | POA: Diagnosis not present

## 2016-12-21 DIAGNOSIS — N921 Excessive and frequent menstruation with irregular cycle: Secondary | ICD-10-CM

## 2016-12-21 LAB — CBC WITH DIFFERENTIAL (CANCER CENTER ONLY)
BASO#: 0.1 10*3/uL (ref 0.0–0.2)
BASO%: 1.9 % (ref 0.0–2.0)
EOS%: 9.7 % — AB (ref 0.0–7.0)
Eosinophils Absolute: 0.5 10*3/uL (ref 0.0–0.5)
HCT: 40.6 % (ref 34.8–46.6)
HEMOGLOBIN: 13.3 g/dL (ref 11.6–15.9)
LYMPH#: 1.5 10*3/uL (ref 0.9–3.3)
LYMPH%: 28.2 % (ref 14.0–48.0)
MCH: 29.8 pg (ref 26.0–34.0)
MCHC: 32.8 g/dL (ref 32.0–36.0)
MCV: 91 fL (ref 81–101)
MONO#: 0.4 10*3/uL (ref 0.1–0.9)
MONO%: 8 % (ref 0.0–13.0)
NEUT%: 52.2 % (ref 39.6–80.0)
NEUTROS ABS: 2.7 10*3/uL (ref 1.5–6.5)
PLATELETS: 403 10*3/uL — AB (ref 145–400)
RBC: 4.46 10*6/uL (ref 3.70–5.32)
RDW: 13.7 % (ref 11.1–15.7)
WBC: 5.2 10*3/uL (ref 3.9–10.0)

## 2016-12-21 LAB — IRON AND TIBC
%SAT: 37 % (ref 21–57)
Iron: 98 ug/dL (ref 41–142)
TIBC: 265 ug/dL (ref 236–444)
UIBC: 167 ug/dL (ref 120–384)

## 2016-12-21 LAB — FERRITIN: FERRITIN: 72 ng/mL (ref 9–269)

## 2016-12-21 NOTE — Progress Notes (Signed)
Hematology and Oncology Follow Up Visit  April Vega [id number] 15-Oct-1970 46 y.o. 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 She is feeling fatigued and having chills. She states that her cycles ave been heavy lasting 7-8 days. She is on an oral contraceptive.  No fever, n/v, cough, rash, dizziness, headache, SOB, chest pain, palpitations, abdominal pain or changes in bowel or bladder habits.  She has requested that we check her B12 and B6 levels today so they were added to her lab work.  No swelling, tenderness, numbness or tingling in her extremities. No joint aches or c/o pain at this time.  Her appetite comes and goes but she states that she is staying hydrated. Her weight is stable.   Medications:  Allergies as of 12/21/2016      Reactions   Latex Rash      Medication List       Accurate as of 12/21/16 10:01 AM. Always use your most recent med list.          FUSION PLUS Caps Take 1 capsule by mouth daily.   norgestrel-ethinyl estradiol 0.5-50 MG-MCG tablet Commonly known as:  OGESTROL Take 1 tablet by mouth daily.   Vitamin D (Ergocalciferol) 50000 units Caps capsule Commonly known as:  DRISDOL TAKE ONE CAPSULE BY MOUTH EVERY 7 DAYS       Allergies:  Allergies  Allergen Reactions  . Latex Rash    Past Medical History, Surgical history, Social history, and Family History were reviewed and updated.  Review of Systems: All other 10 point review of systems is negative.   Physical Exam:  vitals were not taken for this visit.  Wt Readings from Last 3 Encounters:  12/17/16 130 lb (59 kg)  10/12/16 129 lb (58.5 kg)  10/05/16 130 lb (59 kg)    Ocular: Sclerae unicteric, pupils equal, round and reactive to light Ear-nose-throat: Oropharynx clear, dentition fair Lymphatic:  No cervical supraclavicular or axillary adenopathy Lungs no rales or rhonchi, good excursion bilaterally Heart regular rate and rhythm, no murmur appreciated Abd soft, nontender, positive bowel sounds, no liver or spleen tip palpated on exam, no fluid wave MSK no focal spinal tenderness, no joint edema Neuro: non-focal, well-oriented, appropriate affect Breasts: Deferred  Lab Results  Component Value Date   WBC 5.2 12/21/2016   HGB 13.3 12/21/2016   HCT 40.6 12/21/2016   MCV 91 12/21/2016   PLT 403 (H) 12/21/2016   Lab Results  Component Value Date   FERRITIN 90 10/05/2016   IRON 97 10/05/2016   TIBC 279 10/05/2016   UIBC 182 10/05/2016   IRONPCTSAT 35 10/05/2016   Lab Results  Component Value Date   RBC 4.46 12/21/2016   No results found for: KPAFRELGTCHN, LAMBDASER, KAPLAMBRATIO No results found for: IGGSERUM, IGA, IGMSERUM No results found for: Odetta Pink, SPEI   Chemistry      Component Value Date/Time   NA 141 10/05/2016 1031   NA 140 01/02/2016 0928   K 4.3 10/05/2016 1031   K 3.8 01/02/2016 0928   CL 110 10/05/2016 1031   CO2 23 10/05/2016 1031   CO2 23 01/02/2016 0928   BUN 8 10/05/2016 1031   BUN 6.8 (L) 01/02/2016 0928   CREATININE 0.73 10/05/2016 1031  CREATININE 0.7 01/02/2016 0928      Component Value Date/Time   CALCIUM 9.0 10/05/2016 1031   CALCIUM 9.6 01/02/2016 0928   ALKPHOS 26 (L) 10/05/2016 1031   ALKPHOS 39 (L) 01/02/2016 0928   AST 11 10/05/2016 1031   AST 14 01/02/2016 0928   ALT 9 10/05/2016 1031   ALT 10 01/02/2016 0928   BILITOT 0.4 10/05/2016 1031   BILITOT 0.32 01/02/2016 0928     Impression and Plan: April Vega is a very pleasant 46 yo African American female with history of iron deficiency anemia secondary to metromenorrhagia. Her cycles continue to be heavy despite being on an oral contraceptive. She is following up with gynecology.  We will see what her iron studies  show and bring her back in later this week for an infusion if needed.  B 6 and 12 levels are pending.  We will plan to see her back in 4 months for labs and follow-up.  She will contact us with any questions or concerns. We can certainly see her sooner if need be.   Eliezer Bottom, NP 4/2/201810:01 AM

## 2016-12-22 ENCOUNTER — Telehealth: Payer: Self-pay

## 2016-12-22 LAB — VITAMIN B12: Vitamin B12: 756 pg/mL (ref 232–1245)

## 2016-12-22 NOTE — Telephone Encounter (Addendum)
-----   Message from Eliezer Bottom, NP sent at 12/21/2016  5:09 PM EDT ----- Regarding: iron  Iron studies stable. No infusion needed at this time. Thank you!  Sarah  Above message left on personalized VM with instructions to contact office for questions. dph

## 2016-12-23 ENCOUNTER — Encounter: Payer: Self-pay | Admitting: Physician Assistant

## 2016-12-23 ENCOUNTER — Ambulatory Visit (INDEPENDENT_AMBULATORY_CARE_PROVIDER_SITE_OTHER): Payer: Federal, State, Local not specified - PPO | Admitting: Physician Assistant

## 2016-12-23 VITALS — BP 111/75 | HR 99 | Ht 63.0 in | Wt 128.0 lb

## 2016-12-23 DIAGNOSIS — F319 Bipolar disorder, unspecified: Secondary | ICD-10-CM

## 2016-12-23 LAB — VITAMIN B6: VITAMIN B6: 11.8 ug/L (ref 2.0–32.8)

## 2016-12-23 MED ORDER — ARIPIPRAZOLE 5 MG PO TABS: 5.0000 mg | ORAL_TABLET | Freq: Every day | ORAL | 1 refills | Status: DC

## 2016-12-23 NOTE — Patient Instructions (Signed)
Bipolar 1 Disorder Bipolar 1 disorder is a mental health disorder in which a person has episodes of emotional highs (mania), and may also have episodes of emotional lows (depression) in addition to highs. Bipolar 1 disorder is different from other bipolar disorders because it involves extreme manic episodes. These episodes last at least one week or involve symptoms that are so severe that hospitalization is needed to keep the person safe. What increases the risk? The cause of this condition is not known. However, certain factors make you more likely to have bipolar disorder, such as:  Having a family member with the disorder.  An imbalance of certain chemicals in the brain (neurotransmitters).  Stress, such as illness, financial problems, or a death.  Certain conditions that affect the brain or spinal cord (neurologic conditions).  Brain injury (trauma).  Having another mental health disorder, such as:  Obsessive compulsive disorder.  Schizophrenia. What are the signs or symptoms? Symptoms of mania include:  Very high self-esteem or self-confidence.  Decreased need for sleep.  Unusual talkativeness or feeling a need to keep talking. Speech may be very fast. It may seem like you cannot stop talking.  Racing thoughts or constant talking, with quick shifts between topics that may or may not be related (flight of ideas).  Decreased ability to focus or concentrate.  Increased purposeful activity, such as work, studies, or social activity.  Increased nonproductive activity. This could be pacing, squirming and fidgeting, or finger and toe tapping.  Impulsive behavior and poor judgment. This may result in high-risk activities, such as having unprotected sex or spending a lot of money. Symptoms of depression include:  Feeling sad, hopeless, or helpless.  Frequent or uncontrollable crying.  Lack of feeling or caring about anything.  Sleeping too much.  Moving more slowly than  usual.  Not being able to enjoy things you used to enjoy.  Wanting to be alone all the time.  Feeling guilty or worthless.  Lack of energy or motivation.  Trouble concentrating or remembering.  Trouble making decisions.  Increased appetite.  Thoughts of death, or the desire to harm yourself. Sometimes, you may have a mixed mood. This means having symptoms of depression and mania. Stress can make symptoms worse. How is this diagnosed? To diagnose bipolar disorder, your health care provider may ask about your:  Emotional episodes.  Medical history.  Alcohol and drug use. This includes prescription medicines. Certain medical conditions and substances can cause symptoms that seem like bipolar disorder (secondary bipolar disorder). How is this treated? Bipolar disorder is a long-term (chronic) illness. It is best controlled with ongoing (continuous) treatment rather than treatment only when symptoms occur. Treatment may include:  Medicine. Medicine can be prescribed by a provider who specializes in treating mental disorders (psychiatrist).  Medicines called mood stabilizers are usually prescribed.  If symptoms occur even while taking a mood stabilizer, other medicines may be added.  Psychotherapy. Some forms of talk therapy, such as cognitive-behavioral therapy (CBT), can provide support, education, and guidance.  Coping methods, such as journaling or relaxation exercises. These may include:  Yoga.  Meditation.  Deep breathing.  Lifestyle changes, such as:  Limiting alcohol and drug use.  Exercising regularly.  Getting plenty of sleep.  Making healthy eating choices. A combination of medicine, talk therapy, and coping methods is best. A procedure in which electricity is applied to the brain through the scalp (electroconvulsive therapy) may be used in cases of severe mania when medicine and psychotherapy work too slowly  Limiting alcohol and drug use.  ? Exercising regularly.  ? Getting plenty of sleep.  ? Making healthy eating choices.    A combination of medicine, talk therapy, and coping methods is best. A procedure in which electricity is applied to the brain through the scalp (electroconvulsive therapy) may be used in cases of severe mania when medicine and psychotherapy work too slowly or do not work.  Follow these  instructions at home:  Activity     · Return to your normal activities as told by your health care provider.  · Find activities that you enjoy, and make time to do them.  · Exercise regularly as told by your health care provider.  Lifestyle   · Limit alcohol intake to no more than 1 drink a day for nonpregnant women and 2 drinks a day for men. One drink equals 12 oz of beer, 5 oz of wine, or 1½ oz of hard liquor.  · Follow a set schedule for eating and sleeping.  · Eat a balanced diet that includes fresh fruits and vegetables, whole grains, low-fat dairy, and lean meat.  · Get 7-8 hours of sleep each night.  General instructions   · Take over-the-counter and prescription medicines only as told by your health care provider.  · Think about joining a support group. Your health care provider may be able to recommend a support group.  · Talk with your family and loved ones about your treatment goals and how they can help.  · Keep all follow-up visits as told by your health care provider. This is important.  Where to find more information:  For more information about bipolar disorder, visit the following websites:  · National Alliance on Mental Illness: www.nami.org  · U.S. National Institute of Mental Health: www.nimh.nih.gov    Contact a health care provider if:  · Your symptoms get worse.  · You have side effects from your medicine, and they get worse.  · You have trouble sleeping.  · You have trouble doing daily activities.  · You feel unsafe in your surroundings.  · You are dealing with substance abuse.  Get help right away if:  · You have new symptoms.  · You have thoughts about harming yourself.  · You self-harm.  This information is not intended to replace advice given to you by your health care provider. Make sure you discuss any questions you have with your health care provider.  Document Released: 12/14/2000 Document Revised: 05/03/2016 Document Reviewed: 05/07/2016  Elsevier Interactive Patient Education ©  2017 Elsevier Inc.

## 2016-12-23 NOTE — Progress Notes (Signed)
   Subjective:    Patient ID: April Vega, female    DOB: September 05, 1971, 46 y.o.   MRN: [id number]  HPI  Pt is a 46 yo female who presents to the clinic to discuss anxiety, depression, focus. She has tried numerous SSRI's lexapro, celexa, viibryd, trintelix and then cymbalta, and wellbutrin. She has either had no benefit or side effects from medication. wellbutrin would give her temporary memory loss. She continues to struggle with no motivation to do anything. She does admit to periods of times where she feels really good.she admits to not needing as much sleep. She feels like she is doing really good then she drops really low and that is usually when she comes in to office. She denies any suicidal or homicidal thoughts.     Review of Systems    see HPI.  Objective:   Physical Exam  Constitutional: She is oriented to person, place, and time. She appears well-developed and well-nourished.  HENT:  Head: Normocephalic and atraumatic.  Cardiovascular: Normal rate, regular rhythm and normal heart sounds.   Neurological: She is alert and oriented to person, place, and time.  Psychiatric: She has a normal mood and affect. Her behavior is normal.          Assessment & Plan:  [name] KitchenDiagnoses and all orders for this visit:  Bipolar 1 disorder, depressed ([place])  Other orders -     ARIPiprazole (ABILIFY) 5 MG tablet; Take 1 tablet (5 mg total) by mouth daily.    .. Depression screen [facility] 2/9 12/23/2016 10/04/2013  Decreased Interest 3 0  Down, Depressed, Hopeless 2 0  PHQ - 2 Score 5 0  Tired, decreased energy 3 -  Change in appetite 2 -  Feeling bad or failure about yourself  0 -  Trouble concentrating 2 -  Moving slowly or fidgety/restless 0 -  Suicidal thoughts 0 -    MDQ- 6/13. No family hx of bipolar.   Hx of SSRI, SSNRI not working. Stimulants for ADHD did not treat symptoms. After discussing today I think she is showing signs of bipolar. Will start abilify. Discussed side  effects. Follow up in 4 weeks. Offered referral. She declined currently but if still not responding to treatment will need to refer out.

## 2016-12-24 ENCOUNTER — Telehealth: Payer: Self-pay | Admitting: Physician Assistant

## 2016-12-24 ENCOUNTER — Encounter: Payer: Self-pay | Admitting: Physician Assistant

## 2016-12-24 DIAGNOSIS — F988 Other specified behavioral and emotional disorders with onset usually occurring in childhood and adolescence: Secondary | ICD-10-CM

## 2016-12-24 DIAGNOSIS — F319 Bipolar disorder, unspecified: Secondary | ICD-10-CM

## 2016-12-24 DIAGNOSIS — F32A Depression, unspecified: Secondary | ICD-10-CM

## 2016-12-24 DIAGNOSIS — F411 Generalized anxiety disorder: Secondary | ICD-10-CM

## 2016-12-24 DIAGNOSIS — F329 Major depressive disorder, single episode, unspecified: Secondary | ICD-10-CM

## 2016-12-24 NOTE — Telephone Encounter (Signed)
Can we look into [facility] for any psych evaluations on patient. Per pt off [place].

## 2016-12-25 NOTE — Telephone Encounter (Signed)
Referral placed.

## 2016-12-25 NOTE — Telephone Encounter (Signed)
Routing to referral coordinator.

## 2017-01-15 ENCOUNTER — Ambulatory Visit: Payer: Federal, State, Local not specified - PPO

## 2017-01-29 ENCOUNTER — Ambulatory Visit: Payer: Federal, State, Local not specified - PPO

## 2017-02-03 ENCOUNTER — Ambulatory Visit (INDEPENDENT_AMBULATORY_CARE_PROVIDER_SITE_OTHER): Payer: Federal, State, Local not specified - PPO | Admitting: Physician Assistant

## 2017-02-03 ENCOUNTER — Ambulatory Visit: Payer: Federal, State, Local not specified - PPO

## 2017-02-03 ENCOUNTER — Encounter: Payer: Self-pay | Admitting: Physician Assistant

## 2017-02-03 VITALS — BP 126/76 | HR 85 | Ht 63.0 in | Wt 126.0 lb

## 2017-02-03 DIAGNOSIS — E538 Deficiency of other specified B group vitamins: Secondary | ICD-10-CM | POA: Diagnosis not present

## 2017-02-03 DIAGNOSIS — F319 Bipolar disorder, unspecified: Secondary | ICD-10-CM | POA: Diagnosis not present

## 2017-02-03 MED ORDER — LAMOTRIGINE 25 MG PO TABS: ORAL_TABLET | ORAL | 0 refills | Status: DC

## 2017-02-03 MED ORDER — CYANOCOBALAMIN 1000 MCG/ML IJ SOLN
1000.0000 ug | Freq: Once | INTRAMUSCULAR | Status: AC
  Administered 2017-02-03: 1000 ug via INTRAMUSCULAR

## 2017-02-03 NOTE — Progress Notes (Signed)
   Subjective:    Patient ID: April Vega, female    DOB: 04-02-1971, 46 y.o.   MRN: [id number]  HPI  Pt is a 46 yo female who presents to the clinic to follow up on starting Abilify. She thinks the medication is helping a lot with sadness and mood not really helping with focus. However she is really tired. She feels very drained since starting medication. She certainly feels like this is a side effect. If she didn't have the fatigue she would ask to increase medication.    Review of Systems See HPI.     Objective:   Physical Exam  Constitutional: She is oriented to person, place, and time. She appears well-developed and well-nourished.  Cardiovascular: Normal rate, regular rhythm and normal heart sounds.   Neurological: She is alert and oriented to person, place, and time.  Psychiatric: She has a normal mood and affect. Her behavior is normal.          Assessment & Plan:  [name] KitchenDiagnoses and all orders for this visit:  Bipolar 1 disorder, depressed ([place]) -     lamoTRIgine (LAMICTAL) 25 MG tablet; Take one tablet for 1 week, take two tablets daily.  B12 deficiency -     cyanocobalamin ((VITAMIN B-12)) injection 1,000 mcg; Inject 1 mL (1,000 mcg total) into the muscle once.   Decrease to 1/2 tablet of abilify since it is helping symptoms I do not want to stop completely. Start lamictal titrate up to 50mg  daily. Follow up in 4-6 weeks. Hopefully fatigue will improve if side effect of abilify. Pt declined psychiatrist appt today.   b12 shot given today. Need to recheck labs in near future.

## 2017-02-05 ENCOUNTER — Encounter: Payer: Self-pay | Admitting: Physician Assistant

## 2017-02-05 ENCOUNTER — Ambulatory Visit: Payer: Federal, State, Local not specified - PPO

## 2017-02-12 ENCOUNTER — Other Ambulatory Visit: Payer: Self-pay | Admitting: Physician Assistant

## 2017-02-14 ENCOUNTER — Other Ambulatory Visit: Payer: Self-pay | Admitting: Obstetrics & Gynecology

## 2017-02-14 DIAGNOSIS — N938 Other specified abnormal uterine and vaginal bleeding: Secondary | ICD-10-CM

## 2017-02-23 DIAGNOSIS — K080 Exfoliation of teeth due to systemic causes: Secondary | ICD-10-CM | POA: Diagnosis not present

## 2017-02-25 ENCOUNTER — Other Ambulatory Visit: Payer: Self-pay | Admitting: Physician Assistant

## 2017-02-25 DIAGNOSIS — F319 Bipolar disorder, unspecified: Secondary | ICD-10-CM

## 2017-03-08 ENCOUNTER — Ambulatory Visit (INDEPENDENT_AMBULATORY_CARE_PROVIDER_SITE_OTHER): Payer: Federal, State, Local not specified - PPO | Admitting: Physician Assistant

## 2017-03-08 ENCOUNTER — Ambulatory Visit: Payer: Federal, State, Local not specified - PPO | Admitting: Obstetrics & Gynecology

## 2017-03-08 ENCOUNTER — Other Ambulatory Visit: Payer: Self-pay | Admitting: Physician Assistant

## 2017-03-08 VITALS — BP 115/81 | HR 84 | Ht 62.21 in | Wt 126.0 lb

## 2017-03-08 DIAGNOSIS — E538 Deficiency of other specified B group vitamins: Secondary | ICD-10-CM

## 2017-03-08 DIAGNOSIS — Z9109 Other allergy status, other than to drugs and biological substances: Secondary | ICD-10-CM

## 2017-03-08 DIAGNOSIS — R41840 Attention and concentration deficit: Secondary | ICD-10-CM

## 2017-03-08 MED ORDER — CYANOCOBALAMIN 1000 MCG/ML IJ SOLN
1000.0000 ug | Freq: Once | INTRAMUSCULAR | Status: AC
  Administered 2017-03-08: 1000 ug via INTRAMUSCULAR

## 2017-03-08 NOTE — Patient Instructions (Signed)
Go have labs done prior to next B12 injection.

## 2017-03-08 NOTE — Progress Notes (Signed)
Pt here for B12 injection. She tolerated injection well, given in L deltoid. She was reminded to go have labs done prior to her next injection. Lab slip printed and given to patient. Pt to RTC in 1 month for next injection.Elouise Munroe'  Agree with above plan. Pending results of B12 level if increasing then may cut back to every 2 month shots. Iran Planas PA-C

## 2017-03-12 DIAGNOSIS — K080 Exfoliation of teeth due to systemic causes: Secondary | ICD-10-CM | POA: Diagnosis not present

## 2017-03-19 ENCOUNTER — Other Ambulatory Visit: Payer: Self-pay | Admitting: Physician Assistant

## 2017-03-19 DIAGNOSIS — F319 Bipolar disorder, unspecified: Secondary | ICD-10-CM

## 2017-03-22 ENCOUNTER — Ambulatory Visit: Payer: Federal, State, Local not specified - PPO | Admitting: Obstetrics & Gynecology

## 2017-03-22 ENCOUNTER — Encounter: Payer: Self-pay | Admitting: Obstetrics & Gynecology

## 2017-03-22 ENCOUNTER — Ambulatory Visit (INDEPENDENT_AMBULATORY_CARE_PROVIDER_SITE_OTHER): Payer: Federal, State, Local not specified - PPO | Admitting: Obstetrics & Gynecology

## 2017-03-22 VITALS — BP 118/77 | HR 76 | Ht 62.0 in | Wt 127.0 lb

## 2017-03-22 DIAGNOSIS — Z124 Encounter for screening for malignant neoplasm of cervix: Secondary | ICD-10-CM | POA: Diagnosis not present

## 2017-03-22 DIAGNOSIS — R300 Dysuria: Secondary | ICD-10-CM | POA: Diagnosis not present

## 2017-03-22 DIAGNOSIS — Z1151 Encounter for screening for human papillomavirus (HPV): Secondary | ICD-10-CM

## 2017-03-22 DIAGNOSIS — Z01419 Encounter for gynecological examination (general) (routine) without abnormal findings: Secondary | ICD-10-CM | POA: Diagnosis not present

## 2017-03-22 MED ORDER — LEVONORGEST-ETH ESTRAD 91-DAY 0.15-0.03 &0.01 MG PO TABS: 1.0000 | ORAL_TABLET | Freq: Every day | ORAL | 4 refills | Status: DC

## 2017-03-22 NOTE — Progress Notes (Signed)
Subjective:    April Vega is a 46 y.o. M AAP2 (50 and 1yo kids)  female who presents for an annual exam. Still on OCPs. The bleeding is still heavy but now only bleeds for 7-8 days versus the 13 days that she bled without the pills. The patient has no complaints today. The patient is sexually active. GYN screening history: last pap: was normal. The patient wears seatbelts: yes. The patient participates in regular exercise: yes. (burn bootcamp and multiple 5Ks) Has the patient ever been transfused or tattooed?: no. The patient reports that there is not domestic violence in her life.   Menstrual History: OB History    Gravida Para Term Preterm AB Living   3 2 2   1 2    SAB TAB Ectopic Multiple Live Births   1              Menarche age: 33 Patient's last menstrual period was 02/28/2017.    The following portions of the patient's history were reviewed and updated as appropriate: allergies, current medications, past family history, past medical history, past social history, past surgical history and problem list.  Review of Systems Pertinent items are noted in HPI.   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 :(   Objective:    BP 118/77   Pulse 76   Ht 5\' 2"  (1.575 m)   Wt 127 lb (57.6 kg)   LMP 02/28/2017   BMI 23.23 kg/m   General Appearance:    Alert, cooperative, no distress, appears stated age  Head:    Normocephalic, without obvious abnormality, atraumatic  Eyes:    PERRL, conjunctiva/corneas clear, EOM's intact, fundi    benign, both eyes  Ears:    Normal TM's and external ear canals, both ears  Nose:   Nares normal, septum midline, mucosa normal, no drainage    or sinus tenderness  Throat:   Lips, mucosa, and tongue normal; teeth and gums normal  Neck:   Supple, symmetrical, trachea midline, no adenopathy;    thyroid:  no enlargement/tenderness/nodules; no carotid   bruit or JVD  Back:      Symmetric, no curvature, ROM normal, no CVA tenderness  Lungs:     Clear to auscultation bilaterally, respirations unlabored  Chest Wall:    No tenderness or deformity   Heart:    Regular rate and rhythm, S1 and S2 normal, no murmur, rub   or gallop  Breast Exam:    No tenderness, masses, or nipple abnormality  Abdomen:     Soft, non-tender, bowel sounds active all four quadrants,    no masses, no organomegaly  Genitalia:    Normal female without lesion, discharge or tenderness, 2 hypopigmented areas on her right labia majora (I gave her a mirror to look). She reports that she never looks at her vulva and has no idea if they are new or not. NSSA, NT, mobile, no adnexal masses or tenderness     Extremities:   Extremities normal, atraumatic, no cyanosis or edema  Pulses:   2+ and symmetric all extremities  Skin:   Skin color, texture, turgor normal, no rashes or lesions  Lymph nodes:   Cervical, supraclavicular, and axillary nodes normal  Neurologic:   CNII-XII intact, normal strength, sensation and reflexes    throughout  .    Assessment:    Healthy female exam.   Mild dysuria   Plan:  Thin prep Pap smear. with cotesting Refill OCPs, change to [place] She may have refills annually until pap is due in 3 years. Check ua Rec SVE q month

## 2017-03-24 LAB — URINE CULTURE: Organism ID, Bacteria: NO GROWTH

## 2017-03-25 LAB — CYTOLOGY - PAP
Diagnosis: NEGATIVE
HPV (WINDOPATH): NOT DETECTED

## 2017-04-02 DIAGNOSIS — E538 Deficiency of other specified B group vitamins: Secondary | ICD-10-CM | POA: Diagnosis not present

## 2017-04-03 LAB — VITAMIN B12: VITAMIN B 12: 464 pg/mL (ref 200–1100)

## 2017-04-07 ENCOUNTER — Telehealth: Payer: Self-pay

## 2017-04-07 NOTE — Telephone Encounter (Signed)
Pt recently had her Vitamin B-12 lab checked per [facility]. Pt stated she was supposed to get a phone call to let her know if she's still supposed to get B-12 injections. Please advise.

## 2017-04-08 NOTE — Telephone Encounter (Signed)
B12 levels are normal, but in the absence of pernicious anemia she should be able to just supplement B12 orally.

## 2017-04-08 NOTE — Telephone Encounter (Signed)
Pt notified no questions or concerns at this time.

## 2017-04-09 ENCOUNTER — Ambulatory Visit: Payer: Federal, State, Local not specified - PPO

## 2017-04-17 ENCOUNTER — Other Ambulatory Visit: Payer: Self-pay | Admitting: Physician Assistant

## 2017-04-26 ENCOUNTER — Encounter: Payer: Self-pay | Admitting: Emergency Medicine

## 2017-04-26 ENCOUNTER — Emergency Department
Admission: EM | Admit: 2017-04-26 | Discharge: 2017-04-26 | Disposition: A | Discharge status: Home / Self Care | Payer: Federal, State, Local not specified - PPO | Source: Home / Self Care | Attending: Family Medicine | Admitting: Family Medicine

## 2017-04-26 DIAGNOSIS — K080 Exfoliation of teeth due to systemic causes: Secondary | ICD-10-CM | POA: Diagnosis not present

## 2017-04-26 DIAGNOSIS — M549 Dorsalgia, unspecified: Secondary | ICD-10-CM

## 2017-04-26 DIAGNOSIS — M545 Low back pain, unspecified: Secondary | ICD-10-CM

## 2017-04-26 LAB — POCT URINALYSIS DIP (MANUAL ENTRY)
Bilirubin, UA: NEGATIVE
Glucose, UA: NEGATIVE mg/dL
Leukocytes, UA: NEGATIVE
Nitrite, UA: NEGATIVE
Protein Ur, POC: NEGATIVE mg/dL
Spec Grav, UA: 1.02 (ref 1.010–1.025)
Urobilinogen, UA: 0.2 E.U./dL
pH, UA: 6.5 (ref 5.0–8.0)

## 2017-04-26 LAB — POCT URINE PREGNANCY: Preg Test, Ur: NEGATIVE

## 2017-04-26 MED ORDER — CYCLOBENZAPRINE HCL 10 MG PO TABS: 10.0000 mg | ORAL_TABLET | Freq: Two times a day (BID) | ORAL | 0 refills | Status: DC | PRN

## 2017-04-26 NOTE — ED Triage Notes (Signed)
Low right back pain started yesterday. Left buttock pain. Wants STD testing. Polyuria

## 2017-04-26 NOTE — Discharge Instructions (Signed)
°  You may take 500mg  acetaminophen every 4-6 hours or in combination with ibuprofen 400-600mg  every 6-8 hours as needed for pain.  Flexeril (cyclobenzaprine) is a muscle relaxer and may cause drowsiness. Do not drink alcohol, drive, or operate heavy machinery while taking.

## 2017-04-26 NOTE — ED Provider Notes (Signed)
CSN: [id number]     Arrival date & time 04/26/17  1008 History   First MD Initiated Contact with Patient 04/26/17 1030     Chief Complaint  Patient presents with  . Back Pain   (Consider location/radiation/quality/duration/timing/severity/associated sxs/prior Treatment) HPI April Vega is a 46 y.o. female presenting to UC with c/o low back pain on Right side that started yesterday while she was sitting down. Pain is aching and sharp at times with certain movements, 0/35 at this time but was 5/10 last night. Pain is worse with movement, especially with rotation at the waist to the Left.  Pain did improve with ibuprofen taken last night. Denies urinary or vaginal symptoms on exam but did mention polyuria in triage and requested her urine be tested and to be tested for STDs. Pt had unprotected intercourse about 2 weeks ago but no known exposure. Pt also states she had a pelvic exam last month so she does not want one today but states she is unsure if they checked for STDs at that time. Denies fever, chills, n/v/d. No known injuries.      Past Medical History:  Diagnosis Date  . ADHD (attention deficit hyperactivity disorder)   . Anemia   . 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 Menometrorrhagia 01/02/2016   Past Surgical History:  Procedure Laterality Date  . bone graph  01/1990  . CESAREAN SECTION     Family History  Problem Relation Age of Onset  . Diabetes Mother   . Hypertension Mother    Social History  Substance Use Topics  . Smoking status: Never Smoker  . Smokeless tobacco: Never Used  . Alcohol use No   OB History    Gravida Para Term Preterm AB Living   3 2 2   1 2    SAB TAB Ectopic Multiple Live Births   1             Review of Systems  Constitutional: Negative for chills and fever.  Gastrointestinal: Negative for abdominal pain, diarrhea, nausea and vomiting.  Genitourinary: Negative for decreased urine volume, dysuria, frequency, hematuria, pelvic pain, urgency,  vaginal bleeding, vaginal discharge and vaginal pain.  Musculoskeletal: Positive for back pain and myalgias.  Skin: Negative for rash.  Neurological: Negative for weakness and numbness.    Allergies  Wellbutrin [bupropion] and Latex  Home Medications   Prior to Admission medications   Medication Sig Start Date End Date Taking? Authorizing Provider  ARIPiprazole (ABILIFY) 5 MG tablet Take 1 tablet (5 mg total) by mouth daily. NEEDS APPOINTMENT FOR FUTURE REFILLS 04/19/17   Silverio Decamp, MD  cyclobenzaprine (FLEXERIL) 10 MG tablet Take 1 tablet (10 mg total) by mouth 2 (two) times daily as needed. 04/26/17   Noe Gens, PA-C  Iron-FA-B Cmp-C-Biot-Probiotic (FUSION PLUS) CAPS Take 1 capsule by mouth daily. 10/05/16   [name], Royetta Car, PA-C  Levonorgestrel-Ethinyl Estradiol (AMETHIA,CAMRESE) 0.15-0.03 &0.01 MG tablet Take 1 tablet by mouth daily. 03/22/17   Emily Filbert, MD  Vitamin D, Ergocalciferol, (DRISDOL) 50000 units CAPS capsule TAKE ONE CAPSULE BY MOUTH EVERY 7 DAYS 10/04/16   [place], Holli Humbles, NP   Meds Ordered and Administered this Visit  Medications - No data to display  BP 125/83 (BP Location: Left Arm)   Pulse 79   Temp 98.6 F (37 C) (Oral)   Ht 5\' 3"  (1.6 m)   Wt 127 lb (57.6 kg)   LMP 03/30/2017 (Exact Date)   SpO2 98%   BMI 22.50  kg/m  No data found.   Physical Exam  Constitutional: She is oriented to person, place, and time. She appears well-developed and well-nourished. No distress.  HENT:  Head: Normocephalic and atraumatic.  Mouth/Throat: Oropharynx is clear and moist.  Eyes: EOM are normal.  Neck: Normal range of motion. Neck supple.  Cardiovascular: Normal rate and regular rhythm.   Pulmonary/Chest: Effort normal and breath sounds normal. No respiratory distress. She has no wheezes. She has no rales.  Abdominal: Soft. Bowel sounds are normal. She exhibits no distension and no mass. There is no tenderness. There is no rebound, no guarding and no  CVA tenderness.  Musculoskeletal: Normal range of motion. She exhibits tenderness. She exhibits no edema.  No midline spinal tenderness. Tenderness on Right side of back from upper trapezius, mid thoracic and lower lumbar muscles. Full ROM upper and lower extremities bilaterally. Negative straight leg raise.   Neurological: She is alert and oriented to person, place, and time.  Skin: Skin is warm and dry. She is not diaphoretic.  Psychiatric: She has a normal mood and affect. Her behavior is normal.  Nursing note and vitals reviewed.   Urgent Care Course     Procedures (including critical care time)  Labs Review Labs Reviewed  GC/CHLAMYDIA PROBE AMP  URINE CULTURE  POCT URINALYSIS DIP (MANUAL ENTRY)  POCT URINE PREGNANCY    Imaging Review No results found.    MDM   1. Acute right-sided low back pain without sciatica   2. Mid back pain on right side   3. Upper back pain on right side    Pt c/o Right sided back pain. Denies urinary or vaginal symptoms.  Hx and exam most c/w muscle strain. Pain reproducible with palpation of back muscles.  No red flag symptoms. No bony tenderness.  UA: small blood and protein. Denies hx of kidney stones. Urine GC/chlamdyia sent, pt declined pelvic exam.  Rx: Flexeril Alternate acetaminophen and ibuprofen F/u with PCP in 1 week if not improving.     Noe Gens, [place] 04/26/17 1047

## 2017-04-27 LAB — GC/CHLAMYDIA PROBE AMP
CT Probe RNA: NOT DETECTED
GC Probe RNA: NOT DETECTED

## 2017-04-27 LAB — URINE CULTURE: Organism ID, Bacteria: NO GROWTH

## 2017-04-28 ENCOUNTER — Telehealth: Payer: Self-pay

## 2017-04-28 NOTE — Telephone Encounter (Signed)
Notified patient of lab results.  She is feeling better.  Will follow up as needed.

## 2017-04-29 ENCOUNTER — Ambulatory Visit: Payer: Federal, State, Local not specified - PPO

## 2017-04-29 ENCOUNTER — Ambulatory Visit (INDEPENDENT_AMBULATORY_CARE_PROVIDER_SITE_OTHER): Payer: Federal, State, Local not specified - PPO

## 2017-04-29 ENCOUNTER — Ambulatory Visit (INDEPENDENT_AMBULATORY_CARE_PROVIDER_SITE_OTHER): Payer: Federal, State, Local not specified - PPO | Admitting: Physician Assistant

## 2017-04-29 ENCOUNTER — Encounter: Payer: Self-pay | Admitting: Physician Assistant

## 2017-04-29 VITALS — BP 110/74 | HR 99 | Temp 97.5°F | Ht 64.0 in | Wt 129.0 lb

## 2017-04-29 DIAGNOSIS — R600 Localized edema: Secondary | ICD-10-CM | POA: Diagnosis not present

## 2017-04-29 DIAGNOSIS — I82412 Acute embolism and thrombosis of left femoral vein: Secondary | ICD-10-CM

## 2017-04-29 DIAGNOSIS — M25552 Pain in left hip: Secondary | ICD-10-CM

## 2017-04-29 MED ORDER — TRAMADOL HCL 50 MG PO TABS: 25.0000 mg | ORAL_TABLET | Freq: Four times a day (QID) | ORAL | 0 refills | Status: DC | PRN

## 2017-04-29 MED ORDER — APIXABAN 5 MG PO TABS: ORAL_TABLET | ORAL | 1 refills | Status: DC

## 2017-04-29 NOTE — Progress Notes (Addendum)
   Subjective:    I'm seeing this patient as a consultation for:  Nelson Chimes, PA-C  CC: Left hip pain  HPI: For one to 2 days this 46 year old female with a baseline history of significant left hip, and lower leg trauma has had left hip and leg pain and swelling. She denies any periods of immobilization, she is on birth control but has no other history of blood clots. Pain is localized in the groin, moderate, persistent worst with resisted hip flexion.  Past medical history, Surgical history, Family history not pertinant except as noted below, Social history, Allergies, and medications have been entered into the medical record, reviewed, and no changes needed.   Review of Systems: No headache, visual changes, nausea, vomiting, diarrhea, constipation, dizziness, abdominal pain, skin rash, fevers, chills, night sweats, weight loss, swollen lymph nodes, body aches, joint swelling, muscle aches, chest pain, shortness of breath, mood changes, visual or auditory hallucinations.   Objective:   General: Well Developed, well nourished, and in no acute distress.  Neuro:  Extra-ocular muscles intact, able to move all 4 extremities, sensation grossly intact.  Deep tendon reflexes tested were normal. Psych: Alert and oriented, mood congruent with affect. ENT:  Ears and nose appear unremarkable.  Hearing grossly normal. Neck: Unremarkable overall appearance, trachea midline.  No visible thyroid enlargement. Eyes: Conjunctivae and lids appear unremarkable.  Pupils equal and round. Skin: Warm and dry, no rashes noted.  Cardiovascular: Pulses palpable, no extremity edema. Left Hip: ROM IR: 60 but painful, ER: 60 Deg, Flexion: 120 Deg, Extension: 100 Deg, Abduction: 45 Deg, Adduction: 45 Deg Strength IR: 5/5, ER: 5/5, Flexion: 5/5, but painful when resisted, Extension: 5/5, Abduction: 5/5, Adduction: 5/5 Pelvic alignment unremarkable to inspection and palpation. Standing hip rotation and gait  without trendelenburg / unsteadiness. Greater trochanter without tenderness to palpation. No tenderness over piriformis. No SI joint tenderness and normal minimal SI movement. Lower extremity below the hip is really not all that swollen, she's got a negative Homans sign, and no pain with compression through the thigh.  Impression and Recommendations:   This case required medical decision making of moderate complexity.  Left hip pain Suspect hip joint versus flexor related pathology. Hip x-rays.  She does have baseline left leg swelling due to extensive trauma in the distant past. We are going to go ahead and get a left lower extremity DVT ultrasound. I do suspect we will be doing NSAIDs and physical therapy.  Ultrasound is show a left femoral to distal DVT. Further treatment per primary treating provider, my recommendation is Eliquis.
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
## 2017-04-29 NOTE — Patient Instructions (Signed)
Deep Vein Thrombosis A deep vein thrombosis (DVT) is a blood clot (thrombus) that usually occurs in a deep, larger vein of the lower leg or the pelvis, or in an upper extremity such as the arm. These are dangerous and can lead to serious and even life-threatening complications if the clot travels to the lungs. A DVT can damage the valves in your leg veins so that instead of flowing upward, the blood pools in the lower leg. This is called post-thrombotic syndrome, and it can result in pain, swelling, discoloration, and sores on the leg. What are the causes? A DVT is caused by the formation of a blood clot in your leg, pelvis, or arm. Usually, several things contribute to the formation of blood clots. A clot may develop when:  Your blood flow slows down.  Your vein becomes damaged in some way.  You have a condition that makes your blood clot more easily.  What increases the risk? A DVT is more likely to develop in:  People who are older, especially over 60 years of age.  People who are overweight (obese).  People who sit or lie still for a long time, such as during long-distance travel (over 4 hours), bed rest, hospitalization, or during recovery from certain medical conditions like a stroke.  People who do not engage in much physical activity (sedentary lifestyle).  People who have chronic breathing disorders.  People who have a personal or family history of blood clots or blood clotting disease.  People who have peripheral vascular disease (PVD), diabetes, or some types of cancer.  People who have heart disease, especially if the person had a recent heart attack or has congestive heart failure.  People who have neurological diseases that affect the legs (leg paresis).  People who have had a traumatic injury, such as breaking a hip or leg.  People who have recently had major or lengthy surgery, especially on the hip, knee, or abdomen.  People who have had a central line placed  inside a large vein.  People who take medicines that contain the hormone estrogen. These include birth control pills and hormone replacement therapy.  Pregnancy or during childbirth or the postpartum period.  Long plane flights (over 8 hours).  What are the signs or symptoms?  Symptoms of a DVT can include:  Swelling of your leg or arm, especially if one side is much worse.  Warmth and redness of your leg or arm, especially if one side is much worse.  Pain in your arm or leg. If the clot is in your leg, symptoms may be more noticeable or worse when you stand or walk.  A feeling of pins and needles, if the clot is in the arm.  The symptoms of a DVT that has traveled to the lungs (pulmonary embolism, PE) usually start suddenly and include:  Shortness of breath while active or at rest.  Coughing or coughing up blood or blood-tinged mucus.  Chest pain that is often worse with deep breaths.  Rapid or irregular heartbeat.  Feeling light-headed or dizzy.  Fainting.  Feeling anxious.  Sweating.  There may also be pain and swelling in a leg if that is where the blood clot started. These symptoms may represent a serious problem that is an emergency. Do not wait to see if the symptoms will go away. Get medical help right away. Call your local emergency services (911 in the [place] Do not drive yourself to the hospital. How is this diagnosed? Your health   care provider will take a medical history and perform a physical exam. You may also have other tests, including:  Blood tests to assess the clotting properties of your blood.  Imaging tests, such as CT, ultrasound, MRI, X-ray, and other tests to see if you have clots anywhere in your body.  How is this treated? After a DVT is identified, it can be treated. The type of treatment that you receive depends on many factors, such as the cause of your DVT, your risk for bleeding or developing more clots, and other medical conditions that  you have. Sometimes, a combination of treatments is necessary. Treatment options may be combined and include:  Monitoring the blood clot with ultrasound.  Taking medicines by mouth, such as newer blood thinners (anticoagulants), thrombolytics, or warfarin.  Taking anticoagulant medicine by injection or through an IV tube.  Wearing compression stockings or using different types ofdevices.  Surgery (rare) to remove the blood clot or to place a filter in your abdomen to stop the blood clot from traveling to your lungs.  Treatments for a DVT are often divided into immediate treatment and long-term treatment (up to 3 months after DVT). You can work with your health care provider to choose the treatment program that is best for you. Follow these instructions at home: If you are taking a newer oral anticoagulant:  Take the medicine every single day at the same time each day.  Understand what foods and drugs interact with this medicine.  Understand that there are no regular blood tests required when using this medicine.  Understand the side effects of this medicine, including excessive bruising or bleeding. Ask your health care provider or pharmacist about other possible side effects. If you are taking warfarin:  Understand how to take warfarin and know which foods can affect how warfarin works in your body.  Understand that it is dangerous to take too much or too little warfarin. Too much warfarin increases the risk of bleeding. Too little warfarin continues to allow the risk for blood clots.  Follow your PT and INR blood testing schedule. The PT and INR results allow your health care provider to adjust your dose of warfarin. It is very important that you have your PT and INR tested as often as told by your health care provider.  Avoid major changes in your diet, or tell your health care provider before you change your diet. Arrange a visit with a registered dietitian to answer your  questions. Many foods, especially foods that are high in vitamin K, can interfere with warfarin and affect the PT and INR results. Eat a consistent amount of foods that are high in vitamin K, such as: ? Spinach, kale, broccoli, cabbage, collard greens, turnip greens, Brussels sprouts, peas, cauliflower, seaweed, and parsley. ? Beef liver and pork liver. ? Green tea. ? Soybean oil.  Tell your health care provider about any and all medicines, vitamins, and supplements that you take, including aspirin and other over-the-counter anti-inflammatory medicines. Be especially cautious with aspirin and anti-inflammatory medicines. Do not take those before you ask your health care provider if it is safe to do so. This is important because many medicines can interfere with warfarin and affect the PT and INR results.  Do not start or stop taking any over-the-counter or prescription medicine unless your health care provider or pharmacist tells you to do so. If you take warfarin, you will also need to do these things:  Hold pressure over cuts for longer than   usual.  Tell your dentist and other health care providers that you are taking warfarin before you have any procedures in which bleeding may occur.  Avoid alcohol or drink very small amounts. Tell your health care provider if you change your alcohol intake.  Do not use tobacco products, including cigarettes, chewing tobacco, and e-cigarettes. If you need help quitting, ask your health care provider.  Avoid contact sports.  General instructions  Take over-the-counter and prescription medicines only as told by your health care provider. Anticoagulant medicines can have side effects, including easy bruising and difficulty stopping bleeding. If you are prescribed an anticoagulant, you will also need to do these things: ? Hold pressure over cuts for longer than usual. ? Tell your dentist and other health care providers that you are taking anticoagulants  before you have any procedures in which bleeding may occur. ? Avoid contact sports.  Wear a medical alert bracelet or carry a medical alert card that says you have had a PE.  Ask your health care provider how soon you can go back to your normal activities. Stay active to prevent new blood clots from forming.  Make sure to exercise while traveling or when you have been sitting or standing for a long period of time. It is very important to exercise. Exercise your legs by walking or by tightening and relaxing your leg muscles often. Take frequent walks.  Wear compression stockings as told by your health care provider to help prevent more blood clots from forming.  Do not use tobacco products, including cigarettes, chewing tobacco, and e-cigarettes. If you need help quitting, ask your health care provider.  Keep all follow-up appointments with your health care provider. This is important. How is this prevented? Take these actions to decrease your risk of developing another DVT:  Exercise regularly. For at least 30 minutes every day, engage in: ? Activity that involves moving your arms and legs. ? Activity that encourages good blood flow through your body by increasing your heart rate.  Exercise your arms and legs every hour during long-distance travel (over 4 hours). Drink plenty of water and avoid drinking alcohol while traveling.  Avoid sitting or lying in bed for long periods of time without moving your legs.  Maintain a weight that is appropriate for your height. Ask your health care provider what weight is healthy for you.  If you are a woman who is over 35 years of age, avoid unnecessary use of medicines that contain estrogen. These include birth control pills.  Do not smoke, especially if you take estrogen medicines. If you need help quitting, ask your health care provider.  If you are hospitalized, prevention measures may include:  Early walking after surgery, as soon as your  health care provider says that it is safe.  Receiving anticoagulants to prevent blood clots.If you cannot take anticoagulants, other options may be available, such as wearing compression stockings or using different types of devices.  Get help right away if:  You have new or increased pain, swelling, or redness in an arm or leg.  You have numbness or tingling in an arm or leg.  You have shortness of breath while active or at rest.  You have chest pain.  You have a rapid or irregular heartbeat.  You feel light-headed or dizzy.  You cough up blood.  You notice blood in your vomit, bowel movement, or urine. These symptoms may represent a serious problem that is an emergency. Do not wait to see   if the symptoms will go away. Get medical help right away. Call your local emergency services (911 in the [place] Do not drive yourself to the hospital. This information is not intended to replace advice given to you by your health care provider. Make sure you discuss any questions you have with your health care provider. Document Released: 09/07/2005 Document Revised: 02/13/2016 Document Reviewed: 01/02/2015 Elsevier Interactive Patient Education  2017 [employer] Inc[employer]  
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
## 2017-05-01 DIAGNOSIS — Z79899 Other long term (current) drug therapy: Secondary | ICD-10-CM | POA: Diagnosis not present

## 2017-05-01 DIAGNOSIS — Z7901 Long term (current) use of anticoagulants: Secondary | ICD-10-CM | POA: Diagnosis not present

## 2017-05-01 DIAGNOSIS — M7989 Other specified soft tissue disorders: Secondary | ICD-10-CM | POA: Diagnosis not present

## 2017-05-01 DIAGNOSIS — Z91040 Latex allergy status: Secondary | ICD-10-CM | POA: Diagnosis not present

## 2017-05-01 DIAGNOSIS — I82402 Acute embolism and thrombosis of unspecified deep veins of left lower extremity: Secondary | ICD-10-CM | POA: Diagnosis not present

## 2017-05-03 ENCOUNTER — Other Ambulatory Visit: Payer: Federal, State, Local not specified - PPO

## 2017-05-03 ENCOUNTER — Ambulatory Visit: Payer: Federal, State, Local not specified - PPO | Admitting: Family

## 2017-05-05 ENCOUNTER — Ambulatory Visit (INDEPENDENT_AMBULATORY_CARE_PROVIDER_SITE_OTHER): Payer: Federal, State, Local not specified - PPO | Admitting: Physician Assistant

## 2017-05-05 ENCOUNTER — Encounter: Payer: Self-pay | Admitting: Physician Assistant

## 2017-05-05 ENCOUNTER — Observation Stay (HOSPITAL_COMMUNITY)
Admission: EM | Admit: 2017-05-05 | Discharge: 2017-05-06 | Disposition: A | Discharge status: Home / Self Care | Payer: Federal, State, Local not specified - PPO | Attending: Family Medicine | Admitting: Family Medicine

## 2017-05-05 ENCOUNTER — Encounter (HOSPITAL_COMMUNITY): Payer: Self-pay | Admitting: *Deleted

## 2017-05-05 ENCOUNTER — Ambulatory Visit: Payer: Federal, State, Local not specified - PPO

## 2017-05-05 VITALS — BP 129/86 | HR 96 | Ht 64.0 in | Wt 128.0 lb

## 2017-05-05 DIAGNOSIS — Z888 Allergy status to other drugs, medicaments and biological substances status: Secondary | ICD-10-CM | POA: Insufficient documentation

## 2017-05-05 DIAGNOSIS — R000 Tachycardia, unspecified: Secondary | ICD-10-CM | POA: Diagnosis not present

## 2017-05-05 DIAGNOSIS — I82412 Acute embolism and thrombosis of left femoral vein: Principal | ICD-10-CM | POA: Insufficient documentation

## 2017-05-05 DIAGNOSIS — M79605 Pain in left leg: Secondary | ICD-10-CM | POA: Diagnosis not present

## 2017-05-05 DIAGNOSIS — I82432 Acute embolism and thrombosis of left popliteal vein: Secondary | ICD-10-CM | POA: Diagnosis not present

## 2017-05-05 DIAGNOSIS — M7989 Other specified soft tissue disorders: Secondary | ICD-10-CM | POA: Diagnosis not present

## 2017-05-05 DIAGNOSIS — N920 Excessive and frequent menstruation with regular cycle: Secondary | ICD-10-CM | POA: Diagnosis not present

## 2017-05-05 DIAGNOSIS — Z79899 Other long term (current) drug therapy: Secondary | ICD-10-CM | POA: Insufficient documentation

## 2017-05-05 DIAGNOSIS — K219 Gastro-esophageal reflux disease without esophagitis: Secondary | ICD-10-CM | POA: Insufficient documentation

## 2017-05-05 DIAGNOSIS — E530 Riboflavin deficiency: Secondary | ICD-10-CM | POA: Insufficient documentation

## 2017-05-05 DIAGNOSIS — Z7902 Long term (current) use of antithrombotics/antiplatelets: Secondary | ICD-10-CM | POA: Insufficient documentation

## 2017-05-05 DIAGNOSIS — Z91040 Latex allergy status: Secondary | ICD-10-CM | POA: Insufficient documentation

## 2017-05-05 DIAGNOSIS — D509 Iron deficiency anemia, unspecified: Secondary | ICD-10-CM | POA: Insufficient documentation

## 2017-05-05 DIAGNOSIS — E559 Vitamin D deficiency, unspecified: Secondary | ICD-10-CM | POA: Insufficient documentation

## 2017-05-05 DIAGNOSIS — M62838 Other muscle spasm: Secondary | ICD-10-CM | POA: Diagnosis not present

## 2017-05-05 DIAGNOSIS — F319 Bipolar disorder, unspecified: Secondary | ICD-10-CM

## 2017-05-05 DIAGNOSIS — M25569 Pain in unspecified knee: Secondary | ICD-10-CM | POA: Insufficient documentation

## 2017-05-05 DIAGNOSIS — G5790 Unspecified mononeuropathy of unspecified lower limb: Secondary | ICD-10-CM | POA: Insufficient documentation

## 2017-05-05 DIAGNOSIS — I82409 Acute embolism and thrombosis of unspecified deep veins of unspecified lower extremity: Secondary | ICD-10-CM | POA: Diagnosis present

## 2017-05-05 HISTORY — DX: Acute embolism and thrombosis of unspecified deep veins of unspecified lower extremity: I82.409

## 2017-05-05 LAB — COMPREHENSIVE METABOLIC PANEL
ALBUMIN: 3.3 g/dL — AB (ref 3.5–5.0)
ALT: 16 U/L (ref 14–54)
ANION GAP: 10 (ref 5–15)
AST: 19 U/L (ref 15–41)
Alkaline Phosphatase: 49 U/L (ref 38–126)
BILIRUBIN TOTAL: 0.7 mg/dL (ref 0.3–1.2)
BUN: 5 mg/dL — ABNORMAL LOW (ref 6–20)
CHLORIDE: 106 mmol/L (ref 101–111)
CO2: 23 mmol/L (ref 22–32)
Calcium: 9.3 mg/dL (ref 8.9–10.3)
Creatinine, Ser: 0.75 mg/dL (ref 0.44–1.00)
GFR calc Af Amer: >60 mL/min (ref >60)
GLUCOSE: 92 mg/dL (ref 65–99)
POTASSIUM: 4.1 mmol/L (ref 3.5–5.1)
Sodium: 139 mmol/L (ref 135–145)
Total Protein: 7.4 g/dL (ref 6.5–8.1)

## 2017-05-05 LAB — CBC WITH DIFFERENTIAL/PLATELET
BASOS ABS: 0.1 10*3/uL (ref 0.0–0.1)
BASOS PCT: 1 %
EOS PCT: 3 %
Eosinophils Absolute: 0.3 10*3/uL (ref 0.0–0.7)
HEMATOCRIT: 38.5 % (ref 36.0–46.0)
HEMOGLOBIN: 12.7 g/dL (ref 12.0–15.0)
LYMPHS ABS: 1.5 10*3/uL (ref 0.7–4.0)
LYMPHS PCT: 20 %
MCH: 28.9 pg (ref 26.0–34.0)
MCHC: 33 g/dL (ref 30.0–36.0)
MCV: 87.5 fL (ref 78.0–100.0)
MONO ABS: 0.8 10*3/uL (ref 0.1–1.0)
MONOS PCT: 10 %
Neutro Abs: 5 10*3/uL (ref 1.7–7.7)
Neutrophils Relative %: 66 %
Platelets: 716 10*3/uL — ABNORMAL HIGH (ref 150–400)
RBC: 4.4 MIL/uL (ref 3.87–5.11)
RDW: 13.7 % (ref 11.5–15.5)
WBC: 7.7 10*3/uL (ref 4.0–10.5)

## 2017-05-05 LAB — PROTIME-INR
INR: 1.39
Prothrombin Time: 17.2 seconds — ABNORMAL HIGH (ref 11.4–15.2)

## 2017-05-05 MED ORDER — ACETAMINOPHEN 325 MG PO TABS
650.0000 mg | ORAL_TABLET | ORAL | Status: DC | PRN
  Administered 2017-05-05 – 2017-05-06 (×3): 650 mg via ORAL
  Filled 2017-05-05 (×3): qty 2

## 2017-05-05 MED ORDER — ENOXAPARIN SODIUM 60 MG/0.6ML ~~LOC~~ SOLN
60.0000 mg | Freq: Two times a day (BID) | SUBCUTANEOUS | Status: DC
  Administered 2017-05-05 – 2017-05-06 (×2): 60 mg via SUBCUTANEOUS
  Filled 2017-05-05 (×2): qty 0.6

## 2017-05-05 MED ORDER — IBUPROFEN 600 MG PO TABS
600.0000 mg | ORAL_TABLET | Freq: Four times a day (QID) | ORAL | Status: DC | PRN
  Filled 2017-05-05: qty 1

## 2017-05-05 MED ORDER — AMBULATORY NON FORMULARY MEDICATION: 0 refills | Status: DC

## 2017-05-05 NOTE — ED Notes (Signed)
Urine specimen sitting on table @ bedside.

## 2017-05-05 NOTE — ED Notes (Signed)
Pt made aware of bed assignment 

## 2017-05-05 NOTE — Progress Notes (Signed)
Pt was called and msg left to go to [place] ED and to hold second dose of eliquis if not taken today. Will attempt another phone call in next 10 minutes.

## 2017-05-05 NOTE — ED Triage Notes (Signed)
PT was 6 days into taking eloquis repeat US showed dvt is getting larger.  PT was advised not to take 2nd dose today.  Recently changed birth control medication.

## 2017-05-05 NOTE — Progress Notes (Signed)
ANTICOAGULATION CONSULT NOTE - Initial Consult  Pharmacy Consult for lovenox Indication: DVT  Allergies  Allergen Reactions  . Wellbutrin [Bupropion]     Temporary memory loss  . Latex Rash    Patient Measurements: Height: 5\' 4"  (162.6 cm) Weight: 128 lb (58.1 kg) IBW/kg (Calculated) : 54.7 Heparin Dosing Weight: 58.1  Vital Signs: Temp: 98.5 F (36.9 C) (08/15 1448) Temp Source: Oral (08/15 1448) BP: 133/87 (08/15 1930) Pulse Rate: 109 (08/15 1930)  Labs:  Recent Labs  05/05/17 1505  HGB 12.7  HCT 38.5  PLT 716*  LABPROT 17.2*  INR 1.39  CREATININE 0.75    Estimated Creatinine Clearance: 74.3 mL/min (by C-G formula based on SCr of 0.75 mg/dL).   Medical History: Past Medical History:  Diagnosis Date  . ADHD (attention deficit hyperactivity disorder)   . Anemia   . DVT (deep venous thrombosis) ([place])   . 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 Menometrorrhagia 01/02/2016    Medications:  Scheduled:  Infusions:   Assessment: 88 YOF presenting to ED with worsening leg pain and swelling  s/p DVT diagnosis 8/9 and initiation of apixaban, last dose taken 8/15 at 0900.  No signs of bleeding noted at time.  Goal of Therapy:  Anti-Xa level 0.6-1 units/ml 4hrs after LMWH dose given Monitor platelets by anticoagulation protocol: Yes    Plan:  Start lovenox 60mg  IV q 12h Monitor CBC, s/s bleeding  Bertis Ruddy, PharmD Pharmacy Resident Pager #: [phone] 05/05/2017 8:33 PM
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
## 2017-05-06 ENCOUNTER — Observation Stay (HOSPITAL_BASED_OUTPATIENT_CLINIC_OR_DEPARTMENT_OTHER): Payer: Federal, State, Local not specified - PPO

## 2017-05-06 ENCOUNTER — Encounter (HOSPITAL_COMMUNITY): Payer: Self-pay | Admitting: *Deleted

## 2017-05-06 ENCOUNTER — Observation Stay (HOSPITAL_COMMUNITY): Payer: Federal, State, Local not specified - PPO

## 2017-05-06 DIAGNOSIS — N920 Excessive and frequent menstruation with regular cycle: Secondary | ICD-10-CM

## 2017-05-06 DIAGNOSIS — I82432 Acute embolism and thrombosis of left popliteal vein: Secondary | ICD-10-CM

## 2017-05-06 DIAGNOSIS — I82412 Acute embolism and thrombosis of left femoral vein: Secondary | ICD-10-CM | POA: Insufficient documentation

## 2017-05-06 DIAGNOSIS — M25569 Pain in unspecified knee: Secondary | ICD-10-CM

## 2017-05-06 DIAGNOSIS — I824Y9 Acute embolism and thrombosis of unspecified deep veins of unspecified proximal lower extremity: Secondary | ICD-10-CM

## 2017-05-06 DIAGNOSIS — F319 Bipolar disorder, unspecified: Secondary | ICD-10-CM

## 2017-05-06 LAB — CBC
HCT: 38 % (ref 36.0–46.0)
Hemoglobin: 12.2 g/dL (ref 12.0–15.0)
MCH: 28.4 pg (ref 26.0–34.0)
MCHC: 32.1 g/dL (ref 30.0–36.0)
MCV: 88.4 fL (ref 78.0–100.0)
PLATELETS: 759 10*3/uL — AB (ref 150–400)
RBC: 4.3 MIL/uL (ref 3.87–5.11)
RDW: 14.1 % (ref 11.5–15.5)
WBC: 7.3 10*3/uL (ref 4.0–10.5)

## 2017-05-06 LAB — HIV ANTIBODY (ROUTINE TESTING W REFLEX): HIV Screen 4th Generation wRfx: NONREACTIVE

## 2017-05-06 MED ORDER — RIVAROXABAN (XARELTO) VTE STARTER PACK (15 & 20 MG): ORAL_TABLET | ORAL | 0 refills | Status: DC

## 2017-05-06 NOTE — Progress Notes (Signed)
*  PRELIMINARY RESULTS* Vascular Ultrasound IVC/Iliac vein duplex has been completed.   The left common iliac and left external iliac veins are occluded with probable acute thrombus. The distal IVC is occluded, mid and proximal IVC is patent. The left common femoral and popliteal veins were evaluated for comparison to prior study 05/05/17 and found to still be occluded with acute thrombus. The right common iliac, right external iliac vein, and right common femoral vein are patent with no obvious evidence of acute thrombus.  05/06/2017 11:38 AM Maudry Mayhew, BS, RVT, RDCS, RDMS
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
## 2017-05-06 NOTE — Discharge Instructions (Signed)
[name],  You were seen today for a DVT at the hospital.  You are being discharge with a rivaroxaban starter pack at the [facility] on [address].  This will mean 21 days of taking (2) pills each day 12hrs apart.   Please start this at [facility].  Please take these pills with food.   On day 22 you will start taking (1) pill daily and the dose will change to 20mg .  You should see your pcp for followup on Monday, please call and schedule an appt with them.  You should stop taking your birth control pills until you have spoken with your obgyn and told them about your DVT and the rivaroxaban.   You'll need to discuss the pros and cons of other options with them.  Please immediately call 911 and come to the ED if you experience any sudden chest pain, difficulty breathing, stroke symptoms, inability to move your toes on the left foot or inability to feel your foot.  You will likely be on anticoagulation for 3 months so please discuss this with your pcp and make sure you get refills as need of your anticoagulation.
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
## 2017-05-10 ENCOUNTER — Encounter: Payer: Self-pay | Admitting: Physician Assistant

## 2017-05-10 ENCOUNTER — Ambulatory Visit (INDEPENDENT_AMBULATORY_CARE_PROVIDER_SITE_OTHER): Payer: Federal, State, Local not specified - PPO | Admitting: Physician Assistant

## 2017-05-10 ENCOUNTER — Ambulatory Visit: Payer: Federal, State, Local not specified - PPO | Admitting: Physician Assistant

## 2017-05-10 ENCOUNTER — Other Ambulatory Visit: Payer: Self-pay

## 2017-05-10 VITALS — BP 128/80 | HR 101 | Wt 129.0 lb

## 2017-05-10 DIAGNOSIS — F329 Major depressive disorder, single episode, unspecified: Secondary | ICD-10-CM | POA: Diagnosis not present

## 2017-05-10 DIAGNOSIS — R4589 Other symptoms and signs involving emotional state: Secondary | ICD-10-CM | POA: Insufficient documentation

## 2017-05-10 DIAGNOSIS — I82412 Acute embolism and thrombosis of left femoral vein: Secondary | ICD-10-CM

## 2017-05-10 DIAGNOSIS — I82432 Acute embolism and thrombosis of left popliteal vein: Secondary | ICD-10-CM

## 2017-05-10 MED ORDER — FLUOXETINE HCL 20 MG PO TABS: 20.0000 mg | ORAL_TABLET | Freq: Every day | ORAL | 1 refills | Status: DC
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
## 2017-05-11 ENCOUNTER — Inpatient Hospital Stay (HOSPITAL_COMMUNITY)
Admission: AD | Admit: 2017-05-11 | Discharge: 2017-05-13 | DRG: 271 | Disposition: A | Discharge status: Home / Self Care | Payer: Federal, State, Local not specified - PPO | Source: Ambulatory Visit | Attending: Surgery | Admitting: Surgery

## 2017-05-11 ENCOUNTER — Inpatient Hospital Stay (HOSPITAL_COMMUNITY)
Admission: AD | Disposition: A | Discharge status: Home / Self Care | Payer: Self-pay | Source: Ambulatory Visit | Attending: Surgery

## 2017-05-11 DIAGNOSIS — I745 Embolism and thrombosis of iliac artery: Secondary | ICD-10-CM | POA: Diagnosis present

## 2017-05-11 DIAGNOSIS — Z91040 Latex allergy status: Secondary | ICD-10-CM | POA: Diagnosis not present

## 2017-05-11 DIAGNOSIS — I82402 Acute embolism and thrombosis of unspecified deep veins of left lower extremity: Principal | ICD-10-CM | POA: Diagnosis present

## 2017-05-11 DIAGNOSIS — I82409 Acute embolism and thrombosis of unspecified deep veins of unspecified lower extremity: Secondary | ICD-10-CM | POA: Diagnosis present

## 2017-05-11 DIAGNOSIS — Z888 Allergy status to other drugs, medicaments and biological substances status: Secondary | ICD-10-CM | POA: Diagnosis not present

## 2017-05-11 DIAGNOSIS — D720 Genetic anomalies of leukocytes: Secondary | ICD-10-CM | POA: Diagnosis present

## 2017-05-11 DIAGNOSIS — F909 Attention-deficit hyperactivity disorder, unspecified type: Secondary | ICD-10-CM | POA: Diagnosis not present

## 2017-05-11 HISTORY — PX: PERIPHERAL VASCULAR THROMBECTOMY: CATH118306

## 2017-05-11 HISTORY — PX: LOWER EXTREMITY VENOGRAPHY: CATH118253

## 2017-05-11 LAB — CBC
HCT: 35.4 % — ABNORMAL LOW (ref 36.0–46.0)
HEMATOCRIT: 34.7 % — AB (ref 36.0–46.0)
HEMOGLOBIN: 11.5 g/dL — AB (ref 12.0–15.0)
Hemoglobin: 11.4 g/dL — ABNORMAL LOW (ref 12.0–15.0)
MCH: 28.5 pg (ref 26.0–34.0)
MCH: 28.7 pg (ref 26.0–34.0)
MCHC: 32.5 g/dL (ref 30.0–36.0)
MCHC: 32.9 g/dL (ref 30.0–36.0)
MCV: 87.8 fL (ref 78.0–100.0)
MCV: 87.4 fL (ref 78.0–100.0)
Platelets: 559 10*3/uL — ABNORMAL HIGH (ref 150–400)
Platelets: 526 10*3/uL — ABNORMAL HIGH (ref 150–400)
RBC: 4.03 MIL/uL (ref 3.87–5.11)
RBC: 3.97 MIL/uL (ref 3.87–5.11)
RDW: 13.8 % (ref 11.5–15.5)
RDW: 13.7 % (ref 11.5–15.5)
WBC: 12.6 10*3/uL — ABNORMAL HIGH (ref 4.0–10.5)
WBC: 10.7 10*3/uL — AB (ref 4.0–10.5)

## 2017-05-11 LAB — POCT I-STAT, CHEM 8
BUN: 6 mg/dL (ref 6–20)
CALCIUM ION: 1.16 mmol/L (ref 1.15–1.40)
CHLORIDE: 105 mmol/L (ref 101–111)
CREATININE: 0.6 mg/dL (ref 0.44–1.00)
Glucose, Bld: 83 mg/dL (ref 65–99)
HCT: 37 % (ref 36.0–46.0)
Hemoglobin: 12.6 g/dL (ref 12.0–15.0)
Potassium: 3.8 mmol/L (ref 3.5–5.1)
SODIUM: 140 mmol/L (ref 135–145)
TCO2: 26 mmol/L (ref 0–100)

## 2017-05-11 LAB — FIBRINOGEN
Fibrinogen: 479 mg/dL — ABNORMAL HIGH (ref 210–475)
Fibrinogen: 390 mg/dL (ref 210–475)

## 2017-05-11 LAB — APTT: aPTT: 43 seconds — ABNORMAL HIGH (ref 24–36)

## 2017-05-11 LAB — HEPARIN LEVEL (UNFRACTIONATED)
HEPARIN UNFRACTIONATED: 0.75 [IU]/mL — AB (ref 0.30–0.70)
HEPARIN UNFRACTIONATED: 0.5 [IU]/mL (ref 0.30–0.70)

## 2017-05-11 LAB — PREGNANCY, URINE: Preg Test, Ur: NEGATIVE

## 2017-05-11 SURGERY — LOWER EXTREMITY VENOGRAPHY
Anesthesia: LOCAL | Laterality: Left

## 2017-05-11 MED ORDER — ALTEPLASE 2 MG IJ SOLR
INTRAMUSCULAR | Status: DC
  Filled 2017-05-11: qty 20

## 2017-05-11 MED ORDER — HEPARIN SODIUM (PORCINE) 1000 UNIT/ML IJ SOLN
INTRAMUSCULAR | Status: AC
  Filled 2017-05-11: qty 1

## 2017-05-11 MED ORDER — SODIUM CHLORIDE 0.9 % IV SOLN
0.5000 mg/h | INTRAVENOUS | Status: DC
  Administered 2017-05-11 – 2017-05-12 (×2): 0.5 mg/h
  Filled 2017-05-11 (×3): qty 10

## 2017-05-11 MED ORDER — FENTANYL CITRATE (PF) 100 MCG/2ML IJ SOLN
INTRAMUSCULAR | Status: DC | PRN
  Administered 2017-05-11: 50 ug via INTRAVENOUS
  Administered 2017-05-11 (×3): 25 ug via INTRAVENOUS

## 2017-05-11 MED ORDER — FUSION PLUS PO CAPS: 1.0000 | ORAL_CAPSULE | Freq: Every day | ORAL | Status: DC

## 2017-05-11 MED ORDER — ADULT MULTIVITAMIN W/MINERALS CH
1.0000 | ORAL_TABLET | Freq: Every day | ORAL | Status: DC
  Administered 2017-05-13: 1 via ORAL
  Filled 2017-05-11: qty 1

## 2017-05-11 MED ORDER — CLONIDINE HCL 0.2 MG PO TABS: 0.2000 mg | ORAL_TABLET | ORAL | Status: DC | PRN

## 2017-05-11 MED ORDER — ONDANSETRON HCL 4 MG/2ML IJ SOLN: 4.0000 mg | Freq: Four times a day (QID) | INTRAMUSCULAR | Status: DC | PRN

## 2017-05-11 MED ORDER — HEPARIN (PORCINE) IN NACL 2-0.9 UNIT/ML-% IJ SOLN
INTRAMUSCULAR | Status: AC | PRN
  Administered 2017-05-11: 500 mL

## 2017-05-11 MED ORDER — HEPARIN (PORCINE) IN NACL 2-0.9 UNIT/ML-% IJ SOLN
INTRAMUSCULAR | Status: AC
  Filled 2017-05-11: qty 500

## 2017-05-11 MED ORDER — FENTANYL CITRATE (PF) 100 MCG/2ML IJ SOLN
INTRAMUSCULAR | Status: AC
  Filled 2017-05-11: qty 2

## 2017-05-11 MED ORDER — LIDOCAINE HCL (PF) 1 % IJ SOLN
INTRAMUSCULAR | Status: DC | PRN
  Administered 2017-05-11: 14 mL via SUBCUTANEOUS

## 2017-05-11 MED ORDER — SODIUM CHLORIDE 0.9 % IV SOLN
250.0000 mL | INTRAVENOUS | Status: DC | PRN
  Administered 2017-05-11 – 2017-05-12 (×2): 250 mL via INTRAVENOUS

## 2017-05-11 MED ORDER — HEPARIN (PORCINE) IN NACL 2-0.9 UNIT/ML-% IJ SOLN
INTRAMUSCULAR | Status: AC
  Filled 2017-05-11: qty 1000

## 2017-05-11 MED ORDER — HEPARIN SODIUM (PORCINE) 1000 UNIT/ML IJ SOLN
INTRAMUSCULAR | Status: DC | PRN
  Administered 2017-05-11: 5000 [IU] via INTRAVENOUS

## 2017-05-11 MED ORDER — FLUOXETINE HCL 20 MG PO CAPS
20.0000 mg | ORAL_CAPSULE | Freq: Every day | ORAL | Status: DC
  Administered 2017-05-13: 20 mg via ORAL
  Filled 2017-05-11 (×4): qty 1

## 2017-05-11 MED ORDER — MIDAZOLAM HCL 2 MG/2ML IJ SOLN
INTRAMUSCULAR | Status: DC | PRN
  Administered 2017-05-11 (×4): 1 mg via INTRAVENOUS

## 2017-05-11 MED ORDER — MORPHINE SULFATE (PF) 4 MG/ML IV SOLN
INTRAVENOUS | Status: AC
  Filled 2017-05-11: qty 2

## 2017-05-11 MED ORDER — MIDAZOLAM HCL 2 MG/2ML IJ SOLN
INTRAMUSCULAR | Status: AC
  Filled 2017-05-11: qty 2

## 2017-05-11 MED ORDER — MORPHINE SULFATE (PF) 10 MG/ML IV SOLN
5.0000 mg | INTRAVENOUS | Status: DC | PRN
  Administered 2017-05-11: 5 mg via INTRAVENOUS

## 2017-05-11 MED ORDER — HEPARIN (PORCINE) IN NACL 100-0.45 UNIT/ML-% IJ SOLN
1100.0000 [IU]/h | INTRAMUSCULAR | Status: DC
  Administered 2017-05-11: 800 [IU]/h via INTRAVENOUS
  Filled 2017-05-11 (×2): qty 250

## 2017-05-11 MED ORDER — SODIUM CHLORIDE 0.9% FLUSH
3.0000 mL | Freq: Two times a day (BID) | INTRAVENOUS | Status: DC
  Administered 2017-05-12: 3 mL via INTRAVENOUS

## 2017-05-11 MED ORDER — CYCLOBENZAPRINE HCL 10 MG PO TABS: 10.0000 mg | ORAL_TABLET | Freq: Three times a day (TID) | ORAL | Status: DC | PRN

## 2017-05-11 MED ORDER — MIDAZOLAM HCL 2 MG/2ML IJ SOLN: 1.0000 mg | INTRAMUSCULAR | Status: DC | PRN

## 2017-05-11 MED ORDER — LIDOCAINE HCL (PF) 1 % IJ SOLN
INTRAMUSCULAR | Status: AC
  Filled 2017-05-11: qty 30

## 2017-05-11 MED ORDER — SODIUM CHLORIDE 0.9 % IV SOLN
INTRAVENOUS | Status: DC
  Administered 2017-05-11 (×2): via INTRAVENOUS

## 2017-05-11 MED ORDER — MORPHINE SULFATE (PF) 4 MG/ML IV SOLN
5.0000 mg | INTRAVENOUS | Status: DC | PRN
  Administered 2017-05-11 – 2017-05-12 (×2): 5 mg via INTRAVENOUS
  Filled 2017-05-11 (×2): qty 2

## 2017-05-11 MED ORDER — SODIUM CHLORIDE 0.9% FLUSH
3.0000 mL | INTRAVENOUS | Status: DC | PRN
Start: 2017-05-11 — End: 2017-05-12

## 2017-05-11 MED ORDER — IODIXANOL 320 MG/ML IV SOLN
INTRAVENOUS | Status: DC | PRN
  Administered 2017-05-11: 25 mL via INTRAVENOUS

## 2017-05-11 SURGICAL SUPPLY — 17 items
BALLN MUSTANG 8X80X75 (BALLOONS) ×3
BALLOON MUSTANG 8X80X75 (BALLOONS) ×2 IMPLANT
CATH ANGIO 5F BER2 65CM (CATHETERS) ×3 IMPLANT
CATH INFUS 135CMX50CM (CATHETERS) ×3 IMPLANT
CATH VISIONS PV .035 IVUS (CATHETERS) ×3 IMPLANT
COVER PRB 48X5XTLSCP FOLD TPE (BAG) ×2 IMPLANT
COVER PROBE 5X48 (BAG) ×1
KIT ENCORE 26 ADVANTAGE (KITS) ×3 IMPLANT
KIT MICROINTRODUCER STIFF 5F (SHEATH) ×3 IMPLANT
KIT PV (KITS) ×3 IMPLANT
SET ZELANTE DVT THROMB (CATHETERS) ×3 IMPLANT
SHEATH PINNACLE 9F 10CM (SHEATH) ×3 IMPLANT
SHIELD RADPAD SCOOP 12X17 (MISCELLANEOUS) ×3 IMPLANT
TRAY PV CATH (CUSTOM PROCEDURE TRAY) ×3 IMPLANT
WIRE AMPLATZ SS-J .035X260CM (WIRE) ×6 IMPLANT
WIRE BENTSON .035X145CM (WIRE) ×3 IMPLANT
WIRE TORQFLEX AUST .018X40CM (WIRE) ×3 IMPLANT

## 2017-05-11 NOTE — Progress Notes (Signed)
 [facility]  Patient name: April Vega      MRN: [id number]        DOB: 08/20/1971          Sex: female   REASON FOR VISIT:    DVT  HISOTRY OF PRESENT ILLNESS:    April Vega is a 46 y.o. female who presented on August 6 with swelling in her left leg.  This was initially felt to be musculoskeletal, however a ultrasound revealed a DVT.  She was initially started on Eliquis, however she did not improve and so she was changed over to X[place].  Duplex ultrasound reveals occlusive thrombus from the common femoral vein to the popliteal vein.  The distal IVC is occluded as is the left common iliac and external iliac veins.  She denies any prior history of DVT or PE.  She has recently changed her oral contraceptive pill and July.  She denies any prolonged periods of inactivity.  There is no family history of clotting disorder.   PAST MEDICAL HISTORY:       Past Medical History:  Diagnosis Date  . ADHD (attention deficit hyperactivity disorder)   . Anemia   . DVT (deep venous thrombosis) ([facility])   . 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 Menometrorrhagia 01/02/2016     FAMILY HISTORY:        Family History  Problem Relation Age of Onset  . Diabetes Mother   . Hypertension Mother     SOCIAL HISTORY:       Social History  Substance Use Topics  . Smoking status: Never Smoker  . Smokeless tobacco: Never Used  . Alcohol use No     ALLERGIES:        Allergies  Allergen Reactions  . Wellbutrin [Bupropion] Other (See Comments)    Temporary memory loss  . Latex Rash     CURRENT MEDICATIONS:            Current Facility-Administered Medications  Medication Dose Route Frequency Provider Last Rate Last Dose  . 0.9 %  sodium chloride infusion   Intravenous Continuous Kanda Deluna W, MD 100 mL/hr at 05/11/17 1147      REVIEW OF SYSTEMS:   [X] denotes positive finding,  [ ] denotes negative finding Cardiac  Comments:  Chest pain or chest pressure:    Shortness of breath upon exertion:    Short of breath when lying flat:    Irregular heart rhythm:        Vascular    Pain in calf, thigh, or hip brought on by ambulation:    Pain in feet at night that wakes you up from your sleep:     Blood clot in your veins:    Leg swelling:  x       Pulmonary    Oxygen at home:    Productive cough:     Wheezing:         Neurologic    Sudden weakness in arms or legs:     Sudden numbness in arms or legs:     Sudden onset of difficulty speaking or slurred speech:    Temporary loss of vision in one eye:     Problems with dizziness:         Gastrointestinal    Blood in stool:     Vomited blood:         Genitourinary    Burning when urinating:     Blood in urine:          Psychiatric    Major depression:         Hematologic    Bleeding problems:    Problems with blood clotting too easily:        Skin    Rashes or ulcers:        Constitutional    Fever or chills:      PHYSICAL EXAM:      Vitals:   05/11/17 1025  BP: 116/74  Pulse: 88  Temp: 98.5 F (36.9 C)  TempSrc: Oral  SpO2: 100%  Weight: 126 lb (57.2 kg)  Height: 5' 2" (1.575 m)    GENERAL: The patient is a well-nourished female, in no acute distress. The vital signs are documented above. CARDIAC: There is a regular rate and rhythm.  VASCULAR: Significant left leg swelling.  Palpable pedal pulses. PULMONARY: Non-labored respirations MUSCULOSKELETAL: There are no major deformities or cyanosis. NEUROLOGIC: No focal weakness or paresthesias are detected. SKIN: There are no ulcers or rashes noted. PSYCHIATRIC: The patient has a normal affect.  STUDIES:    Duplex ultrasound reveals occlusive thrombus from the common femoral vein to the popliteal vein.  The distal IVC is occluded as is  the left common iliac and external iliac veins  MEDICAL ISSUES:   Left leg DVT: The patient will be brought in for left lower extremity venography with possible mechanical thrombectomy and thrombosed lysis.  I did discuss the possibility of infusion overnight with TPA.  We did discuss the risks and benefits including the risk of bleeding.  She wishes to proceed.    Wells Jayleigh Notarianni, MD [facility] Tel [phone] Pager [phone]  

## 2017-05-11 NOTE — Progress Notes (Addendum)
ANTICOAGULATION CONSULT NOTE - F/u Consult  Pharmacy Consult for heparin Indication: DVT  Allergies  Allergen Reactions  . Wellbutrin [Bupropion] Other (See Comments)    Temporary memory loss  . Latex Rash    Patient Measurements: Height: 5\' 2"  (157.5 cm) Weight: 126 lb (57.2 kg) IBW/kg (Calculated) : 50.1 Heparin Dosing Weight: 58.1  Vital Signs: Temp: 98.5 F (36.9 C) (08/21 2130) Temp Source: Oral (08/21 2130) BP: 126/79 (08/21 2130) Pulse Rate: 85 (08/21 2200)  Labs:  Recent Labs  05/11/17 1144 05/11/17 1805 05/11/17 2219 05/11/17 2220  HGB 12.6 11.5*  --  11.4*  HCT 37.0 35.4*  --  34.7*  PLT  --  559*  --  526*  HEPARINUNFRC  --  0.75* 0.50  --   CREATININE 0.60  --   --   --     Estimated Creatinine Clearance: 68 mL/min (by C-G formula based on SCr of 0.6 mg/dL).  Medical History: Past Medical History:  Diagnosis Date  . ADHD (attention deficit hyperactivity disorder)   . Anemia   . DVT (deep venous thrombosis) ([place])   . 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 Menometrorrhagia 01/02/2016    Assessment: 55 YOF presenting to ED with worsening leg pain and swelling s/p DVT diagnosis 8/9. Patient was on Xarelto PTA, with last dose 8/20 in the AM. Patient reports to the RN that she was taking the 15 mg BID until yesterday afternoon when she was asked to hold the medication in preparation for the procedure.   Patient went to vascular lab 8/21 for mechanical directed thrombolysis with IV tPA and heparin. Orders to continue heparin post procedure.  Heparin level is therapeutic at 0.50, however, it is likely falsely elevated from recent Xarelto dose. Pharmacy will follow aPTT, until correlating with heparin level. PTT is subtherapeutic at 43. CBC is stable and no new s/s bleeding noted.   Goal of Therapy:  Anti-Xa level 0.2-0.5  aPTT 66-84s Monitor platelets by anticoagulation protocol: Yes    Plan:  Increase heparin gtt to 900 units/hr Check heparin level/aPTT  in 6 hrs with AM labs Daily heparin level, aPTT, and CBC Monitor for s/s bleeding F/u long-term AC plan   ADDENDUM:  APTT remains subtherapeutic at 42 this morning with no infusion issues per RN. RN notes sheath site had oozing that required pressure and a dressing change.  RN reports bleeding has slowed and is not progressive after above intervention.  CBC remains stable. Increase heparin gtt conservatively to 1000 units/hr  Check aPTT/ heparin in 6 hrs  Daily heparin level, aPTT, and CBC  Monitor for additional s/s bleeding (RN to call if worsens)  Argie Ramming, PharmD Clinical Pharmacist 05/11/17 11:50 PM

## 2017-05-11 NOTE — Op Note (Signed)
Patient name: Rhett Najera MRN: [id number] DOB: [birth year] Sex: female  05/11/2017 Pre-operative Diagnosis: left leg DVT Post-operative diagnosis:  Same Surgeon:  Annamarie Major Procedure Performed:  1.  Ultrasound-guided access, left popliteal vein  2.  IVUS (intravascular ultrasound), left popliteal, femoral, common femoral, external iliac, common iliac vein, and inferior vena cava  3.   Mechanical thrombectomy with AngioJet of the left popliteal, femoral, common femoral, external iliac, common iliac vein  4.   Mechanical catheter directed thrombolysis with intravenous injection of TPA of the left popliteal, femoral, common femoral, external iliac, common iliac vein, and inferior vena cava  5.   Angioplasty of the left popliteal, femoral, common femoral, external iliac, and common iliac vein  6.   Left lower extremity venogram and inferior venacavogram  7.   Placement of thrombus lysis catheter for continuous overnight infusion of TPA  7.   Conscious sedation (15mnutes)   Indications:  The patient experienced a left leg DVT approximately 2 weeks ago and has significant swelling of her left leg.  Ultrasound identified thrombus within the iliac system.  She is here today for further evaluation and possible intervention.  Procedure:  The patient was identified in the holding area and taken to room 8.  The patient was then placed supine on the table and prepped and draped in the usual sterile fashion.  A time out was called.   Conscious sedation was administered with the use of IV fentanyl and Versed in a continuous position and nurse monitoring.  Heart rate, blood pressure, and oxygen saturation were continues to monitor. Ultrasound was used to evaluate the left popliteal vein.  This showed thrombus.  It was cannulated under ultrasound guidance with an 18-gauge needle.  A 035 Bentson wire was advanced into the left femoral vein.  The needle was removed and then I inserted a Berenstein 2  catheter.  I used this catheter to navigate into the inferior vena cava.  It did have some resistance in the left common iliac vein.  A contrast injection was performed at this level confirming cannulation of the inferior vena cava.  A Amplatz superstiff wire was then advanced and the catheter positioned in the subclavian vein.  The catheter was then removed and a 9 French sheath was placed.  The patient was given 5000 units of heparin.  I then selected a ivus catheter and performed intravascular ultrasound of the popliteal, femoral, common femoral, external iliac, common iliac vein and inferior vena cava.  This showed significant thrombus burden throughout the venous system with some chronic component.  There did appear to be significant luminal narrowing in the common iliac vein extending into the inferior vena cava.  Next, the [facility] catheter was used for mechanical thrombectomy of the popliteal, femoral, common femoral, external iliac, common iliac vein.  This was done up and back for a total of 45 seconds.  Next, 20 mg of TPA was mixed into a 500 cc saline bag and then I performed catheter directed thrombolysis of the left popliteal, femoral, common femoral, external iliac, common iliac vein, and inferior vena cava.  This was done up and back for a total of 2 minutes.  The TPA was then left there for 10 minutes.  I then used the AngioJet catheter again to perform mechanical thrombectomy of the left popliteal, femoral, common femoral, external iliac, and common iliac veins.  This was done up and back for a total of 90 seconds.  Total treatment time of the  AngioJet device was 201 seconds.  I then used the eye this catheter to reimage the left leg and there appeared to be significant luminal narrowing and residual thrombus.  I then selected a 8 x 80 Mustang balloon and perform balloon angioplasty of the left popliteal, femoral, common femoral, external iliac, and common iliac vein taking the balloon to  14 atm.  I then reinserted the intravascular ultrasound catheter there did appear to be a flow channel at this time, however I still felt there was residual thrombus.  There also appeared to be a chronic component.  I then performed a contrast injection.  This showed a in-line flow channel from the femoral-popliteal and iliac venous system into the inferior vena cava however somewhat small in caliber.  There did appear to be some residual thrombus.  At this point I elected to place a lysis catheter for overnight thrombolytic therapy.  The tip of the catheter was advanced through vena cava and the popliteal vein.  The patient will be brought back tomorrow for follow-up study   Impression:  #1  significant left leg DVT treated with mechanical thrombectomy and catheter directed thrombolytic therapy using the [facility] AngioJet device.  Balloon angioplasty of the iliofemoral venous system on the left was also down with 8 mm balloon.  After this treatment, there did appear to be residual thrombus however there was now in line flow through the venous system.  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 Annamarie Major, M.D. [facility] [place] Office: [phone] Pager:  [phone]

## 2017-05-11 NOTE — Progress Notes (Signed)
ANTICOAGULATION CONSULT NOTE - Initial Consult  Pharmacy Consult for heparin Indication: DVT  Allergies  Allergen Reactions  . Wellbutrin [Bupropion] Other (See Comments)    Temporary memory loss  . Latex Rash    Patient Measurements: Height: 5\' 2"  (157.5 cm) Weight: 126 lb (57.2 kg) IBW/kg (Calculated) : 50.1 Heparin Dosing Weight: 58.1  Vital Signs: Temp: 98.5 F (36.9 C) (08/21 1025) Temp Source: Oral (08/21 1025) BP: 145/90 (08/21 1625) Pulse Rate: 0 (08/21 1645)  Labs:  Recent Labs  05/11/17 1144  HGB 12.6  HCT 37.0  CREATININE 0.60    Estimated Creatinine Clearance: 68 mL/min (by C-G formula based on SCr of 0.6 mg/dL).   Medical History: Past Medical History:  Diagnosis Date  . ADHD (attention deficit hyperactivity disorder)   . Anemia   . DVT (deep venous thrombosis) ([place])   . 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 Menometrorrhagia 01/02/2016    Assessment: 23 YOF presenting to ED with worsening leg pain and swelling  s/p DVT diagnosis 8/9.   Patient went to vascular lab this afternoon for mechanical directed thrombolysis with IV tPA and heparin. Orders to continue heparin post procedure.  Goal of Therapy:  Anti-Xa level 0.2-0.5  Monitor platelets by anticoagulation protocol: Yes    Plan:  Continue heparin at 800 units/hr Recheck heparin level at 2200 then q6 hours  Erin Hearing PharmD., BCPS Clinical Pharmacist Pager [phone] 05/11/2017 5:25 PM

## 2017-05-11 NOTE — Progress Notes (Signed)
Blood drawn for labs from saline lock and tubed

## 2017-05-11 NOTE — Progress Notes (Signed)
Eating dinner. Waiting on 2H bed assignment

## 2017-05-11 NOTE — Progress Notes (Signed)
Resting quietly. States she doesn't need pain medication at this time.

## 2017-05-12 ENCOUNTER — Encounter (HOSPITAL_COMMUNITY): Payer: Self-pay | Admitting: *Deleted

## 2017-05-12 ENCOUNTER — Inpatient Hospital Stay (HOSPITAL_COMMUNITY)
Admission: AD | Disposition: A | Discharge status: Home / Self Care | Payer: Self-pay | Source: Ambulatory Visit | Attending: Surgery

## 2017-05-12 DIAGNOSIS — I82402 Acute embolism and thrombosis of unspecified deep veins of left lower extremity: Secondary | ICD-10-CM | POA: Diagnosis not present

## 2017-05-12 HISTORY — PX: PERIPHERAL VASCULAR INTERVENTION: CATH118257

## 2017-05-12 HISTORY — PX: INTRAVASCULAR ULTRASOUND/IVUS: CATH118244

## 2017-05-12 LAB — CBC
HCT: 35.6 % — ABNORMAL LOW (ref 36.0–46.0)
HCT: 35.7 % — ABNORMAL LOW (ref 36.0–46.0)
HEMOGLOBIN: 11.5 g/dL — AB (ref 12.0–15.0)
Hemoglobin: 11.1 g/dL — ABNORMAL LOW (ref 12.0–15.0)
MCH: 27.5 pg (ref 26.0–34.0)
MCH: 28.3 pg (ref 26.0–34.0)
MCHC: 31.2 g/dL (ref 30.0–36.0)
MCHC: 32.2 g/dL (ref 30.0–36.0)
MCV: 88.1 fL (ref 78.0–100.0)
MCV: 87.7 fL (ref 78.0–100.0)
PLATELETS: 457 10*3/uL — AB (ref 150–400)
PLATELETS: 471 10*3/uL — AB (ref 150–400)
RBC: 4.04 MIL/uL (ref 3.87–5.11)
RBC: 4.07 MIL/uL (ref 3.87–5.11)
RDW: 13.9 % (ref 11.5–15.5)
RDW: 13.9 % (ref 11.5–15.5)
WBC: 9.7 10*3/uL (ref 4.0–10.5)
WBC: 11.5 10*3/uL — ABNORMAL HIGH (ref 4.0–10.5)

## 2017-05-12 LAB — BASIC METABOLIC PANEL
Anion gap: 4 — ABNORMAL LOW (ref 5–15)
CALCIUM: 8.6 mg/dL — AB (ref 8.9–10.3)
CO2: 25 mmol/L (ref 22–32)
Chloride: 111 mmol/L (ref 101–111)
Creatinine, Ser: 0.6 mg/dL (ref 0.44–1.00)
GFR calc Af Amer: >60 mL/min (ref >60)
GFR calc non Af Amer: >60 mL/min (ref >60)
GLUCOSE: 86 mg/dL (ref 65–99)
Potassium: 3.8 mmol/L (ref 3.5–5.1)
Sodium: 140 mmol/L (ref 135–145)

## 2017-05-12 LAB — HEPARIN LEVEL (UNFRACTIONATED)
Heparin Unfractionated: 0.16 IU/mL — ABNORMAL LOW (ref 0.30–0.70)
Heparin Unfractionated: 0.42 IU/mL (ref 0.30–0.70)

## 2017-05-12 LAB — FIBRINOGEN
FIBRINOGEN: 279 mg/dL (ref 210–475)
Fibrinogen: 396 mg/dL (ref 210–475)

## 2017-05-12 LAB — APTT
aPTT: 42 seconds — ABNORMAL HIGH (ref 24–36)
aPTT: 63 seconds — ABNORMAL HIGH (ref 24–36)

## 2017-05-12 LAB — MRSA PCR SCREENING: MRSA by PCR: NEGATIVE

## 2017-05-12 SURGERY — PERIPHERAL VASCULAR INTERVENTION
Anesthesia: LOCAL

## 2017-05-12 MED ORDER — ACETAMINOPHEN 325 MG PO TABS: 650.0000 mg | ORAL_TABLET | ORAL | Status: DC | PRN

## 2017-05-12 MED ORDER — HEPARIN SODIUM (PORCINE) 1000 UNIT/ML IJ SOLN
INTRAMUSCULAR | Status: DC | PRN
Start: 2017-05-12 — End: 2017-05-12
  Administered 2017-05-12: 4000 [IU] via INTRAVENOUS

## 2017-05-12 MED ORDER — SODIUM CHLORIDE 0.9 % IV SOLN: 250.0000 mL | INTRAVENOUS | Status: DC | PRN

## 2017-05-12 MED ORDER — IODIXANOL 320 MG/ML IV SOLN
INTRAVENOUS | Status: DC | PRN
  Administered 2017-05-12: 35 mL via INTRAVENOUS

## 2017-05-12 MED ORDER — FENTANYL CITRATE (PF) 100 MCG/2ML IJ SOLN
INTRAMUSCULAR | Status: AC
  Filled 2017-05-12: qty 2

## 2017-05-12 MED ORDER — LABETALOL HCL 5 MG/ML IV SOLN: 10.0000 mg | INTRAVENOUS | Status: DC | PRN

## 2017-05-12 MED ORDER — HEPARIN (PORCINE) IN NACL 2-0.9 UNIT/ML-% IJ SOLN
INTRAMUSCULAR | Status: AC
  Filled 2017-05-12: qty 500

## 2017-05-12 MED ORDER — LIDOCAINE HCL (PF) 1 % IJ SOLN
INTRAMUSCULAR | Status: DC | PRN
  Administered 2017-05-12: 5 mL

## 2017-05-12 MED ORDER — SODIUM CHLORIDE 0.9% FLUSH: 3.0000 mL | INTRAVENOUS | Status: DC | PRN

## 2017-05-12 MED ORDER — HEPARIN (PORCINE) IN NACL 100-0.45 UNIT/ML-% IJ SOLN
1100.0000 [IU]/h | INTRAMUSCULAR | Status: DC
  Administered 2017-05-12: 1100 [IU]/h via INTRAVENOUS
  Filled 2017-05-12: qty 250

## 2017-05-12 MED ORDER — MIDAZOLAM HCL 2 MG/2ML IJ SOLN
INTRAMUSCULAR | Status: DC | PRN
  Administered 2017-05-12: 1 mg via INTRAVENOUS

## 2017-05-12 MED ORDER — ONDANSETRON HCL 4 MG/2ML IJ SOLN: 4.0000 mg | Freq: Four times a day (QID) | INTRAMUSCULAR | Status: DC | PRN

## 2017-05-12 MED ORDER — HYDRALAZINE HCL 20 MG/ML IJ SOLN: 5.0000 mg | INTRAMUSCULAR | Status: DC | PRN

## 2017-05-12 MED ORDER — MIDAZOLAM HCL 2 MG/2ML IJ SOLN
INTRAMUSCULAR | Status: AC
  Filled 2017-05-12: qty 2

## 2017-05-12 MED ORDER — HEPARIN SODIUM (PORCINE) 1000 UNIT/ML IJ SOLN
INTRAMUSCULAR | Status: AC
  Filled 2017-05-12: qty 1

## 2017-05-12 MED ORDER — ASPIRIN EC 81 MG PO TBEC
81.0000 mg | DELAYED_RELEASE_TABLET | Freq: Every day | ORAL | Status: DC
  Administered 2017-05-13: 81 mg via ORAL
  Filled 2017-05-12: qty 1

## 2017-05-12 MED ORDER — MORPHINE SULFATE (PF) 4 MG/ML IV SOLN: 2.0000 mg | INTRAVENOUS | Status: DC | PRN

## 2017-05-12 MED ORDER — OXYCODONE HCL 5 MG PO TABS
5.0000 mg | ORAL_TABLET | ORAL | Status: DC | PRN
  Administered 2017-05-12: 10 mg via ORAL
  Administered 2017-05-12 – 2017-05-13 (×2): 5 mg via ORAL
  Filled 2017-05-12: qty 2
  Filled 2017-05-12 (×2): qty 1

## 2017-05-12 MED ORDER — FENTANYL CITRATE (PF) 100 MCG/2ML IJ SOLN
INTRAMUSCULAR | Status: DC | PRN
  Administered 2017-05-12 (×3): 50 ug via INTRAVENOUS

## 2017-05-12 MED ORDER — SODIUM CHLORIDE 0.9 % IV SOLN: INTRAVENOUS | Status: AC

## 2017-05-12 MED ORDER — SODIUM CHLORIDE 0.9% FLUSH
3.0000 mL | Freq: Two times a day (BID) | INTRAVENOUS | Status: DC
  Administered 2017-05-12: via INTRAVENOUS

## 2017-05-12 MED FILL — Heparin Sodium (Porcine) 2 Unit/ML in Sodium Chloride 0.9%: INTRAMUSCULAR | Qty: 500 | Status: AC

## 2017-05-12 SURGICAL SUPPLY — 13 items
BALLN MUSTANG 12.0X40 75 (BALLOONS) ×3
BALLN MUSTANG 12X60X75 (BALLOONS) ×3
BALLOON MUSTANG 12.0X40 75 (BALLOONS) ×2 IMPLANT
BALLOON MUSTANG 12X60X75 (BALLOONS) ×2 IMPLANT
CATH VISIONS PV .035 IVUS (CATHETERS) ×3 IMPLANT
KIT ENCORE 26 ADVANTAGE (KITS) ×3 IMPLANT
KIT PV (KITS) ×3 IMPLANT
SHEATH PINNACLE 9F 10CM (SHEATH) ×3 IMPLANT
STENT WALLSTENT 14X90X75 (Permanent Stent) ×3 IMPLANT
STENT WALLSTENTÂ 16X90X75 (Permanent Stent) ×3 IMPLANT
TRANSDUCER W/STOPCOCK (MISCELLANEOUS) ×3 IMPLANT
TRAY PV CATH (CUSTOM PROCEDURE TRAY) ×3 IMPLANT
WIRE AMPLATZ SS-J .035X260CM (WIRE) ×3 IMPLANT

## 2017-05-12 NOTE — Progress Notes (Signed)
ANTICOAGULATION CONSULT NOTE  Pharmacy Consult:  Heparin Indication: DVT  Allergies  Allergen Reactions  . Wellbutrin [Bupropion] Other (See Comments)    Temporary memory loss  . Latex Rash    Patient Measurements: Height: 5\' 2"  (157.5 cm) Weight: 126 lb (57.2 kg) IBW/kg (Calculated) : 50.1 Heparin Dosing Weight: 58 kg  Vital Signs: Temp: 98.3 F (36.8 C) (08/22 1100) Temp Source: Oral (08/22 1100) BP: 141/90 (08/22 1400) Pulse Rate: 87 (08/22 1400)  Labs:  Recent Labs  05/11/17 1144  05/11/17 2219 05/11/17 2220 05/11/17 2233 05/12/17 0406 05/12/17 0522 05/12/17 1019 05/12/17 1256  HGB 12.6  < >  --  11.4*  --   --  11.1* 11.5*  --   HCT 37.0  < >  --  34.7*  --   --  35.6* 35.7*  --   PLT  --   < >  --  526*  --   --  457* 471*  --   APTT  --   --   --   --  43*  --  42*  --  63*  HEPARINUNFRC  --   < > 0.50  --   --  0.16*  --   --  0.42  CREATININE 0.60  --   --   --   --   --  0.60  --   --   < > = values in this interval not displayed.  Estimated Creatinine Clearance: 68 mL/min (by C-G formula based on SCr of 0.6 mg/dL).   Assessment: 76 yoF presenting to the ED with worsening leg pain and swelling s/p DVT diagnosis on 04/29/17. Patient was on Xarelto PTA, with last dose 8/20 in the AM. Patient reports to the RN that she was taking the 15 mg BID until yesterday afternoon when she was asked to hold the medication in preparation for the procedure. Patient went to vascular lab 8/21 for mechanical directed thrombolysis with IV tPA.  Alteplase and heparin were continued post procedure.  Patient is s/p OR again today for lytic catheter recheck.  She also had stenting of the left common iliac, external iliac and common femoral veins.  Pharmacy consulted to resume IV heparin 2 hours post sheath removal (sheath removed at 1640).  Alteplase was stopped earlier this afternoon and is currently off per RN.  RN also reported that patient is no longer oozing.   Goal of  Therapy:  Heparin level 0.2 - 0.5 units/mL aPTT 66-84s Monitor platelets by anticoagulation protocol: Yes     Plan:  At [place], resume heparin gtt at previous rate of 1100 units/hr, no bolus Check 6 hr heparin level and aPTT Monitor closely for s/sx of bleeding/hematoma   Syna Gad D. Mina Marble, PharmD, BCPS Pager:  [phone] 05/12/2017, 5:23 PM

## 2017-05-12 NOTE — Progress Notes (Addendum)
Left leg venous sheath oozing through original dressing.Pressure held for 20 min. Dressing reinforced. No hematoma noted. Am labs drawn and sent to lab. Will continue to monitor.

## 2017-05-12 NOTE — Op Note (Signed)
    Patient name: April Vega MRN: [id number] DOB: [birth year] Sex: female  05/12/2017 Pre-operative Diagnosis: dvt left lower extremity, May-Thurner syndrome Post-operative diagnosis:  Same Surgeon:  Eda Paschal. Donzetta Matters, MD Procedure Performed: 1.  Lytic catheter recheck 2.  IVUS of ivc, left common iliac, external iliac, common femoral, femoral and popliteal veins 3.  Left lower extremity venogram 4.  Stent of left common iliac, external iliac and common femoral veins with 16 x 35mm central and 14 x 41mm peripherally 5.  Moderate sedation with fentanyl and versed for 67 minutes   Indications:  46 year old female with a history of previous left lower extremity DVT that then recurred while on therapy. She was found to have clot extending up into her IVC for which she underwent procedure the day prior to this with pharmacal mechanical thrombectomy intravascular ultrasound and placement of lysis catheter. That time she was found to have [name] and is now return for lysis recheck with stenting of her left common iliac vein and possibly further.  Findings: The IVC was patent as previous. The left common iliac vein was subtotally occluded at the level of the crossed with the right common iliac artery. There was chronic appearing thrombosis of the external iliac vein on the left as well as the left common femoral vein. The femoral popliteal veins have a chronic appearing thrombosis but do have a flow lumen. At completion the area of subtotal occlusion now had an area of 96.4 mm and the external iliac vein which was narrowed had a stent area of 66.1 mm.. The diameter at that area was 9.3 mm.   Procedure:  The patient was identified in the holding area and taken to room 8.  The patient was then placed prone on the table sterilely prepped and draped in her left lower extremity existing catheter and sheath and timeout called. She was given fentanyl and Versed for sedation. We then exchanged the  catheter over Amplatz wire which was placed under fluoroscopy into her right innominate vein. The sheath was also exchanged and clean gloves were then used. IVUS was performed from the popliteal all the way to the inferior vena cava. We then elected to stent the area of subtotal occlusion using first a 16 x 90 stent. This was postdilated with a 12 mm balloon and we also used a 12 mm balloon into the common femoral artery where there was significant stenosis. We repeated IVUS and had continued stenosis of the left common femoral and left external iliac veins. We then performed venography which demonstrated significant collaterals there. We then extended our stents with a 14 x 90 mm stent. These 2 were postdilated with 12 mm balloon. We then we performed IVIS and recorded with the above noted sizes. Venograms demonstrated uninterrupted flow with no further collaterals from the popliteal all the way through the IVC. Satisfied with this we then removed our wire and catheter. Sheath was pulled and pressure held for 10 minutes until hemostasis obtained. Patient's left we'll be wrapped with Ace wraps tonight. We will restart heparin in 2 hours from sheath and she will be considered for restarting oral anticoagulants tomorrow. Patient tolerated procedure well without immediate complication.  Contrast: 35cc  Janyth Riera C. Donzetta Matters, MD [facility] [place] Office: [phone] Pager: [phone]

## 2017-05-12 NOTE — Progress Notes (Addendum)
[facility] [place]  Subjective  - Comfortable over all, no new complaints.   Objective 127/73 82 98.2 F (36.8 C) (Oral) 15 100%  Intake/Output Summary (Last 24 hours) at 05/12/17 0093 Last data filed at 05/12/17 0700  Gross per 24 hour  Intake          1510.74 ml  Output             2101 ml  Net          -590.26 ml    Left LE Placement of thrombus lysis catheter for continuous overnight infusion of TPA Left LE significant edema B LE DP 2+, sensation and active range of motion intact Lungs non labored breathing Heart RRR   Assessment/Planning: POD # 1  Procedure Performed:             1.  Ultrasound-guided access, left popliteal vein             2.  IVUS (intravascular ultrasound), left popliteal, femoral, common femoral, external iliac, common iliac vein, and inferior vena cava             3.   Mechanical thrombectomy with AngioJet of the left popliteal, femoral, common femoral, external iliac, common iliac vein             4.   Mechanical catheter directed thrombolysis with intravenous injection of TPA of the left popliteal, femoral, common femoral, external iliac, common iliac vein, and inferior vena cava             5.   Angioplasty of the left popliteal, femoral, common femoral, external iliac, and common iliac vein             6.   Left lower extremity venogram and inferior venacavogram             7.   Placement of thrombus lysis catheter for continuous overnight infusion of TPA             7.   Conscious sedation (6minutes)  Impression:             #1  significant left leg DVT treated with mechanical thrombectomy and catheter directed thrombolytic therapy using the [employer] AngioJet device.  Balloon angioplasty of the iliofemoral venous system on the left was also down with 8 mm balloon.  After this treatment, there did appear to be residual thrombus however there was now in line flow through the venous system.  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 Images reviewed from yesterday. Plan for repeat venogram and ivus with possibel  repeat angiojet and likely stenting.   Adarsh Mundorf C. Donzetta Matters, MD [facility] [place] Office: [phone] Pager: [phone]

## 2017-05-12 NOTE — Progress Notes (Addendum)
TPA and Heparin turned off per Dr. Donzetta Matters.

## 2017-05-12 NOTE — Progress Notes (Signed)
ANTICOAGULATION CONSULT NOTE - F/u Consult  Pharmacy Consult for heparin Indication: DVT  Allergies  Allergen Reactions  . Wellbutrin [Bupropion] Other (See Comments)    Temporary memory loss  . Latex Rash    Patient Measurements: Height: 5\' 2"  (157.5 cm) Weight: 126 lb (57.2 kg) IBW/kg (Calculated) : 50.1 Heparin Dosing Weight: 58.1  Vital Signs: Temp: 98.3 F (36.8 C) (08/22 1100) Temp Source: Oral (08/22 1100) BP: 122/85 (08/22 1100) Pulse Rate: 78 (08/22 1100)  Labs:  Recent Labs  05/11/17 1144  05/11/17 2219 05/11/17 2220 05/11/17 2233 05/12/17 0406 05/12/17 0522 05/12/17 1019 05/12/17 1256  HGB 12.6  < >  --  11.4*  --   --  11.1* 11.5*  --   HCT 37.0  < >  --  34.7*  --   --  35.6* 35.7*  --   PLT  --   < >  --  526*  --   --  457* 471*  --   APTT  --   --   --   --  43*  --  42*  --  63*  HEPARINUNFRC  --   < > 0.50  --   --  0.16*  --   --  0.42  CREATININE 0.60  --   --   --   --   --  0.60  --   --   < > = values in this interval not displayed.  Estimated Creatinine Clearance: 68 mL/min (by C-G formula based on SCr of 0.6 mg/dL).  Medical History: Past Medical History:  Diagnosis Date  . ADHD (attention deficit hyperactivity disorder)   . Anemia   . DVT (deep venous thrombosis) ([place])   . 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 Menometrorrhagia 01/02/2016    Assessment: 84 yoF presenting to ED with worsening leg pain and swelling s/p DVT diagnosis 8/9. Patient was on Xarelto PTA, with last dose 8/20 in the AM. Patient reports to the RN that she was taking the 15 mg BID until yesterday afternoon when she was asked to hold the medication in preparation for the procedure. Patient went to vascular lab 8/21 for mechanical directed thrombolysis with IV tPA and heparin. Orders to continue heparin post procedure.  Heparin level is therapeutic at 0.42, however, it is likely falsely elevated from recent Xarelto dose. Pharmacy will follow aPTT, until correlating  with heparin level. aPTT is slightly subtherapeutic at 63. CBC is stable, oozing from sheath site is stable. Pt to return to OR today or tomorrow per [facility].  Goal of Therapy:  Anti-Xa level 0.2-0.5  aPTT 66-84s Monitor platelets by anticoagulation protocol: Yes    Plan:  -Increase heparin to 1100 units/hr -Follow-up 6-hr aPTT -Monitor daily aPTT and heparin level until correlating -Follow-up anticoagulant plans post-OR  Arrie Senate, PharmD PGY-2 Cardiology Pharmacy Resident Pager: [phone] 05/12/2017

## 2017-05-13 ENCOUNTER — Encounter (HOSPITAL_COMMUNITY): Payer: Self-pay | Admitting: Vascular Surgery

## 2017-05-13 ENCOUNTER — Telehealth: Payer: Self-pay | Admitting: Vascular Surgery

## 2017-05-13 LAB — APTT: aPTT: 80 seconds — ABNORMAL HIGH (ref 24–36)

## 2017-05-13 LAB — BASIC METABOLIC PANEL
Anion gap: 6 (ref 5–15)
CHLORIDE: 104 mmol/L (ref 101–111)
CO2: 26 mmol/L (ref 22–32)
Calcium: 8.9 mg/dL (ref 8.9–10.3)
Creatinine, Ser: 0.5 mg/dL (ref 0.44–1.00)
GFR calc Af Amer: >60 mL/min (ref >60)
Glucose, Bld: 102 mg/dL — ABNORMAL HIGH (ref 65–99)
POTASSIUM: 3.3 mmol/L — AB (ref 3.5–5.1)
Sodium: 136 mmol/L (ref 135–145)

## 2017-05-13 LAB — CBC
HCT: 36.1 % (ref 36.0–46.0)
Hemoglobin: 11.7 g/dL — ABNORMAL LOW (ref 12.0–15.0)
MCH: 28.5 pg (ref 26.0–34.0)
MCHC: 32.4 g/dL (ref 30.0–36.0)
MCV: 88 fL (ref 78.0–100.0)
PLATELETS: 475 10*3/uL — AB (ref 150–400)
RBC: 4.1 MIL/uL (ref 3.87–5.11)
RDW: 14.1 % (ref 11.5–15.5)
WBC: 10.6 10*3/uL — ABNORMAL HIGH (ref 4.0–10.5)

## 2017-05-13 LAB — HEPARIN LEVEL (UNFRACTIONATED): Heparin Unfractionated: 0.47 IU/mL (ref 0.30–0.70)

## 2017-05-13 MED ORDER — OXYCODONE HCL 5 MG PO TABS: 5.0000 mg | ORAL_TABLET | ORAL | 0 refills | Status: DC | PRN

## 2017-05-13 MED ORDER — POTASSIUM CHLORIDE CRYS ER 20 MEQ PO TBCR
40.0000 meq | EXTENDED_RELEASE_TABLET | Freq: Once | ORAL | Status: AC
  Administered 2017-05-13: 40 meq via ORAL
  Filled 2017-05-13: qty 2

## 2017-05-13 MED ORDER — ASPIRIN 81 MG PO TBEC: 81.0000 mg | DELAYED_RELEASE_TABLET | Freq: Every day | ORAL | Status: DC

## 2017-05-13 MED ORDER — RIVAROXABAN 15 MG PO TABS
15.0000 mg | ORAL_TABLET | Freq: Two times a day (BID) | ORAL | Status: DC
  Administered 2017-05-13: 15 mg via ORAL
  Filled 2017-05-13 (×2): qty 1

## 2017-05-13 MED FILL — Heparin Sodium (Porcine) 2 Unit/ML in Sodium Chloride 0.9%: INTRAMUSCULAR | Qty: 500 | Status: AC
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
## 2017-05-13 NOTE — Discharge Instructions (Signed)
Information on my medicine - XARELTO (rivaroxaban)  This medication education was reviewed with me or my healthcare representative as part of my discharge preparation.  The pharmacist that spoke with me during my hospital stay was:  Einar Grad, [facility]  WHY WAS [place]? Xarelto was prescribed to treat blood clots that may have been found in the veins of your legs (deep vein thrombosis) or in your lungs (pulmonary embolism) and to reduce the risk of them occurring again.  What do you need to know about Xarelto? The starting dose is one 15 mg tablet taken TWICE daily with food for the FIRST 21 DAYS then on (enter date)  05/31/2017  the dose is changed to one 20 mg tablet taken ONCE A DAY with your evening meal.  DO NOT stop taking Xarelto without talking to the health care provider who prescribed the medication.  Refill your prescription for 20 mg tablets before you run out.  After discharge, you should have regular check-up appointments with your healthcare provider that is prescribing your Xarelto.  In the future your dose may need to be changed if your kidney function changes by a significant amount.  What do you do if you miss a dose? If you are taking Xarelto TWICE DAILY and you miss a dose, take it as soon as you remember. You may take two 15 mg tablets (total 30 mg) at the same time then resume your regularly scheduled 15 mg twice daily the next day.  If you are taking Xarelto ONCE DAILY and you miss a dose, take it as soon as you remember on the same day then continue your regularly scheduled once daily regimen the next day. Do not take two doses of Xarelto at the same time.   Important Safety Information Xarelto is a blood thinner medicine that can cause bleeding. You should call your healthcare provider right away if you experience any of the following: ? Bleeding from an injury or your nose that does not stop. ? Unusual colored urine (red or dark brown)  or unusual colored stools (red or black). ? Unusual bruising for unknown reasons. ? A serious fall or if you hit your head (even if there is no bleeding).  Some medicines may interact with Xarelto and might increase your risk of bleeding while on Xarelto. To help avoid this, consult your healthcare provider or pharmacist prior to using any new prescription or non-prescription medications, including herbals, vitamins, non-steroidal anti-inflammatory drugs (NSAIDs) and supplements.  This website has more information on Xarelto: [email or web address]

## 2017-05-13 NOTE — Progress Notes (Signed)
ANTICOAGULATION CONSULT NOTE  Pharmacy Consult:  Heparin Indication: DVT  Allergies  Allergen Reactions  . Wellbutrin [Bupropion] Other (See Comments)    Temporary memory loss  . Latex Rash    Patient Measurements: Height: 5\' 2"  (157.5 cm) Weight: 126 lb (57.2 kg) IBW/kg (Calculated) : 50.1 Heparin Dosing Weight: 58 kg  Vital Signs: Temp: 98.6 F (37 C) (08/23 0300) Temp Source: Oral (08/23 0300) BP: 111/70 (08/23 0300) Pulse Rate: 83 (08/23 0300)  Labs:  Recent Labs  05/11/17 1144  05/12/17 0406 05/12/17 0522 05/12/17 1019 05/12/17 1256 05/13/17 0228  HGB 12.6  < >  --  11.1* 11.5*  --  11.7*  HCT 37.0  < >  --  35.6* 35.7*  --  36.1  PLT  --   < >  --  457* 471*  --  475*  APTT  --   < >  --  42*  --  63* 80*  HEPARINUNFRC  --   < > 0.16*  --   --  0.42 0.47  CREATININE 0.60  --   --  0.60  --   --  0.50  < > = values in this interval not displayed.  Estimated Creatinine Clearance: 68 mL/min (by C-G formula based on SCr of 0.5 mg/dL).   Assessment: 56 yoF presenting to the ED with worsening leg pain and swelling s/p DVT diagnosis on 04/29/17. Patient was on Xarelto 15 mg BID PTA, with last dose 8/20 in the AM.  Patient went to vascular lab 8/21 for mechanical directed thrombolysis with IV tPA.  Alteplase and heparin were continued post procedure. Patient is s/p OR 8/22 for lytic catheter recheck, stenting of the left common iliac, external iliac and common femoral veins. IV Heparin was resume 2 hours post sheath removal.   Heparin level is 0.47, aPTT is 80 - both at goal.  H/H is stable. Platelets stable.  No bleeding noted- confirmed with RN.   Goal of Therapy:  Heparin level 0.2 - 0.5 units/mL aPTT 66-84s Monitor platelets by anticoagulation protocol: Yes     Plan:  Continue heparin gtt at 1100 units/hr Check 6 hr heparin level and aPTT to confirm (likely can stop checking aPTTs after this) Daily heparin levels.  Monitor closely for s/sx of  bleeding/hematoma   Sloan Leiter, PharmD, BCPS Clinical Pharmacist After hours, please call [phone] 05/13/2017, 4:16 AM

## 2017-05-13 NOTE — Telephone Encounter (Signed)
-----   Message from Mena Goes, RN sent at 05/13/2017  9:10 AM EDT ----- Regarding: 4 weeks w/ aortoiliac vein    ----- Message ----- From: Ansel Bong Sent: 05/13/2017   7:47 AM To: Vvs Charge Pool  S/p IVUS of ivc, left common iliac, external iliac, common femoral, femoral and popliteal veins,  stent of left common iliac, external iliac and common femoral veins with 16 x 76mm central and 14 x 68mm peripherally 05/12/17  F/u with Dr. Donzetta Matters in 4 weeks with iliocaval duplex  Thanks Maudie Mercury

## 2017-05-13 NOTE — Telephone Encounter (Signed)
Sched lab 06/08/17 at 8:00 and MD 06/11/17 at 2:30. Lm on hm#.

## 2017-05-13 NOTE — Progress Notes (Signed)
  Progress Note    05/13/2017 8:19 AM 1 Day Post-Op  Subjective:  No complaints, left leg still feels edematous  Vitals:   05/13/17 0600 05/13/17 0700  BP: 107/75 119/66  Pulse: 82 (!) 34  Resp: 12 14  Temp:    SpO2: 99% 100%    Physical Exam: aaox3 Non labored respirations Abdomen is soft Left popliteal fossa is soft LLE is soft with minimal edema  CBC    Component Value Date/Time   WBC 10.6 (H) 05/13/2017 0228   RBC 4.10 05/13/2017 0228   HGB 11.7 (L) 05/13/2017 0228   HGB 13.3 12/21/2016 0931   HCT 36.1 05/13/2017 0228   HCT 40.6 12/21/2016 0931   PLT 475 (H) 05/13/2017 0228   PLT 403 (H) 12/21/2016 0931   MCV 88.0 05/13/2017 0228   MCV 91 12/21/2016 0931   MCH 28.5 05/13/2017 0228   MCHC 32.4 05/13/2017 0228   RDW 14.1 05/13/2017 0228   RDW 13.7 12/21/2016 0931   LYMPHSABS 1.5 05/05/2017 1505   LYMPHSABS 1.5 12/21/2016 0931   MONOABS 0.8 05/05/2017 1505   EOSABS 0.3 05/05/2017 1505   EOSABS 0.5 12/21/2016 0931   BASOSABS 0.1 05/05/2017 1505   BASOSABS 0.1 12/21/2016 0931    BMET    Component Value Date/Time   NA 136 05/13/2017 0228   NA 140 01/02/2016 0928   K 3.3 (L) 05/13/2017 0228   K 3.8 01/02/2016 0928   CL 104 05/13/2017 0228   CO2 26 05/13/2017 0228   CO2 23 01/02/2016 0928   GLUCOSE 102 (H) 05/13/2017 0228   GLUCOSE 67 (L) 01/02/2016 0928   BUN <5 (L) 05/13/2017 0228   BUN 6.8 (L) 01/02/2016 0928   CREATININE 0.50 05/13/2017 0228   CREATININE 0.73 10/05/2016 1031   CREATININE 0.7 01/02/2016 0928   CALCIUM 8.9 05/13/2017 0228   CALCIUM 9.6 01/02/2016 0928   GFRNONAA >60 05/13/2017 0228   GFRNONAA >89 10/05/2016 1031   GFRAA >60 05/13/2017 0228   GFRAA >89 10/05/2016 1031    INR    Component Value Date/Time   INR 1.39 05/05/2017 1505     Intake/Output Summary (Last 24 hours) at 05/13/17 0819 Last data filed at 05/13/17 0500  Gross per 24 hour  Intake          1628.15 ml  Output             3075 ml  Net         -1446.85  ml     Assessment:  46 y.o. female is s/p lysis and subsequent stenting of left common iliac vein for [name]  Plan: [facility] for discharge today Discussed taking aspirin as well as resuming xarelto at home We will place ted hose here and she will need at least knee high compression at home, otc at first and we will get her fitted at first office visit F/u in 4 weeks with ivc/iliac vein duplex   Brandon C. Donzetta Matters, MD [facility] [place] Office: [phone] Pager: [phone]  05/13/2017 8:19 AM

## 2017-05-13 NOTE — Progress Notes (Signed)
Discharge instructions and Prescription given. Answered pt's questions and she verbalized understanding. No other questions at this time. IV's removed and pt disconnected from unit monitor. No s/s of distress. Awaiting pt's transportation home at this time.

## 2017-05-14 NOTE — H&P (Signed)
[facility] [place]  Patient name: April Vega      MRN: [id number]        DOB: 10/04/1970          Sex: female   REASON FOR VISIT:    DVT  HISOTRY OF PRESENT ILLNESS:    April Vega is a 46 y.o. female who presented on August 6 with swelling in her left leg.  This was initially felt to be musculoskeletal, however a ultrasound revealed a DVT.  She was initially started on Eliquis, however she did not improve and so she was changed over to [place].  Duplex ultrasound reveals occlusive thrombus from the common femoral vein to the popliteal vein.  The distal IVC is occluded as is the left common iliac and external iliac veins.  She denies any prior history of DVT or PE.  She has recently changed her oral contraceptive pill and July.  She denies any prolonged periods of inactivity.  There is no family history of clotting disorder.   PAST MEDICAL HISTORY:       Past Medical History:  Diagnosis Date  . ADHD (attention deficit hyperactivity disorder)   . Anemia   . DVT (deep venous thrombosis) ([place])   . 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 Menometrorrhagia 01/02/2016     FAMILY HISTORY:        Family History  Problem Relation Age of Onset  . Diabetes Mother   . Hypertension Mother     SOCIAL HISTORY:       Social History  Substance Use Topics  . Smoking status: Never Smoker  . Smokeless tobacco: Never Used  . Alcohol use No     ALLERGIES:        Allergies  Allergen Reactions  . Wellbutrin [Bupropion] Other (See Comments)    Temporary memory loss  . Latex Rash     CURRENT MEDICATIONS:            Current Facility-Administered Medications  Medication Dose Route Frequency Provider Last Rate Last Dose  . 0.9 %  sodium chloride infusion   Intravenous Continuous Serafina Mitchell, MD 100 mL/hr at 05/11/17 1147      REVIEW OF SYSTEMS:   [X]  denotes positive finding,  [ ]  denotes negative finding Cardiac  Comments:  Chest pain or chest pressure:    Shortness of breath upon exertion:    Short of breath when lying flat:    Irregular heart rhythm:        Vascular    Pain in calf, thigh, or hip brought on by ambulation:    Pain in feet at night that wakes you up from your sleep:     Blood clot in your veins:    Leg swelling:  x       Pulmonary    Oxygen at home:    Productive cough:     Wheezing:         Neurologic    Sudden weakness in arms or legs:     Sudden numbness in arms or legs:     Sudden onset of difficulty speaking or slurred speech:    Temporary loss of vision in one eye:     Problems with dizziness:         Gastrointestinal    Blood in stool:     Vomited blood:         Genitourinary    Burning when urinating:     Blood in urine:  Psychiatric    Major depression:         Hematologic    Bleeding problems:    Problems with blood clotting too easily:        Skin    Rashes or ulcers:        Constitutional    Fever or chills:      PHYSICAL EXAM:      Vitals:   05/11/17 1025  BP: 116/74  Pulse: 88  Temp: 98.5 F (36.9 C)  TempSrc: Oral  SpO2: 100%  Weight: 126 lb (57.2 kg)  Height: 5\' 2"  (1.575 m)    GENERAL: The patient is a well-nourished female, in no acute distress. The vital signs are documented above. CARDIAC: There is a regular rate and rhythm.  VASCULAR: Significant left leg swelling.  Palpable pedal pulses. PULMONARY: Non-labored respirations MUSCULOSKELETAL: There are no major deformities or cyanosis. NEUROLOGIC: No focal weakness or paresthesias are detected. SKIN: There are no ulcers or rashes noted. PSYCHIATRIC: The patient has a normal affect.  STUDIES:    Duplex ultrasound reveals occlusive thrombus from the common femoral vein to the popliteal vein.  The distal IVC is occluded as is  the left common iliac and external iliac veins  MEDICAL ISSUES:   Left leg DVT: The patient will be brought in for left lower extremity venography with possible mechanical thrombectomy and thrombosed lysis.  I did discuss the possibility of infusion overnight with TPA.  We did discuss the risks and benefits including the risk of bleeding.  She wishes to proceed.    Annamarie Major, MD [facility] [phone] Pager [phone]

## 2017-05-31 ENCOUNTER — Other Ambulatory Visit: Payer: Self-pay | Admitting: Physician Assistant

## 2017-06-01 ENCOUNTER — Other Ambulatory Visit: Payer: Self-pay

## 2017-06-01 ENCOUNTER — Telehealth: Payer: Self-pay | Admitting: *Deleted

## 2017-06-01 DIAGNOSIS — Z48812 Encounter for surgical aftercare following surgery on the circulatory system: Secondary | ICD-10-CM

## 2017-06-01 NOTE — Telephone Encounter (Signed)
Patient called asking for Rx for Xarelto.  Her sample pak will end Friday, 06/04/17.  I will speak with Dr Donzetta Matters 06/02/17.

## 2017-06-02 ENCOUNTER — Other Ambulatory Visit: Payer: Self-pay

## 2017-06-02 ENCOUNTER — Telehealth: Payer: Self-pay | Admitting: Vascular Surgery

## 2017-06-02 MED ORDER — RIVAROXABAN 20 MG PO TABS: 20.0000 mg | ORAL_TABLET | Freq: Every day | ORAL | 2 refills | Status: DC

## 2017-06-08 ENCOUNTER — Encounter: Payer: Self-pay | Admitting: Vascular Surgery

## 2017-06-08 ENCOUNTER — Ambulatory Visit (HOSPITAL_COMMUNITY)
Admit: 2017-06-08 | Discharge: 2017-06-08 | Disposition: A | Discharge status: Home / Self Care | Payer: Federal, State, Local not specified - PPO | Attending: Vascular Surgery | Admitting: Vascular Surgery

## 2017-06-08 ENCOUNTER — Ambulatory Visit (INDEPENDENT_AMBULATORY_CARE_PROVIDER_SITE_OTHER): Payer: Federal, State, Local not specified - PPO | Admitting: Vascular Surgery

## 2017-06-08 VITALS — BP 121/79 | HR 76 | Temp 97.7°F | Resp 16 | Ht 62.0 in | Wt 118.0 lb

## 2017-06-08 DIAGNOSIS — Z95828 Presence of other vascular implants and grafts: Secondary | ICD-10-CM | POA: Insufficient documentation

## 2017-06-08 DIAGNOSIS — I82421 Acute embolism and thrombosis of right iliac vein: Secondary | ICD-10-CM | POA: Insufficient documentation

## 2017-06-08 DIAGNOSIS — I82521 Chronic embolism and thrombosis of right iliac vein: Secondary | ICD-10-CM | POA: Insufficient documentation

## 2017-06-08 DIAGNOSIS — Z48812 Encounter for surgical aftercare following surgery on the circulatory system: Secondary | ICD-10-CM

## 2017-06-08 DIAGNOSIS — I82413 Acute embolism and thrombosis of femoral vein, bilateral: Secondary | ICD-10-CM | POA: Diagnosis not present

## 2017-06-08 NOTE — Progress Notes (Signed)
[facility] [place]  Patient name: Kyna Blahnik MRN: [id number] DOB: 1970/11/18 Sex: female  REASON FOR VISIT: Follow-up DVT  HPI: Sashia Campas is a 46 y.o. female is here today for vascular lab follow-up only. Was found to have some changes in his abdomen on my schedule for further discussion. 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 cane. She is here today for duplex follow-up and was to see Dr. Donzetta Matters later in the week. She mentioned to our ultrasonographer technician she had some pain in her right groin and on imaging was found to have right iliac DVT. In reviewing her prior ultrasound and the venograms there is not any prior imaging of her right venous system. He has had no swelling in her right leg and the discomfort has resolved  Past Medical History:  Diagnosis Date  . ADHD (attention deficit hyperactivity disorder)   . Anemia   . DVT (deep venous thrombosis) ([place])   . 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 Menometrorrhagia 01/02/2016    Family History  Problem Relation Age of Onset  . Diabetes Mother   . Hypertension Mother     SOCIAL HISTORY: Social History  Substance Use Topics  . Smoking status: Never Smoker  . Smokeless tobacco: Never Used  . Alcohol use No    Allergies  Allergen Reactions  . Wellbutrin [Bupropion] Other (See Comments)    Temporary memory loss  . Latex Rash    Current Outpatient Prescriptions  Medication Sig Dispense Refill  . acetaminophen (TYLENOL) 500 MG tablet Take 500 mg by mouth every 6 (six) hours as needed (for pain or headaches).     . AMBULATORY NON FORMULARY MEDICATION One walking cane to use for mobility due to DVT of left leg. 1 Device 0  . aspirin EC 81  MG EC tablet Take 1 tablet (81 mg total) by mouth daily.    . cyclobenzaprine (FLEXERIL) 10 MG tablet Take 1 tablet (10 mg total) by mouth 2 (two) times daily as needed. (Patient taking differently: Take 10 mg by mouth 2 (two) times daily as needed for muscle spasms. ) 20 tablet 0  . FLUoxetine (PROZAC) 20 MG tablet Take 1 tablet (20 mg total) by mouth daily. 30 tablet 1  . HYDROcodone-acetaminophen (NORCO/VICODIN) 5-325 MG tablet Take 1 tablet by mouth every 6 (six) hours as needed for pain.    . Iron-FA-B Cmp-C-Biot-Probiotic (FUSION PLUS) CAPS Take 1 capsule by mouth daily. 30 capsule 11  . Multiple Vitamins-Minerals (ALIVE ONCE DAILY WOMENS) TABS Take 1 tablet by mouth daily.    [name] oxyCODONE (OXY IR/ROXICODONE) 5 MG immediate release tablet Take 1 tablet (5 mg total) by mouth every 4 (four) hours as needed for moderate pain. 20 tablet 0  . rivaroxaban (XARELTO) 20 MG TABS tablet Take 1 tablet (20 mg total) by mouth daily with supper. 30 tablet 2  . traMADol (ULTRAM) 50 MG tablet Take 0.5-1 tablets (25-50 mg total) by mouth every 6 (six) hours as needed for severe pain. 30 tablet 0  . Vitamin D, Ergocalciferol, (DRISDOL) 50000 units CAPS capsule TAKE ONE CAPSULE BY MOUTH EVERY 7 DAYS (Patient taking differently: Take 50,000 units by mouth once a week) 8 capsule 3   No current facility-administered medications for this visit.  REVIEW OF SYSTEMS:  [X]  denotes positive finding, [ ]  denotes negative finding Cardiac  Comments:  Chest pain or chest pressure:    Shortness of breath upon exertion:    Short of breath when lying flat:    Irregular heart rhythm:        Vascular    Pain in calf, thigh, or hip brought on by ambulation:    Pain in feet at night that wakes you up from your sleep:     Blood clot in your veins:    Leg swelling:  x         PHYSICAL EXAM: Vitals:   06/08/17 0924  BP: 121/79  Pulse: 76  Resp: 16  Temp: 97.7 F (36.5 C)  SpO2: 100%  Weight: 118 lb (53.5 kg)    Height: 5\' 2"  (1.575 m)    GENERAL: The patient is a well-nourished female, in no acute distress. The vital signs are documented above. CARDIOVASCULAR: 2+ dorsalis pedis pulses bilaterally. No significant leg swelling on either right or left. Mild tenderness in her right groin. PULMONARY: There is good air exchange  MUSCULOSKELETAL: There are no major deformities or cyanosis. NEUROLOGIC: No focal weakness or paresthesias are detected. SKIN: There are no ulcers or rashes noted. PSYCHIATRIC: The patient has a normal affect.  DATA:  Duplex today shows wide patency of her left common iliac vein, iliac vein and common femoral vein and also widely patent vena cava. On the right she has occlusive thrombus in her common iliac and external iliac vein which has components of both chronic and acute thrombus.  MEDICAL ISSUES: Discussed this at length with the patient and also by telephone with Dr. Donzetta Matters. Will plan continued anticoagulation with Xarelto and aspirin as she is doing. She will notify should she develop any new swelling. Otherwise we'll see Dr. Donzetta Matters in our office in 3 months with bilateral duplex follow-up    Rosetta Posner, MD [facility] Tel [phone] Pager [phone]

## 2017-06-11 ENCOUNTER — Encounter: Payer: Federal, State, Local not specified - PPO | Admitting: Vascular Surgery

## 2017-06-14 NOTE — Addendum Note (Signed)
Addended by: Lianne Cure A on: 06/14/2017 11:02 AM   Modules accepted: Orders

## 2017-06-26 ENCOUNTER — Other Ambulatory Visit: Payer: Self-pay | Admitting: Family

## 2017-06-26 DIAGNOSIS — D509 Iron deficiency anemia, unspecified: Secondary | ICD-10-CM

## 2017-06-26 DIAGNOSIS — N921 Excessive and frequent menstruation with irregular cycle: Secondary | ICD-10-CM

## 2017-06-26 DIAGNOSIS — E559 Vitamin D deficiency, unspecified: Secondary | ICD-10-CM

## 2017-06-30 ENCOUNTER — Other Ambulatory Visit: Payer: Self-pay | Admitting: *Deleted

## 2017-06-30 DIAGNOSIS — R4589 Other symptoms and signs involving emotional state: Secondary | ICD-10-CM

## 2017-06-30 DIAGNOSIS — F329 Major depressive disorder, single episode, unspecified: Principal | ICD-10-CM

## 2017-06-30 MED ORDER — FLUOXETINE HCL 20 MG PO TABS: 20.0000 mg | ORAL_TABLET | Freq: Every day | ORAL | 0 refills | Status: DC

## 2017-07-07 ENCOUNTER — Encounter: Payer: Self-pay | Admitting: Physician Assistant

## 2017-08-17 ENCOUNTER — Other Ambulatory Visit: Payer: Self-pay | Admitting: *Deleted

## 2017-08-17 DIAGNOSIS — K909 Intestinal malabsorption, unspecified: Secondary | ICD-10-CM

## 2017-08-17 MED ORDER — FUSION PLUS PO CAPS: 1.0000 | ORAL_CAPSULE | Freq: Every day | ORAL | 3 refills | Status: DC

## 2017-08-25 ENCOUNTER — Other Ambulatory Visit: Payer: Self-pay | Admitting: *Deleted

## 2017-08-25 DIAGNOSIS — R4589 Other symptoms and signs involving emotional state: Secondary | ICD-10-CM

## 2017-08-25 DIAGNOSIS — F329 Major depressive disorder, single episode, unspecified: Principal | ICD-10-CM

## 2017-08-25 MED ORDER — FLUOXETINE HCL 20 MG PO TABS: 20.0000 mg | ORAL_TABLET | Freq: Every day | ORAL | 0 refills | Status: DC

## 2017-08-31 ENCOUNTER — Encounter: Payer: Self-pay | Admitting: Physician Assistant

## 2017-08-31 ENCOUNTER — Ambulatory Visit: Payer: Federal, State, Local not specified - PPO | Admitting: Physician Assistant

## 2017-08-31 VITALS — BP 122/75 | HR 89 | Ht 62.0 in | Wt 127.0 lb

## 2017-08-31 DIAGNOSIS — Z131 Encounter for screening for diabetes mellitus: Secondary | ICD-10-CM | POA: Diagnosis not present

## 2017-08-31 DIAGNOSIS — R4589 Other symptoms and signs involving emotional state: Secondary | ICD-10-CM

## 2017-08-31 DIAGNOSIS — Z13220 Encounter for screening for lipoid disorders: Secondary | ICD-10-CM

## 2017-08-31 DIAGNOSIS — E559 Vitamin D deficiency, unspecified: Secondary | ICD-10-CM | POA: Diagnosis not present

## 2017-08-31 DIAGNOSIS — D508 Other iron deficiency anemias: Secondary | ICD-10-CM

## 2017-08-31 DIAGNOSIS — S39012A Strain of muscle, fascia and tendon of lower back, initial encounter: Secondary | ICD-10-CM | POA: Diagnosis not present

## 2017-08-31 DIAGNOSIS — N921 Excessive and frequent menstruation with irregular cycle: Secondary | ICD-10-CM | POA: Diagnosis not present

## 2017-08-31 DIAGNOSIS — F329 Major depressive disorder, single episode, unspecified: Secondary | ICD-10-CM

## 2017-08-31 MED ORDER — VITAMIN D (ERGOCALCIFEROL) 1.25 MG (50000 UNIT) PO CAPS: ORAL_CAPSULE | ORAL | 1 refills | Status: DC

## 2017-08-31 MED ORDER — CYCLOBENZAPRINE HCL 10 MG PO TABS: 10.0000 mg | ORAL_TABLET | Freq: Two times a day (BID) | ORAL | 1 refills | Status: DC | PRN

## 2017-08-31 MED ORDER — HYDROCODONE-ACETAMINOPHEN 5-325 MG PO TABS: 1.0000 | ORAL_TABLET | Freq: Three times a day (TID) | ORAL | 0 refills | Status: DC | PRN

## 2017-08-31 MED ORDER — FLUOXETINE HCL 40 MG PO CAPS: 40.0000 mg | ORAL_CAPSULE | Freq: Every day | ORAL | 1 refills | Status: DC
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
## 2017-08-31 NOTE — Patient Instructions (Signed)
Low Back Strain Rehab  Ask your health care provider which exercises are safe for you. Do exercises exactly as told by your health care provider and adjust them as directed. It is normal to feel mild stretching, pulling, tightness, or discomfort as you do these exercises, but you should stop right away if you feel sudden pain or your pain gets worse. Do not begin these exercises until told by your health care provider.  Stretching and range of motion exercises  These exercises warm up your muscles and joints and improve the movement and flexibility of your back. These exercises also help to relieve pain, numbness, and tingling.  Exercise A: Single knee to chest    1. Lie on your back on a firm surface with both legs straight.  2. Bend one of your knees. Use your hands to move your knee up toward your chest until you feel a gentle stretch in your lower back and buttock.  ? Hold your leg in this position by holding onto the front of your knee.  ? Keep your other leg as straight as possible.  3. Hold for __________ seconds.  4. Slowly return to the starting position.  5. Repeat with your other leg.  Repeat __________ times. Complete this exercise __________ times a day.  Exercise B: Prone extension on elbows    1. Lie on your abdomen on a firm surface.  2. Prop yourself up on your elbows.  3. Use your arms to help lift your chest up until you feel a gentle stretch in your abdomen and your lower back.  ? This will place some of your body weight on your elbows. If this is uncomfortable, try stacking pillows under your chest.  ? Your hips should stay down, against the surface that you are lying on. Keep your hip and back muscles relaxed.  4. Hold for __________ seconds.  5. Slowly relax your upper body and return to the starting position.  Repeat __________ times. Complete this exercise __________ times a day.  Strengthening exercises  These exercises build strength and endurance in your back. Endurance is the ability to  use your muscles for a long time, even after they get tired.  Exercise C: Pelvic tilt  1. Lie on your back on a firm surface. Bend your knees and keep your feet flat.  2. Tense your abdominal muscles. Tip your pelvis up toward the ceiling and flatten your lower back into the floor.  ? To help with this exercise, you may place a small towel under your lower back and try to push your back into the towel.  3. Hold for __________ seconds.  4. Let your muscles relax completely before you repeat this exercise.  Repeat __________ times. Complete this exercise __________ times a day.  Exercise D: Alternating arm and leg raises    1. Get on your hands and knees on a firm surface. If you are on a hard floor, you may want to use padding to cushion your knees, such as an exercise mat.  2. Line up your arms and legs. Your hands should be below your shoulders, and your knees should be below your hips.  3. Lift your left leg behind you. At the same time, raise your right arm and straighten it in front of you.  ? Do not lift your leg higher than your hip.  ? Do not lift your arm higher than your shoulder.  ? Keep your abdominal and back muscles tight.  ?   Keep your hips facing the ground.  ? Do not arch your back.  ? Keep your balance carefully, and do not hold your breath.  4. Hold for __________ seconds.  5. Slowly return to the starting position and repeat with your right leg and your left arm.  Repeat __________ times. Complete this exercise __________times a day.  Exercise J: Single leg lower with bent knees  1. Lie on your back on a firm surface.  2. Tense your abdominal muscles and lift your feet off the floor, one foot at a time, so your knees and hips are bent in an "L" shape (at about 90 degrees).  ? Your knees should be over your hips and your lower legs should be parallel to the floor.  3. Keeping your abdominal muscles tense and your knee bent, slowly lower one of your legs so your toe touches the ground.  4. Lift your  leg back up to return to the starting position.  ? Do not hold your breath.  ? Do not let your back arch. Keep your back flat against the ground.  5. Repeat with your other leg.  Repeat __________ times. Complete this exercise __________ times a day.  Posture and body mechanics    Body mechanics refers to the movements and positions of your body while you do your daily activities. Posture is part of body mechanics. Good posture and healthy body mechanics can help to relieve stress in your body's tissues and joints. Good posture means that your spine is in its natural S-curve position (your spine is neutral), your shoulders are pulled back slightly, and your head is not tipped forward. The following are general guidelines for applying improved posture and body mechanics to your everyday activities.  Standing     When standing, keep your spine neutral and your feet about hip-width apart. Keep a slight bend in your knees. Your ears, shoulders, and hips should line up.   When you do a task in which you stand in one place for a long time, place one foot up on a stable object that is 2-4 inches (5-10 cm) high, such as a footstool. This helps keep your spine neutral.  Sitting     When sitting, keep your spine neutral and keep your feet flat on the floor. Use a footrest, if necessary, and keep your thighs parallel to the floor. Avoid rounding your shoulders, and avoid tilting your head forward.   When working at a desk or a computer, keep your desk at a height where your hands are slightly lower than your elbows. Slide your chair under your desk so you are close enough to maintain good posture.   When working at a computer, place your monitor at a height where you are looking straight ahead and you do not have to tilt your head forward or downward to look at the screen.  Resting     When lying down and resting, avoid positions that are most painful for you.   If you have pain with activities such as sitting, bending,  stooping, or squatting (flexion-based activities), lie in a position in which your body does not bend very much. For example, avoid curling up on your side with your arms and knees near your chest (fetal position).   If you have pain with activities such as standing for a long time or reaching with your arms (extension-based activities), lie with your spine in a neutral position and bend your knees slightly. Try the   following positions:  ? Lying on your side with a pillow between your knees.  ? Lying on your back with a pillow under your knees.  Lifting     When lifting objects, keep your feet at least shoulder-width apart and tighten your abdominal muscles.   Bend your knees and hips and keep your spine neutral. It is important to lift using the strength of your legs, not your back. Do not lock your knees straight out.   Always ask for help to lift heavy or awkward objects.  This information is not intended to replace advice given to you by your health care provider. Make sure you discuss any questions you have with your health care provider.  Document Released: 09/07/2005 Document Revised: 05/14/2016 Document Reviewed: 06/19/2015  [employer]vier Interactive Patient Education  2018 [employer]

## 2017-09-01 DIAGNOSIS — D509 Iron deficiency anemia, unspecified: Secondary | ICD-10-CM | POA: Diagnosis not present

## 2017-09-01 DIAGNOSIS — E559 Vitamin D deficiency, unspecified: Secondary | ICD-10-CM | POA: Diagnosis not present

## 2017-09-01 DIAGNOSIS — Z131 Encounter for screening for diabetes mellitus: Secondary | ICD-10-CM | POA: Diagnosis not present

## 2017-09-01 DIAGNOSIS — N921 Excessive and frequent menstruation with irregular cycle: Secondary | ICD-10-CM | POA: Diagnosis not present

## 2017-09-01 DIAGNOSIS — Z13220 Encounter for screening for lipoid disorders: Secondary | ICD-10-CM | POA: Diagnosis not present

## 2017-09-03 ENCOUNTER — Other Ambulatory Visit: Payer: Self-pay

## 2017-09-03 MED ORDER — RIVAROXABAN 20 MG PO TABS: 20.0000 mg | ORAL_TABLET | Freq: Every day | ORAL | 2 refills | Status: DC

## 2017-09-04 LAB — CBC
HCT: 36.2 % (ref 35.0–45.0)
HEMOGLOBIN: 12.1 g/dL (ref 11.7–15.5)
MCH: 28.4 pg (ref 27.0–33.0)
MCHC: 33.4 g/dL (ref 32.0–36.0)
MCV: 85 fL (ref 80.0–100.0)
MPV: 9.5 fL (ref 7.5–12.5)
PLATELETS: 480 10*3/uL — AB (ref 140–400)
RBC: 4.26 10*6/uL (ref 3.80–5.10)
RDW: 12.5 % (ref 11.0–15.0)
WBC: 4.6 10*3/uL (ref 3.8–10.8)

## 2017-09-04 LAB — COMPLETE METABOLIC PANEL WITHOUT GFR
AG Ratio: 1.5 (calc) (ref 1.0–2.5)
ALT: 13 U/L (ref 6–29)
AST: 16 U/L (ref 10–35)
Albumin: 4.1 g/dL (ref 3.6–5.1)
Alkaline phosphatase (APISO): 41 U/L (ref 33–115)
BUN: 7 mg/dL (ref 7–25)
CO2: 26 mmol/L (ref 20–32)
Calcium: 9.3 mg/dL (ref 8.6–10.2)
Chloride: 106 mmol/L (ref 98–110)
Creat: 0.69 mg/dL (ref 0.50–1.10)
GFR, Est African American: 119 mL/min/1.73m2 (ref >=60)
GFR, Est Non African American: 103 mL/min/1.73m2 (ref >=60)
Globulin: 2.7 g/dL (ref 1.9–3.7)
Glucose, Bld: 84 mg/dL (ref 65–99)
Potassium: 3.9 mmol/L (ref 3.5–5.3)
Sodium: 138 mmol/L (ref 135–146)
Total Bilirubin: 0.3 mg/dL (ref 0.2–1.2)
Total Protein: 6.8 g/dL (ref 6.1–8.1)

## 2017-09-04 LAB — VITAMIN D 1,25 DIHYDROXY
VITAMIN D2 1, 25 (OH): 27 pg/mL
Vitamin D 1, 25 (OH)2 Total: 51 pg/mL (ref 18–72)
Vitamin D3 1, 25 (OH)2: 24 pg/mL

## 2017-09-04 LAB — IRON,TIBC AND FERRITIN PANEL
%SAT: 12 % (ref 11–50)
FERRITIN: 27 ng/mL (ref 10–232)
Iron: 30 ug/dL — ABNORMAL LOW (ref 40–190)
TIBC: 256 mcg/dL (calc) (ref 250–450)

## 2017-09-04 LAB — LIPID PANEL W/REFLEX DIRECT LDL
CHOLESTEROL: 195 mg/dL (ref <200)
HDL: 93 mg/dL (ref >50)
LDL CHOLESTEROL (CALC): 89 mg/dL
Non-HDL Cholesterol (Calc): 102 mg/dL (calc) (ref <130)
TRIGLYCERIDES: 45 mg/dL (ref <150)
Total CHOL/HDL Ratio: 2.1 (calc) (ref <5.0)

## 2017-09-04 LAB — VITAMIN B12: VITAMIN B 12: 618 pg/mL (ref 200–1100)

## 2017-09-04 LAB — TSH: TSH: 0.48 m[IU]/L

## 2017-09-04 LAB — VITAMIN B6: VITAMIN B6: 77.3 ng/mL — AB (ref 2.1–21.7)

## 2017-09-05 NOTE — Progress Notes (Signed)
Call pt: vitamin D looks good.  B6 pretty well out of normal range and high. How much b6 are you taking? I would cut in half.

## 2017-09-10 ENCOUNTER — Ambulatory Visit (INDEPENDENT_AMBULATORY_CARE_PROVIDER_SITE_OTHER): Payer: Federal, State, Local not specified - PPO | Admitting: Vascular Surgery

## 2017-09-10 ENCOUNTER — Encounter: Payer: Self-pay | Admitting: Vascular Surgery

## 2017-09-10 ENCOUNTER — Encounter (HOSPITAL_COMMUNITY): Payer: Federal, State, Local not specified - PPO

## 2017-09-10 ENCOUNTER — Ambulatory Visit: Payer: Federal, State, Local not specified - PPO | Admitting: Vascular Surgery

## 2017-09-10 ENCOUNTER — Ambulatory Visit (HOSPITAL_COMMUNITY)
Admission: RE | Admit: 2017-09-10 | Discharge: 2017-09-10 | Disposition: A | Discharge status: Home / Self Care | Payer: Federal, State, Local not specified - PPO | Source: Ambulatory Visit | Attending: Vascular Surgery | Admitting: Vascular Surgery

## 2017-09-10 ENCOUNTER — Other Ambulatory Visit: Payer: Self-pay

## 2017-09-10 VITALS — BP 126/90 | HR 103 | Temp 99.3°F | Resp 14 | Ht 64.0 in | Wt 127.0 lb

## 2017-09-10 DIAGNOSIS — I82413 Acute embolism and thrombosis of femoral vein, bilateral: Secondary | ICD-10-CM | POA: Diagnosis not present

## 2017-09-10 DIAGNOSIS — Z86718 Personal history of other venous thrombosis and embolism: Secondary | ICD-10-CM | POA: Insufficient documentation

## 2017-09-10 DIAGNOSIS — M7989 Other specified soft tissue disorders: Secondary | ICD-10-CM | POA: Insufficient documentation

## 2017-09-10 DIAGNOSIS — Z9889 Other specified postprocedural states: Secondary | ICD-10-CM | POA: Insufficient documentation

## 2017-09-10 DIAGNOSIS — M79606 Pain in leg, unspecified: Secondary | ICD-10-CM | POA: Insufficient documentation

## 2017-09-10 NOTE — Progress Notes (Signed)
Patient ID: April Vega, female   DOB: August 09, 1971, 46 y.o.   MRN: [id number]  Reason for Consult: Follow-up   Referred by Donella Stade, PA-C  Subjective:     HPI:  April Vega is a 46 y.o. female with a history of extensive left lower extremity DVT and underwent stenting of her left common, external, common femoral veins in August 2018.  She now remains on Xarelto and aspirin.  She has no new swelling she has no pain she has no itching and she is essentially back to normal.  At last visit she did have the findings of right-sided occluded common and external iliac veins but did not have any symptoms from this these were not pursued.  She has no issues related to today's visit.  This was her first episode of DVT as well.  Past Medical History:  Diagnosis Date  . ADHD (attention deficit hyperactivity disorder)   . Anemia   . DVT (deep venous thrombosis) ([place])   . 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 Menometrorrhagia 01/02/2016   Family History  Problem Relation Age of Onset  . Diabetes Mother   . Hypertension Mother    Past Surgical History:  Procedure Laterality Date  . bone graph  01/1990  . CESAREAN SECTION    . INTRAVASCULAR ULTRASOUND/IVUS Left 05/12/2017   Procedure: Intravascular Ultrasound/IVUS;  Surgeon: Waynetta Sandy, MD;  Location: [place] [facility];  Service: Cardiovascular;  Laterality: Left;  IVC TO LT POPLITEAL VEIN  . LOWER EXTREMITY VENOGRAPHY Bilateral 05/11/2017   Procedure: Bilateral Lower Extremity Venography;  Surgeon: Serafina Mitchell, MD;  Location: [place] [facility];  Service: Cardiovascular;  Laterality: Bilateral;  . PERIPHERAL VASCULAR INTERVENTION Left 05/12/2017   Procedure: PERIPHERAL VASCULAR INTERVENTION;  Surgeon: Waynetta Sandy, MD;  Location: [place] [facility] [facility];  Service: Cardiovascular;  Laterality: Left;  IVC TO LT COMMON FEM VEIN  STENT  . PERIPHERAL VASCULAR THROMBECTOMY Left 05/11/2017   Procedure: PERIPHERAL  VASCULAR THROMBECTOMY;  Surgeon: Serafina Mitchell, MD;  Location: [place] [facility];  Service: Cardiovascular;  Laterality: Left;  left lower extremity venous    Short Social History:  Social History   Tobacco Use  . Smoking status: Never Smoker  . Smokeless tobacco: Never Used  Substance Use Topics  . Alcohol use: No    Alcohol/week: 0.0 oz    Allergies  Allergen Reactions  . Wellbutrin [Bupropion] Other (See Comments)    Temporary memory loss  . Latex Rash    Current Outpatient Medications  Medication Sig Dispense Refill  . aspirin EC 81 MG EC tablet Take 1 tablet (81 mg total) by mouth daily.    [name] FLUoxetine (PROZAC) 40 MG capsule Take 1 capsule (40 mg total) by mouth daily. 30 capsule 1  . Iron-FA-B Cmp-C-Biot-Probiotic (FUSION PLUS) CAPS Take 1 capsule by mouth daily. 90 capsule 3  . Multiple Vitamins-Minerals (ALIVE ONCE DAILY WOMENS) TABS Take 1 tablet by mouth daily.    . rivaroxaban (XARELTO) 20 MG TABS tablet Take 1 tablet (20 mg total) by mouth daily with supper. 90 tablet 2  . Vitamin D, Ergocalciferol, (DRISDOL) 50000 units CAPS capsule TAKE ONE CAPSULE BY MOUTH EVERY 7 DAYS 12 capsule 1  . AMBULATORY NON FORMULARY MEDICATION One walking cane to use for mobility due to DVT of left leg. (Patient not taking: Reported on 09/10/2017) 1 Device 0  . cyclobenzaprine (FLEXERIL) 10 MG tablet Take 1 tablet (10 mg total) by mouth 2 (two) times  daily as needed. (Patient not taking: Reported on 09/10/2017) 20 tablet 1  . HYDROcodone-acetaminophen (NORCO/VICODIN) 5-325 MG tablet Take 1 tablet by mouth every 8 (eight) hours as needed for moderate pain. (Patient not taking: Reported on 09/10/2017) 15 tablet 0   No current facility-administered medications for this visit.     Review of Systems  Constitutional:  Constitutional negative. HENT: HENT negative.  Eyes: Eyes negative.  Respiratory: Respiratory negative.  Cardiovascular: Cardiovascular negative.  Musculoskeletal:  Musculoskeletal negative.  Skin: Skin negative.  Neurological: Neurological negative. Hematologic: Hematologic/lymphatic negative.  Psychiatric: Psychiatric negative.        Objective:  Objective   Vitals:   09/10/17 0907  BP: 126/90  Pulse: (!) 103  Resp: 14  Temp: 99.3 F (37.4 C)  TempSrc: Oral  SpO2: 97%  Weight: 127 lb (57.6 kg)  Height: 5\' 4"  (1.626 m)   Body mass index is 21.8 kg/m.  Physical Exam  Constitutional: She is oriented to person, place, and time. She appears well-developed.  HENT:  Head: Normocephalic.  Eyes: Pupils are equal, round, and reactive to light.  Neck: Normal range of motion.  Cardiovascular: Normal rate.  Abdominal: Soft. She exhibits no mass.  Musculoskeletal: She exhibits no edema or tenderness.  Previous area of surgery on left anterior leg  Neurological: She is alert and oriented to person, place, and time.  Skin: Skin is warm and dry.  Psychiatric: She has a normal mood and affect. Her behavior is normal. Thought content normal.    Data: I have independently interpreted her lower extremity venous duplex which demonstrates no deep or superficial thrombosis in her left common femoral stent is patent.     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  On follow-up she was found to have occluded right sided veins and this may be from jailing her right sided veins with the stent.  However she has remained asymptomatic on the right side and she has no symptoms on the left side with essentially no post-thrombotic syndrome.  She remains on Xarelto and aspirin.  She intermittently wears her compression and I have urged her to wear it more often.  She does walk and I have congratulated her for this.  We will have her follow-up in 6 months or so with repeat IVC iliac duplex.  At that time we can consider stopping  Xarelto given that we know the reason for the extensive lower extremity DVT.  Should she have issues sooner we are certainly happy to see her before at the six-month period.     Waynetta Sandy MD [facility]

## 2017-09-13 NOTE — Addendum Note (Signed)
Addended by: Lianne Cure A on: 09/13/2017 09:44 AM   Modules accepted: Orders

## 2017-09-21 HISTORY — PX: ENDOMETRIAL ABLATION W/ NOVASURE: SUR434

## 2017-10-14 ENCOUNTER — Ambulatory Visit: Payer: Federal, State, Local not specified - PPO | Admitting: Obstetrics & Gynecology

## 2017-11-12 DIAGNOSIS — Z1231 Encounter for screening mammogram for malignant neoplasm of breast: Secondary | ICD-10-CM | POA: Diagnosis not present

## 2017-11-12 LAB — HM MAMMOGRAPHY

## 2017-11-14 ENCOUNTER — Other Ambulatory Visit: Payer: Self-pay | Admitting: Physician Assistant

## 2017-12-01 ENCOUNTER — Encounter: Payer: Self-pay | Admitting: Physician Assistant

## 2017-12-06 DIAGNOSIS — R922 Inconclusive mammogram: Secondary | ICD-10-CM | POA: Diagnosis not present

## 2017-12-06 DIAGNOSIS — R928 Other abnormal and inconclusive findings on diagnostic imaging of breast: Secondary | ICD-10-CM | POA: Diagnosis not present

## 2017-12-06 DIAGNOSIS — N6002 Solitary cyst of left breast: Secondary | ICD-10-CM | POA: Diagnosis not present

## 2017-12-06 LAB — HM MAMMOGRAPHY

## 2017-12-15 ENCOUNTER — Other Ambulatory Visit: Payer: Self-pay | Admitting: Physician Assistant

## 2017-12-21 ENCOUNTER — Other Ambulatory Visit: Payer: Self-pay | Admitting: Physician Assistant

## 2017-12-21 DIAGNOSIS — R928 Other abnormal and inconclusive findings on diagnostic imaging of breast: Secondary | ICD-10-CM

## 2017-12-30 ENCOUNTER — Other Ambulatory Visit: Payer: Self-pay | Admitting: Physician Assistant

## 2017-12-31 ENCOUNTER — Other Ambulatory Visit: Payer: Self-pay | Admitting: Family

## 2017-12-31 DIAGNOSIS — D500 Iron deficiency anemia secondary to blood loss (chronic): Secondary | ICD-10-CM

## 2017-12-31 DIAGNOSIS — D510 Vitamin B12 deficiency anemia due to intrinsic factor deficiency: Secondary | ICD-10-CM

## 2018-01-03 ENCOUNTER — Ambulatory Visit (INDEPENDENT_AMBULATORY_CARE_PROVIDER_SITE_OTHER): Payer: Federal, State, Local not specified - PPO | Admitting: Sports Medicine

## 2018-01-03 ENCOUNTER — Ambulatory Visit: Payer: Federal, State, Local not specified - PPO | Admitting: Obstetrics & Gynecology

## 2018-01-03 ENCOUNTER — Encounter: Payer: Self-pay | Admitting: Sports Medicine

## 2018-01-03 ENCOUNTER — Encounter: Payer: Self-pay | Admitting: Family

## 2018-01-03 ENCOUNTER — Inpatient Hospital Stay: Payer: Federal, State, Local not specified - PPO | Attending: Hematology & Oncology

## 2018-01-03 ENCOUNTER — Inpatient Hospital Stay (HOSPITAL_BASED_OUTPATIENT_CLINIC_OR_DEPARTMENT_OTHER): Payer: Federal, State, Local not specified - PPO | Admitting: Family

## 2018-01-03 ENCOUNTER — Encounter: Payer: Self-pay | Admitting: Obstetrics & Gynecology

## 2018-01-03 ENCOUNTER — Other Ambulatory Visit: Payer: Self-pay

## 2018-01-03 VITALS — BP 122/72 | HR 82 | Ht 62.0 in | Wt 138.0 lb

## 2018-01-03 VITALS — BP 118/68 | HR 88 | Temp 98.2°F | Resp 16 | Wt 138.0 lb

## 2018-01-03 DIAGNOSIS — N938 Other specified abnormal uterine and vaginal bleeding: Secondary | ICD-10-CM

## 2018-01-03 DIAGNOSIS — D510 Vitamin B12 deficiency anemia due to intrinsic factor deficiency: Secondary | ICD-10-CM

## 2018-01-03 DIAGNOSIS — I82432 Acute embolism and thrombosis of left popliteal vein: Secondary | ICD-10-CM

## 2018-01-03 DIAGNOSIS — I82412 Acute embolism and thrombosis of left femoral vein: Secondary | ICD-10-CM

## 2018-01-03 DIAGNOSIS — R29898 Other symptoms and signs involving the musculoskeletal system: Secondary | ICD-10-CM | POA: Diagnosis not present

## 2018-01-03 DIAGNOSIS — N921 Excessive and frequent menstruation with irregular cycle: Secondary | ICD-10-CM | POA: Insufficient documentation

## 2018-01-03 DIAGNOSIS — D500 Iron deficiency anemia secondary to blood loss (chronic): Secondary | ICD-10-CM | POA: Diagnosis not present

## 2018-01-03 DIAGNOSIS — Z7982 Long term (current) use of aspirin: Secondary | ICD-10-CM | POA: Insufficient documentation

## 2018-01-03 LAB — CMP (CANCER CENTER ONLY)
ALT: 14 U/L (ref 0–55)
AST: 16 U/L (ref 5–34)
Albumin: 4 g/dL (ref 3.5–5.0)
Alkaline Phosphatase: 54 U/L (ref 40–150)
Anion gap: 7 (ref 3–11)
BUN: 9 mg/dL (ref 7–26)
CHLORIDE: 108 mmol/L (ref 98–109)
CO2: 25 mmol/L (ref 22–29)
Calcium: 9.8 mg/dL (ref 8.4–10.4)
Creatinine: 0.81 mg/dL (ref 0.60–1.10)
Glucose, Bld: 75 mg/dL (ref 70–140)
POTASSIUM: 4.3 mmol/L (ref 3.5–5.1)
Sodium: 140 mmol/L (ref 136–145)
Total Bilirubin: <0.2 mg/dL — ABNORMAL LOW (ref 0.2–1.2)
Total Protein: 7.7 g/dL (ref 6.4–8.3)

## 2018-01-03 LAB — CBC WITH DIFFERENTIAL (CANCER CENTER ONLY)
Basophils Absolute: 0.1 10*3/uL (ref 0.0–0.1)
Basophils Relative: 3 %
Eosinophils Absolute: 0.4 10*3/uL (ref 0.0–0.5)
Eosinophils Relative: 7 %
HCT: 34.3 % — ABNORMAL LOW (ref 34.8–46.6)
HEMOGLOBIN: 11 g/dL — AB (ref 11.6–15.9)
LYMPHS PCT: 30 %
Lymphs Abs: 1.5 10*3/uL (ref 0.9–3.3)
MCH: 25.5 pg — AB (ref 26.0–34.0)
MCHC: 32.1 g/dL (ref 32.0–36.0)
MCV: 79.4 fL — AB (ref 81.0–101.0)
Monocytes Absolute: 0.7 10*3/uL (ref 0.1–0.9)
Monocytes Relative: 13 %
NEUTROS PCT: 47 %
Neutro Abs: 2.4 10*3/uL (ref 1.5–6.5)
Platelet Count: 548 10*3/uL — ABNORMAL HIGH (ref 145–400)
RBC: 4.32 MIL/uL (ref 3.70–5.32)
RDW: 15.5 % (ref 11.1–15.7)
WBC: 5.1 10*3/uL (ref 3.9–10.0)

## 2018-01-03 LAB — IRON AND TIBC
Iron: 20 ug/dL — ABNORMAL LOW (ref 41–142)
SATURATION RATIOS: 6 % — AB (ref 21–57)
TIBC: 318 ug/dL (ref 236–444)
UIBC: 298 ug/dL

## 2018-01-03 LAB — FERRITIN: FERRITIN: 14 ng/mL (ref 9–269)

## 2018-01-03 LAB — VITAMIN B12: Vitamin B-12: 675 pg/mL (ref 180–914)

## 2018-01-03 NOTE — Progress Notes (Signed)
Patient ID: April Vega, female   DOB: 1971-03-30, 47 y.o.   MRN: [id number]  Chief Complaint  Patient presents with  . Menstrual Problem    HPI April Vega is a 47 y.o. female, married P28 (62 and 80 year old son) HPI She is here with continued long and irregular bleeding. She basically has 2 periods per month for quite a few months this past year.  I treated her with OCPs in the past but she then developed a DVT. This issue has been going on for more than a year. Nothing besides the OCPs makes it better or worse. She uses condoms for contraception.  Past Medical History:  Diagnosis Date  . ADHD (attention deficit hyperactivity disorder)   . Anemia   . DVT (deep venous thrombosis) ([place])   . 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 Menometrorrhagia 01/02/2016    Past Surgical History:  Procedure Laterality Date  . bone graph  01/1990  . CESAREAN SECTION    . INTRAVASCULAR ULTRASOUND/IVUS Left 05/12/2017   Procedure: Intravascular Ultrasound/IVUS;  Surgeon: Waynetta Sandy, MD;  Location: [place] [facility];  Service: Cardiovascular;  Laterality: Left;  IVC TO LT POPLITEAL VEIN  . LOWER EXTREMITY VENOGRAPHY Bilateral 05/11/2017   Procedure: Bilateral Lower Extremity Venography;  Surgeon: Serafina Mitchell, MD;  Location: [place] [facility];  Service: Cardiovascular;  Laterality: Bilateral;  . PERIPHERAL VASCULAR INTERVENTION Left 05/12/2017   Procedure: PERIPHERAL VASCULAR INTERVENTION;  Surgeon: Waynetta Sandy, MD;  Location: [place] [facility];  Service: Cardiovascular;  Laterality: Left;  IVC TO LT COMMON FEM VEIN  STENT  . PERIPHERAL VASCULAR THROMBECTOMY Left 05/11/2017   Procedure: PERIPHERAL VASCULAR THROMBECTOMY;  Surgeon: Serafina Mitchell, MD;  Location: [place] [facility];  Service: Cardiovascular;  Laterality: Left;  left lower extremity venous    Family History  Problem Relation Age of Onset  . Diabetes Mother   . Hypertension Mother     Social  History Social History   Tobacco Use  . Smoking status: Never Smoker  . Smokeless tobacco: Never Used  Substance Use Topics  . Alcohol use: No    Alcohol/week: 0.0 oz  . Drug use: No    Allergies  Allergen Reactions  . Wellbutrin [Bupropion] Other (See Comments)    Temporary memory loss  . Latex Rash    Current Outpatient Medications  Medication Sig Dispense Refill  . aspirin EC 81 MG EC tablet Take 1 tablet (81 mg total) by mouth daily.    . Iron-FA-B Cmp-C-Biot-Probiotic (FUSION PLUS) CAPS Take 1 capsule by mouth daily. 90 capsule 3  . rivaroxaban (XARELTO) 20 MG TABS tablet Take 1 tablet (20 mg total) by mouth daily with supper. 90 tablet 2  . AMBULATORY NON FORMULARY MEDICATION One walking cane to use for mobility due to DVT of left leg. (Patient not taking: Reported on 09/10/2017) 1 Device 0  . FLUoxetine (PROZAC) 40 MG capsule TAKE 1 CAPSULE (40 MG TOTAL) BY MOUTH DAILY. NEED APPOINTMENT FOR FUTURE REFILLS. (Patient not taking: Reported on 01/03/2018) 15 capsule 0  . montelukast (SINGULAIR) 10 MG tablet TAKE 1 TABLET (10 MG TOTAL) BY MOUTH AT BEDTIME. (Patient not taking: Reported on 01/03/2018) 90 tablet 0  . Vitamin D, Ergocalciferol, (DRISDOL) 50000 units CAPS capsule TAKE ONE CAPSULE BY MOUTH EVERY 7 DAYS (Patient not taking: Reported on 01/03/2018) 12 capsule 1   No current facility-administered medications for this visit.     Review of Systems Review of Systems  Blood  pressure 122/72, pulse 82, height 5\' 2"  (1.575 m), weight 138 lb (62.6 kg), last menstrual period 12/06/2017.  Physical Exam Physical Exam Breathing, conversing, and ambulating normally Well nourished, well hydrated Black female, no apparent distress Cervix appears normal Bimanual exam reveals a lumpy upper limit of normal mobile uterus   Data Reviewed Component 83mo ago  Adequacy Satisfactory for evaluation endocervical/transformation zone component PRESENT.   Diagnosis NEGATIVE FOR  INTRAEPITHELIAL LESIONS OR MALIGNANCY.   HPV NOT DETECTED   Comment: Normal Reference Range - NOT Detected  Material Submitted CervicoVaginal Pap [ThinPrep Imaged]   Resulting Agency [facility]      Specimen Collected: 03/22/17 00:00 Last Resulted: 03/25/17 00:00       Assessment    DUB    Plan    Check CBC, TSH, gyn u/s       April Vega 01/03/2018, 1:56 PM

## 2018-01-03 NOTE — Progress Notes (Signed)
Hematology and Oncology Follow Up Visit  April Vega [id number] 1970/11/27 47 y.o. 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 She states that in July of last year she developed an extensive DVT in the left lower extremity that is felt to be possibly due to the birth control she was on at the time. She has no prior history of thrombus. She had surgery with vascular to remove this and have a stent placed in the left femoral vein. [place] in December showed her thrombus had resolved. She is currently on 1 baby aspirin and Xarelto 20 mg PO daily.  She has been taking an oral iron supplement off and on. Her Hgb is 11.0 and platelet count is 548.  Her cycle is heavy and irregular occurring twice a month. She sees gynecologist later today and PCP tomorrow.  She follows up again with vascular in June.  No fever, chills, n/v, cough, rash, dizziness, SOB, chest pain, palpitations, abdominal pain or changes in bowel or bladder habits.  No swelling, tenderness, numbness or tingling in her extremities. No c/o pain.  She has maintained a good appetite and is staying well hydrated. Her weight is stable.   ECOG Performance Status: 1 - Symptomatic but completely ambulatory  Medications:  Allergies as of 01/03/2018      Reactions   Wellbutrin [bupropion] Other (See Comments)   Temporary memory loss   Latex Rash      Medication List        Accurate as of 01/03/18 11:01 AM. Always use your most recent med list.          ALIVE ONCE DAILY WOMENS Tabs Take 1 tablet by mouth daily.   AMBULATORY NON FORMULARY MEDICATION One walking cane to use for mobility due to DVT of left leg.   aspirin 81 MG EC tablet Take 1 tablet (81 mg total) by mouth daily.   cyclobenzaprine 10 MG tablet Commonly known as:  FLEXERIL Take 1  tablet (10 mg total) by mouth 2 (two) times daily as needed.   FLUoxetine 40 MG capsule Commonly known as:  PROZAC TAKE 1 CAPSULE (40 MG TOTAL) BY MOUTH DAILY. NEED APPOINTMENT FOR FUTURE REFILLS.   FUSION PLUS Caps Take 1 capsule by mouth daily.   HYDROcodone-acetaminophen 5-325 MG tablet Commonly known as:  NORCO/VICODIN Take 1 tablet by mouth every 8 (eight) hours as needed for moderate pain.   montelukast 10 MG tablet Commonly known as:  SINGULAIR TAKE 1 TABLET (10 MG TOTAL) BY MOUTH AT BEDTIME.   rivaroxaban 20 MG Tabs tablet Commonly known as:  XARELTO Take 1 tablet (20 mg total) by mouth daily with supper.   Vitamin D (Ergocalciferol) 50000 units Caps capsule Commonly known as:  DRISDOL TAKE ONE CAPSULE BY MOUTH EVERY 7 DAYS       Allergies:  Allergies  Allergen Reactions  . Wellbutrin [Bupropion] Other (See Comments)    Temporary memory loss  . Latex Rash    Past Medical History, Surgical history, Social history, and Family History were reviewed and updated.  Review of Systems: All other 10 point review of systems is negative.   Physical Exam:  vitals were not taken for this visit.   Wt Readings from Last 3 Encounters:  09/10/17 127 lb (57.6 kg)  08/31/17 127 lb (57.6 kg)  06/08/17 118 lb (53.5 kg)    Ocular: Sclerae unicteric, pupils equal, round and reactive to light Ear-nose-throat: Oropharynx clear, dentition fair Lymphatic: No cervical, supraclavicular or axillary adenopathy Lungs no rales or rhonchi, good excursion bilaterally Heart regular rate and rhythm, no murmur appreciated Abd soft, nontender, positive bowel sounds, no liver or spleen tip palpated on exam, no fluid wave  MSK no focal spinal tenderness, no joint edema Neuro: non-focal, well-oriented, appropriate affect Breasts: Deferred   Lab Results  Component Value Date   WBC 5.1 01/03/2018   HGB 12.1 09/01/2017   HCT 34.3 (L) 01/03/2018   MCV 79.4 (L) 01/03/2018   PLT 548 (H)  01/03/2018   Lab Results  Component Value Date   FERRITIN 27 09/01/2017   IRON 30 (L) 09/01/2017   TIBC 256 09/01/2017   UIBC 167 12/21/2016   IRONPCTSAT 12 09/01/2017   Lab Results  Component Value Date   RBC 4.32 01/03/2018   No results found for: KPAFRELGTCHN, LAMBDASER, KAPLAMBRATIO No results found for: IGGSERUM, IGA, IGMSERUM No results found for: Odetta Pink, SPEI   Chemistry      Component Value Date/Time   NA 138 09/01/2017 1140   NA 140 01/02/2016 0928   K 3.9 09/01/2017 1140   K 3.8 01/02/2016 0928   CL 106 09/01/2017 1140   CO2 26 09/01/2017 1140   CO2 23 01/02/2016 0928   BUN 7 09/01/2017 1140   BUN 6.8 (L) 01/02/2016 0928   CREATININE 0.69 09/01/2017 1140   CREATININE 0.7 01/02/2016 0928      Component Value Date/Time   CALCIUM 9.3 09/01/2017 1140   CALCIUM 9.6 01/02/2016 0928   ALKPHOS 49 05/05/2017 1505   ALKPHOS 39 (L) 01/02/2016 0928   AST 16 09/01/2017 1140   AST 14 01/02/2016 0928   ALT 13 09/01/2017 1140   ALT 10 01/02/2016 0928   BILITOT 0.3 09/01/2017 1140   BILITOT 0.32 01/02/2016 0928      Impression and Plan: April Vega is a very pleasant 47 yo African American female with history of iron deficiency anemia secondary to heavy cycles. She had to stop her birth control after developing a thrombus in the left leg last summer. She is doing well on aspirin and Xarelto now but is having a heavy irregular cycle.  We will see what her iron studies show and bring her back in for infusion if needed.  We will plan to see her back in another 2 months for follow-up.  She will contact our office with any questions or concerns. We can certainly see her sooner if need be.   Laverna Peace, NP 4/15/201911:01 AM

## 2018-01-03 NOTE — Progress Notes (Signed)
PT states she has her period every 2 weeks for 10 days at a time

## 2018-01-03 NOTE — Progress Notes (Signed)
    Patient was fitted for a : standard, cushioned, semi-rigid orthotic. The orthotic was heated and afterward the patient stood on the orthotic blank positioned on the orthotic stand. The patient was positioned in subtalar neutral position and 10 degrees of ankle dorsiflexion in a weight bearing stance. After completion of molding, a stable base was applied to the orthotic blank. The blank was ground to a stable position for weight bearing. Size: 8 Base: [employer] and safety inspector and Padding: Additional felt pad under the left orthotic The patient ambulated these, and they were very comfortable.  I spent 40 minutes with this patient, greater than 50% was face-to-face time counseling regarding the below diagnosis.  ___________________________________________ Gwen Her. Dianah Field, M.D., ABFM., CAQSM. Primary Care and [place] In[employer] of [place] [facility]

## 2018-01-03 NOTE — Assessment & Plan Note (Signed)
It sounds as though this was a provoked DVT, she started birth control just several weeks before she developed her DVT. She was somewhat hesitant to continue her runs.   I have encouraged her to exercise, and that exercise is theoretically preventative. Benefit certainly far outweigh the risks.
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
## 2018-01-04 ENCOUNTER — Ambulatory Visit (INDEPENDENT_AMBULATORY_CARE_PROVIDER_SITE_OTHER): Payer: Federal, State, Local not specified - PPO | Admitting: Physician Assistant

## 2018-01-04 ENCOUNTER — Ambulatory Visit (INDEPENDENT_AMBULATORY_CARE_PROVIDER_SITE_OTHER): Payer: Federal, State, Local not specified - PPO

## 2018-01-04 ENCOUNTER — Other Ambulatory Visit: Payer: Self-pay | Admitting: Obstetrics & Gynecology

## 2018-01-04 ENCOUNTER — Other Ambulatory Visit: Payer: Self-pay | Admitting: Family

## 2018-01-04 ENCOUNTER — Inpatient Hospital Stay: Payer: Federal, State, Local not specified - PPO

## 2018-01-04 ENCOUNTER — Encounter: Payer: Self-pay | Admitting: Physician Assistant

## 2018-01-04 VITALS — BP 104/88 | HR 92 | Temp 98.0°F | Resp 16

## 2018-01-04 VITALS — BP 116/80 | HR 80 | Ht 62.0 in | Wt 137.0 lb

## 2018-01-04 DIAGNOSIS — I82412 Acute embolism and thrombosis of left femoral vein: Secondary | ICD-10-CM | POA: Diagnosis not present

## 2018-01-04 DIAGNOSIS — K909 Intestinal malabsorption, unspecified: Secondary | ICD-10-CM | POA: Diagnosis not present

## 2018-01-04 DIAGNOSIS — F329 Major depressive disorder, single episode, unspecified: Secondary | ICD-10-CM

## 2018-01-04 DIAGNOSIS — F411 Generalized anxiety disorder: Secondary | ICD-10-CM | POA: Diagnosis not present

## 2018-01-04 DIAGNOSIS — I82432 Acute embolism and thrombosis of left popliteal vein: Secondary | ICD-10-CM

## 2018-01-04 DIAGNOSIS — Z7982 Long term (current) use of aspirin: Secondary | ICD-10-CM | POA: Diagnosis not present

## 2018-01-04 DIAGNOSIS — D259 Leiomyoma of uterus, unspecified: Secondary | ICD-10-CM

## 2018-01-04 DIAGNOSIS — R4589 Other symptoms and signs involving emotional state: Secondary | ICD-10-CM

## 2018-01-04 DIAGNOSIS — D500 Iron deficiency anemia secondary to blood loss (chronic): Secondary | ICD-10-CM | POA: Diagnosis not present

## 2018-01-04 DIAGNOSIS — N938 Other specified abnormal uterine and vaginal bleeding: Secondary | ICD-10-CM

## 2018-01-04 DIAGNOSIS — N921 Excessive and frequent menstruation with irregular cycle: Secondary | ICD-10-CM | POA: Diagnosis not present

## 2018-01-04 LAB — CBC
HCT: 34.2 % — ABNORMAL LOW (ref 35.0–45.0)
Hemoglobin: 10.9 g/dL — ABNORMAL LOW (ref 11.7–15.5)
MCH: 25 pg — AB (ref 27.0–33.0)
MCHC: 31.9 g/dL — ABNORMAL LOW (ref 32.0–36.0)
MCV: 78.4 fL — ABNORMAL LOW (ref 80.0–100.0)
MPV: 10.3 fL (ref 7.5–12.5)
PLATELETS: 591 10*3/uL — AB (ref 140–400)
RBC: 4.36 10*6/uL (ref 3.80–5.10)
RDW: 14.1 % (ref 11.0–15.0)
WBC: 7.7 10*3/uL (ref 3.8–10.8)

## 2018-01-04 LAB — TSH: TSH: 0.95 m[IU]/L

## 2018-01-04 MED ORDER — BUSPIRONE HCL 7.5 MG PO TABS: 7.5000 mg | ORAL_TABLET | Freq: Two times a day (BID) | ORAL | 1 refills | Status: DC

## 2018-01-04 MED ORDER — ALPRAZOLAM 0.5 MG PO TABS: 0.5000 mg | ORAL_TABLET | Freq: Every evening | ORAL | 1 refills | Status: DC | PRN

## 2018-01-04 MED ORDER — SODIUM CHLORIDE 0.9 % IV SOLN
510.0000 mg | Freq: Once | INTRAVENOUS | Status: AC
  Administered 2018-01-04: 510 mg via INTRAVENOUS
  Filled 2018-01-04: qty 17

## 2018-01-04 MED ORDER — FLUOXETINE HCL 20 MG PO TABS: 60.0000 mg | ORAL_TABLET | Freq: Every day | ORAL | 0 refills | Status: DC

## 2018-01-04 MED ORDER — SODIUM CHLORIDE 0.9 % IV SOLN
Freq: Once | INTRAVENOUS | Status: AC
  Administered 2018-01-04: 14:00:00 via INTRAVENOUS
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
## 2018-01-05 ENCOUNTER — Encounter: Payer: Self-pay | Admitting: Sports Medicine

## 2018-01-05 ENCOUNTER — Encounter: Payer: Self-pay | Admitting: Physician Assistant

## 2018-01-05 ENCOUNTER — Ambulatory Visit (INDEPENDENT_AMBULATORY_CARE_PROVIDER_SITE_OTHER): Payer: Federal, State, Local not specified - PPO | Admitting: Sports Medicine

## 2018-01-05 DIAGNOSIS — M79605 Pain in left leg: Secondary | ICD-10-CM

## 2018-01-05 LAB — VITAMIN B6: VITAMIN B6: 129.1 ug/L — AB (ref 2.0–32.8)

## 2018-01-05 NOTE — Progress Notes (Signed)
    Patient was fitted for a : standard, cushioned, semi-rigid orthotic. The orthotic was heated and afterward the patient stood on the orthotic blank positioned on the orthotic stand. The patient was positioned in subtalar neutral position and 10 degrees of ankle dorsiflexion in a weight bearing stance. After completion of molding, a stable base was applied to the orthotic blank. The blank was ground to a stable position for weight bearing. Size: 8 Base: [employer] and safety inspector and [place]: Felt lift under the left orthotic The patient ambulated these, and they were very comfortable.  I spent 40 minutes with this patient, greater than 50% was face-to-face time counseling regarding the below diagnosis.  ___________________________________________ Gwen Her. Dianah Field, M.D., ABFM., CAQSM. Primary Care and [place] Instructor of [place] of [facility] of Medicine

## 2018-01-05 NOTE — Assessment & Plan Note (Signed)
New set of custom orthotics as above, heel lift on the left.

## 2018-01-11 ENCOUNTER — Encounter: Payer: Self-pay | Admitting: Physician Assistant

## 2018-01-17 ENCOUNTER — Ambulatory Visit: Payer: Federal, State, Local not specified - PPO | Admitting: Obstetrics & Gynecology

## 2018-01-17 ENCOUNTER — Encounter: Payer: Self-pay | Admitting: Obstetrics & Gynecology

## 2018-01-17 VITALS — BP 108/73 | HR 90 | Ht 62.0 in | Wt 139.0 lb

## 2018-01-17 DIAGNOSIS — Z01812 Encounter for preprocedural laboratory examination: Secondary | ICD-10-CM

## 2018-01-17 DIAGNOSIS — D219 Benign neoplasm of connective and other soft tissue, unspecified: Secondary | ICD-10-CM

## 2018-01-17 DIAGNOSIS — N938 Other specified abnormal uterine and vaginal bleeding: Secondary | ICD-10-CM

## 2018-01-17 DIAGNOSIS — Z3202 Encounter for pregnancy test, result negative: Secondary | ICD-10-CM | POA: Diagnosis not present

## 2018-01-17 DIAGNOSIS — Z30430 Encounter for insertion of intrauterine contraceptive device: Secondary | ICD-10-CM | POA: Diagnosis not present

## 2018-01-17 DIAGNOSIS — D500 Iron deficiency anemia secondary to blood loss (chronic): Secondary | ICD-10-CM

## 2018-01-17 LAB — POCT URINE PREGNANCY: Preg Test, Ur: NEGATIVE

## 2018-01-17 MED ORDER — LEVONORGESTREL 20 MCG/24HR IU IUD
INTRAUTERINE_SYSTEM | Freq: Once | INTRAUTERINE | Status: AC
  Administered 2018-01-17: 15:00:00 via INTRAUTERINE

## 2018-01-17 NOTE — Addendum Note (Signed)
Addended by: Lyndal Rainbow on: 01/17/2018 02:56 PM   Modules accepted: Orders

## 2018-01-17 NOTE — Progress Notes (Signed)
   Subjective:    Patient ID: April Vega, female    DOB: [birth year], 47 y.o.   MRN: [id number]  HPI 47 yo married P2 here to discuss her u/s results. This was done for evaluation of DUB. She did well with OCPs but developed a DVT. Her u/s showed fibroids. We discussed a hysterectomy, endometrial ablation (she has had one c/s), and IUD. She opts for an IUD. She is having her period today.   Review of Systems She uses withdrawal and condoms for contraception.    Objective:   Physical Exam Breathing, conversing, and ambulating normally Well nourished, well hydrated Black female, no apparent distress  UPT negative, consent signed, Time out procedure done. Cervix prepped with betadine and grasped with a single tooth tenaculum. Mirena was easily placed and the strings were cut to 3-4 cm. Uterus sounded to 10 cm. She tolerated the procedure well.      Assessment & Plan:  DUB, anemia, fibroids- trial of Mirena Come back 4 weeks

## 2018-02-02 ENCOUNTER — Encounter: Payer: Self-pay | Admitting: Obstetrics & Gynecology

## 2018-02-02 ENCOUNTER — Encounter: Payer: Self-pay | Admitting: Vascular Surgery

## 2018-02-04 ENCOUNTER — Telehealth: Payer: Self-pay

## 2018-02-04 NOTE — Telephone Encounter (Signed)
Returned patient call - patient advsd that she's been bleeding non stop since having IUD inserted - some light and heavy days...asked if this was normal.  Patient was advsd that the bleeding  - heavy and light - is normal and can last anywhere from 1-6 months due to having the IUD. Patient was advsd that if she begins feeling weak - extreme fatigue or lightheaded due to loss of blood to contact our office and be go to the nearest hospital for evaluation and treatment. Patient stated she understood and would.

## 2018-02-07 ENCOUNTER — Encounter: Payer: Federal, State, Local not specified - PPO | Admitting: Neurology

## 2018-02-07 ENCOUNTER — Other Ambulatory Visit: Payer: Self-pay | Admitting: Physician Assistant

## 2018-02-07 DIAGNOSIS — N921 Excessive and frequent menstruation with irregular cycle: Secondary | ICD-10-CM

## 2018-02-07 DIAGNOSIS — D508 Other iron deficiency anemias: Secondary | ICD-10-CM

## 2018-02-07 DIAGNOSIS — E559 Vitamin D deficiency, unspecified: Secondary | ICD-10-CM

## 2018-02-07 MED ORDER — VITAMIN D (ERGOCALCIFEROL) 1.25 MG (50000 UNIT) PO CAPS
ORAL_CAPSULE | ORAL | 1 refills | Status: DC
Start: 2018-02-07 — End: 2018-03-11

## 2018-02-07 NOTE — Telephone Encounter (Signed)
[facility] RF on Vit D 50000 units  RX last send 08-31-17 for 90 day supply with 1 RF  Last OV was 01-04-18 Last Vit D check in December 2018 No upcoming OV scheduled  RX pended. Please review and advise if appropriate  Thanks!

## 2018-02-08 ENCOUNTER — Ambulatory Visit (INDEPENDENT_AMBULATORY_CARE_PROVIDER_SITE_OTHER): Payer: Federal, State, Local not specified - PPO | Admitting: Neurology

## 2018-02-08 ENCOUNTER — Encounter: Payer: Self-pay | Admitting: Neurology

## 2018-02-08 ENCOUNTER — Ambulatory Visit: Payer: Federal, State, Local not specified - PPO | Admitting: Neurology

## 2018-02-08 DIAGNOSIS — R29898 Other symptoms and signs involving the musculoskeletal system: Secondary | ICD-10-CM | POA: Diagnosis not present

## 2018-02-08 DIAGNOSIS — M5417 Radiculopathy, lumbosacral region: Secondary | ICD-10-CM

## 2018-02-08 NOTE — Progress Notes (Addendum)
The patient comes in today for EMG and nerve conduction study evaluation.  The patient appears to have skin lesions that could be neurofibroma.  I have recommended that she be tested for neurofibromatosis type I.  The patient indicates that there is no family history, she is adopted.    April Vega    Nerve / Sites Muscle Latency Ref. Amplitude Ref. Rel Amp Segments Distance Velocity Ref. Area    ms ms mV mV %  cm m/s m/s mVms  L Peroneal - EDB     Ankle EDB 3.5 ?6.5 3.1 ?2.0 100 Ankle - EDB 9   7.9     Fib head EDB 8.7  2.4  78 Fib head - Ankle 27 52 ?44 7.9     Pop fossa EDB 10.5  2.2  93.4 Pop fossa - Fib head 10 55 ?44 8.5         Pop fossa - Ankle      L Tibial - AH     Ankle AH 4.2 ?5.8 9.2 ?4.0 100 Ankle - AH 9   11.9     Pop fossa AH 11.9  7.2  78.2 Pop fossa - Ankle 33 43 ?41 8.8         SNC    Nerve / Sites Rec. Site Peak Lat Ref.  Amp Ref. Segments Distance    ms ms V V  cm  L Sural - Ankle (Calf)     Calf Ankle 3.1 ?4.4 16 ?6 Calf - Ankle 14  L Superficial peroneal - Ankle     Lat leg Ankle 3.1 ?4.4 17 ?6 Lat leg - Ankle 14         F  Wave    Nerve F Lat Ref.   ms ms  L Tibial - AH 41.5 ?56.0       EMG full

## 2018-02-08 NOTE — Progress Notes (Signed)
Please refer to EMG and nerve conduction study procedure note. 

## 2018-02-08 NOTE — Procedures (Signed)
     HISTORY:  April Vega is a 47 year old patient with a history of a DVT involving the left leg in August 2018, since that time when she is sitting she feels somewhat weak with the left leg, she denies any numbness or discomfort.  She is being evaluated for this issue.  NERVE CONDUCTION STUDIES:  Nerve conduction studies were done on the left lower extremity.  The distal motor latencies and motor amplitudes for the left peroneal and posterior tibial nerves were normal with normal nerve conduction velocities for these nerves.  The left sural and peroneal sensory latencies were normal and the left posterior tibial F wave latency was normal.  EMG STUDIES:  EMG study was performed on the left lower extremity:  The tibialis anterior muscle reveals 2 to 4K motor units with full recruitment. No fibrillations or positive waves were seen. The peroneus tertius muscle reveals 2 to 5K motor units with decreased recruitment. No fibrillations or positive waves were seen. The medial gastrocnemius muscle reveals 2 to 4K motor units with decreased recruitment. No fibrillations or positive waves were seen. The vastus lateralis muscle reveals 2 to 4K motor units with full recruitment. No fibrillations or positive waves were seen. The iliopsoas muscle reveals 2 to 4K motor units with full recruitment. No fibrillations or positive waves were seen. The biceps femoris muscle (long head) reveals 2 to 4K motor units with full recruitment. No fibrillations or positive waves were seen. The lumbosacral paraspinal muscles were tested at 3 levels, and revealed no abnormalities of insertional activity at all 3 levels tested. There was good relaxation.   IMPRESSION:  Nerve conduction studies done on the left lower extremity were unremarkable, no evidence of a neuropathy is seen.  EMG evaluation of the left lower extremity shows chronic stable signs of denervation in the S1 innervated muscles, this study suggests a  chronic stable S1 radiculopathy.  April Alexanders MD 02/08/2018 11:08 AM  [facility] [address] [place], [place] [address]  Phone [phone] Fax [phone]

## 2018-02-17 ENCOUNTER — Encounter: Payer: Self-pay | Admitting: Obstetrics & Gynecology

## 2018-02-17 ENCOUNTER — Ambulatory Visit (INDEPENDENT_AMBULATORY_CARE_PROVIDER_SITE_OTHER): Payer: Federal, State, Local not specified - PPO | Admitting: Obstetrics & Gynecology

## 2018-02-17 VITALS — BP 120/78 | HR 85 | Wt 142.0 lb

## 2018-02-17 DIAGNOSIS — N938 Other specified abnormal uterine and vaginal bleeding: Secondary | ICD-10-CM | POA: Diagnosis not present

## 2018-02-17 MED ORDER — MEGESTROL ACETATE 40 MG PO TABS: 40.0000 mg | ORAL_TABLET | Freq: Two times a day (BID) | ORAL | 5 refills | Status: DC

## 2018-02-17 NOTE — Progress Notes (Signed)
PT has no complaints with IUD

## 2018-02-17 NOTE — Progress Notes (Signed)
   Subjective:    Patient ID: April Vega, female    DOB: [birth year], 47 y.o.   MRN: [id number]  HPI 47 yo married P2 here for a string check. She had a Mirena placed a month ago to treat her heavy bleeding. She reports that she has continued to have heavy bleeding with clots on most days of the past month.  Review of Systems     Objective:   Physical Exam  Breathing, conversing, and ambulating normally IUD strings not visible Bedside u/s shows no IUD in the uterus      Assessment & Plan:  Menometrorrhagia with fibroids- I will give her megace prn I have again offered ablation versus hysterectomy versus depo provera.

## 2018-02-18 LAB — CBC
HCT: 31.8 % — ABNORMAL LOW (ref 35.0–45.0)
Hemoglobin: 10.2 g/dL — ABNORMAL LOW (ref 11.7–15.5)
MCH: 26.7 pg — AB (ref 27.0–33.0)
MCHC: 32.1 g/dL (ref 32.0–36.0)
MCV: 83.2 fL (ref 80.0–100.0)
MPV: 9.5 fL (ref 7.5–12.5)
PLATELETS: 611 10*3/uL — AB (ref 140–400)
RBC: 3.82 10*6/uL (ref 3.80–5.10)
RDW: 16.8 % — AB (ref 11.0–15.0)
WBC: 7.4 10*3/uL (ref 3.8–10.8)

## 2018-02-21 ENCOUNTER — Encounter (HOSPITAL_COMMUNITY): Payer: Self-pay

## 2018-02-22 ENCOUNTER — Encounter: Payer: Self-pay | Admitting: Sports Medicine

## 2018-02-22 ENCOUNTER — Ambulatory Visit (INDEPENDENT_AMBULATORY_CARE_PROVIDER_SITE_OTHER): Payer: Federal, State, Local not specified - PPO | Admitting: Sports Medicine

## 2018-02-22 DIAGNOSIS — G5792 Unspecified mononeuropathy of left lower limb: Secondary | ICD-10-CM | POA: Diagnosis not present

## 2018-02-22 MED ORDER — GABAPENTIN 300 MG PO CAPS: ORAL_CAPSULE | ORAL | 3 refills | Status: DC
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
## 2018-02-25 ENCOUNTER — Other Ambulatory Visit: Payer: Self-pay

## 2018-02-25 ENCOUNTER — Encounter: Payer: Self-pay | Admitting: Vascular Surgery

## 2018-02-25 ENCOUNTER — Encounter: Payer: Self-pay | Admitting: *Deleted

## 2018-02-25 ENCOUNTER — Ambulatory Visit: Payer: Federal, State, Local not specified - PPO | Admitting: Vascular Surgery

## 2018-02-25 ENCOUNTER — Ambulatory Visit (HOSPITAL_COMMUNITY)
Admission: RE | Admit: 2018-02-25 | Discharge: 2018-02-25 | Disposition: A | Discharge status: Home / Self Care | Payer: Federal, State, Local not specified - PPO | Source: Ambulatory Visit | Attending: Vascular Surgery | Admitting: Vascular Surgery

## 2018-02-25 ENCOUNTER — Other Ambulatory Visit: Payer: Self-pay | Admitting: *Deleted

## 2018-02-25 VITALS — BP 133/87 | HR 72 | Resp 18 | Ht 62.0 in | Wt 141.4 lb

## 2018-02-25 DIAGNOSIS — I82413 Acute embolism and thrombosis of femoral vein, bilateral: Secondary | ICD-10-CM

## 2018-02-25 NOTE — Progress Notes (Signed)
Patient ID: April Vega, female   DOB: 12-20-1970, 47 y.o.   MRN: [id number]  Reason for Consult: Follow-up (6 month f/u - c/o bil LE pain)   Referred by Donella Stade, PA-C  Subjective:     HPI:  April Vega is a 47 y.o. 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  She underwent stenting of her left common and external iliac and common femoral veins.  At follow-up she was noted to have some occlusive disease in her right-sided common and external iliac veins which was thought to be from jailing of the stent.  She had no symptoms at that time.  She remains on Xarelto and aspirin.  She now follows up and is having increased swelling in her left lower extremity over the past 4 to 6 weeks.  She is also having heavy bloody vaginal discharge with clots and is wondering if the Xarelto is causing this.  She does not have any ulceration or tissue loss at this time.  She continues to work as a Pharmacist, hospital.  Past Medical History:  Diagnosis Date  . ADHD (attention deficit hyperactivity disorder)   . Anemia   . DVT (deep venous thrombosis) ([place])   . 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 Menometrorrhagia 01/02/2016   Family History  Problem Relation Age of Onset  . Diabetes Mother   . Hypertension Mother    Past Surgical History:  Procedure Laterality Date  . bone graph  01/1990  . CESAREAN SECTION    . INTRAVASCULAR ULTRASOUND/IVUS Left 05/12/2017   Procedure: Intravascular Ultrasound/IVUS;  Surgeon: Waynetta Sandy, MD;  Location: [place] [facility];  Service: Cardiovascular;  Laterality: Left;  IVC TO LT POPLITEAL VEIN  . LOWER EXTREMITY VENOGRAPHY Bilateral 05/11/2017   Procedure: Bilateral Lower Extremity Venography;  Surgeon: Serafina Mitchell, MD;  Location: [place] [facility];  Service: Cardiovascular;  Laterality: Bilateral;  . PERIPHERAL VASCULAR INTERVENTION Left 05/12/2017   Procedure: PERIPHERAL VASCULAR INTERVENTION;  Surgeon: Waynetta Sandy, MD;  Location: [place] [facility];  Service: Cardiovascular;  Laterality: Left;  IVC TO LT COMMON FEM VEIN  STENT  . PERIPHERAL VASCULAR THROMBECTOMY Left 05/11/2017   Procedure: PERIPHERAL VASCULAR THROMBECTOMY;  Surgeon: Serafina Mitchell, MD;  Location: [place] [facility];  Service: Cardiovascular;  Laterality: Left;  left lower extremity venous    Short Social History:  Social History   Tobacco Use  . Smoking status: Never Smoker  . Smokeless tobacco: Never Used  Substance Use Topics  . Alcohol use: No    Alcohol/week: 0.0 oz    Allergies  Allergen Reactions  . Wellbutrin [Bupropion] Other (See Comments)    Temporary memory loss  . Latex Rash    Current Outpatient Medications  Medication Sig Dispense Refill  . aspirin EC 81 MG EC tablet Take 1 tablet (81 mg total) by mouth daily.    . rivaroxaban (XARELTO) 20 MG TABS tablet Take 1 tablet (20 mg total) by mouth daily with supper. 90 tablet 2  . ALPRAZolam (XANAX) 0.5 MG tablet Take 1 tablet (0.5 mg total) by mouth at bedtime as needed for anxiety. (Patient not taking: Reported on 02/17/2018) 20 tablet 1  . AMBULATORY NON FORMULARY MEDICATION One walking cane to use for mobility due to DVT of left leg. (Patient not taking: Reported on 02/17/2018) 1 Device 0  . busPIRone (BUSPAR) 7.5 MG tablet Take 1 tablet (7.5 mg total) by mouth 2 (two) times daily. (  Patient not taking: Reported on 02/17/2018) 180 tablet 1  . FLUoxetine (PROZAC) 20 MG tablet Take 3 tablets (60 mg total) by mouth daily. (Patient not taking: Reported on 02/25/2018) 270 tablet 0  . gabapentin (NEURONTIN) 300 MG capsule One tab PO qHS for a week, then BID for a week, then TID. May double weekly to a max of 3,600mg /day (Patient not taking: Reported on 02/25/2018) 90 capsule 3  . Iron-FA-B Cmp-C-Biot-Probiotic (FUSION PLUS) CAPS Take 1 capsule by mouth daily. (Patient not taking: Reported on 02/25/2018) 90 capsule 3  . megestrol (MEGACE) 40 MG tablet Take 1  tablet (40 mg total) by mouth 2 (two) times daily. (Patient not taking: Reported on 02/25/2018) 60 tablet 5  . Vitamin D, Ergocalciferol, (DRISDOL) 50000 units CAPS capsule Take one tablet weekly (Patient not taking: Reported on 02/25/2018) 12 capsule 1   No current facility-administered medications for this visit.     Review of Systems  Constitutional:  Constitutional negative. HENT: HENT negative.  Eyes: Eyes negative.  Respiratory: Respiratory negative.  Cardiovascular: Positive for leg swelling.  GI: Gastrointestinal negative.  Musculoskeletal: Musculoskeletal negative. Positive for leg pain.  Skin: Skin negative.  Neurological: Neurological negative. Hematologic: Hematologic/lymphatic negative.  Psychiatric: Psychiatric negative.        Objective:  Objective   Vitals:   02/25/18 0856  BP: 133/87  Pulse: 72  Resp: 18  SpO2: 100%  Weight: 141 lb 6.4 oz (64.1 kg)  Height: 5\' 2"  (1.575 m)   Body mass index is 25.86 kg/m.  Physical Exam  Constitutional: She is oriented to person, place, and time. She appears well-developed.  HENT:  Head: Normocephalic.  Eyes: Pupils are equal, round, and reactive to light.  Neck: Normal range of motion.  Cardiovascular: Normal rate and regular rhythm.  Pulmonary/Chest: Effort normal.  Abdominal: Soft. She exhibits no mass.  Musculoskeletal: Normal range of motion. She exhibits edema.  Neurological: She is alert and oriented to person, place, and time.  Skin: Skin is warm and dry. Capillary refill takes less than 2 seconds.  Psychiatric: She has a normal mood and affect. Her behavior is normal. Judgment normal.    Data:  I have independently interpreted her IVC iliac study which demonstrates acute appearing thrombus in her right sided common iliac with some chronic in the external iliac veins.  On the left side she has recanalized what appeared to be occlusive disease in her stent.     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  She did have some occlusive disease on the right side which now appears acute.  I am really not sure what to make of the IVC iliac duplex studies today and I reviewed the images in depth.  I think at this time she should remain on Xarelto and aspirin and we will get her set up for venogram from both sides to evaluate for possible stenting.  She demonstrates good understanding we will get her set up in the near future.    Waynetta Sandy MD [facility]

## 2018-03-02 DIAGNOSIS — Z7901 Long term (current) use of anticoagulants: Secondary | ICD-10-CM | POA: Diagnosis not present

## 2018-03-02 DIAGNOSIS — Z6828 Body mass index (BMI) 28.0-28.9, adult: Secondary | ICD-10-CM | POA: Diagnosis not present

## 2018-03-02 DIAGNOSIS — F419 Anxiety disorder, unspecified: Secondary | ICD-10-CM | POA: Diagnosis not present

## 2018-03-02 DIAGNOSIS — N939 Abnormal uterine and vaginal bleeding, unspecified: Secondary | ICD-10-CM | POA: Diagnosis not present

## 2018-03-02 DIAGNOSIS — Z3202 Encounter for pregnancy test, result negative: Secondary | ICD-10-CM | POA: Diagnosis not present

## 2018-03-02 DIAGNOSIS — N938 Other specified abnormal uterine and vaginal bleeding: Secondary | ICD-10-CM | POA: Diagnosis not present

## 2018-03-02 DIAGNOSIS — Z888 Allergy status to other drugs, medicaments and biological substances status: Secondary | ICD-10-CM | POA: Diagnosis not present

## 2018-03-02 DIAGNOSIS — N920 Excessive and frequent menstruation with regular cycle: Secondary | ICD-10-CM | POA: Diagnosis not present

## 2018-03-02 DIAGNOSIS — D649 Anemia, unspecified: Secondary | ICD-10-CM | POA: Diagnosis not present

## 2018-03-02 DIAGNOSIS — Z91040 Latex allergy status: Secondary | ICD-10-CM | POA: Diagnosis not present

## 2018-03-02 DIAGNOSIS — N898 Other specified noninflammatory disorders of vagina: Secondary | ICD-10-CM | POA: Diagnosis not present

## 2018-03-02 DIAGNOSIS — Z79899 Other long term (current) drug therapy: Secondary | ICD-10-CM | POA: Diagnosis not present

## 2018-03-02 DIAGNOSIS — D500 Iron deficiency anemia secondary to blood loss (chronic): Secondary | ICD-10-CM | POA: Diagnosis not present

## 2018-03-03 DIAGNOSIS — N920 Excessive and frequent menstruation with regular cycle: Secondary | ICD-10-CM | POA: Diagnosis not present

## 2018-03-07 ENCOUNTER — Ambulatory Visit (HOSPITAL_COMMUNITY)
Admission: RE | Admit: 2018-03-07 | Discharge: 2018-03-07 | Disposition: A | Discharge status: Home / Self Care | Payer: Federal, State, Local not specified - PPO | Source: Ambulatory Visit | Attending: Vascular Surgery | Admitting: Vascular Surgery

## 2018-03-07 ENCOUNTER — Other Ambulatory Visit: Payer: Federal, State, Local not specified - PPO

## 2018-03-07 ENCOUNTER — Encounter (HOSPITAL_COMMUNITY)
Admission: RE | Disposition: A | Discharge status: Home / Self Care | Payer: Self-pay | Source: Ambulatory Visit | Attending: Vascular Surgery

## 2018-03-07 ENCOUNTER — Telehealth: Payer: Self-pay | Admitting: Vascular Surgery

## 2018-03-07 ENCOUNTER — Encounter (HOSPITAL_COMMUNITY): Payer: Self-pay | Admitting: Vascular Surgery

## 2018-03-07 DIAGNOSIS — I82523 Chronic embolism and thrombosis of iliac vein, bilateral: Secondary | ICD-10-CM | POA: Diagnosis not present

## 2018-03-07 DIAGNOSIS — R2241 Localized swelling, mass and lump, right lower limb: Secondary | ICD-10-CM | POA: Diagnosis not present

## 2018-03-07 DIAGNOSIS — R2242 Localized swelling, mass and lump, left lower limb: Secondary | ICD-10-CM | POA: Diagnosis not present

## 2018-03-07 HISTORY — PX: INTRAVASCULAR ULTRASOUND/IVUS: CATH118244

## 2018-03-07 HISTORY — PX: PERIPHERAL VASCULAR INTERVENTION: CATH118257

## 2018-03-07 HISTORY — PX: LOWER EXTREMITY VENOGRAPHY: CATH118253

## 2018-03-07 HISTORY — PX: PERIPHERAL VASCULAR BALLOON ANGIOPLASTY: CATH118281

## 2018-03-07 LAB — POCT I-STAT, CHEM 8
BUN: 12 mg/dL (ref 6–20)
CREATININE: 0.7 mg/dL (ref 0.44–1.00)
Calcium, Ion: 1.28 mmol/L (ref 1.15–1.40)
Chloride: 106 mmol/L (ref 101–111)
GLUCOSE: 86 mg/dL (ref 65–99)
HCT: 33 % — ABNORMAL LOW (ref 36.0–46.0)
HEMOGLOBIN: 11.2 g/dL — AB (ref 12.0–15.0)
Potassium: 3.8 mmol/L (ref 3.5–5.1)
Sodium: 141 mmol/L (ref 135–145)
TCO2: 21 mmol/L — ABNORMAL LOW (ref 22–32)

## 2018-03-07 LAB — PREGNANCY, URINE: Preg Test, Ur: NEGATIVE

## 2018-03-07 SURGERY — LOWER EXTREMITY VENOGRAPHY
Anesthesia: LOCAL | Laterality: Right

## 2018-03-07 MED ORDER — LIDOCAINE HCL (PF) 1 % IJ SOLN
INTRAMUSCULAR | Status: DC | PRN
  Administered 2018-03-07 (×2): 11 mL

## 2018-03-07 MED ORDER — MIDAZOLAM HCL 2 MG/2ML IJ SOLN
INTRAMUSCULAR | Status: DC | PRN
  Administered 2018-03-07 (×2): 1 mg via INTRAVENOUS

## 2018-03-07 MED ORDER — LIDOCAINE HCL (PF) 1 % IJ SOLN
INTRAMUSCULAR | Status: AC
  Filled 2018-03-07: qty 30

## 2018-03-07 MED ORDER — HEPARIN SODIUM (PORCINE) 1000 UNIT/ML IJ SOLN
INTRAMUSCULAR | Status: DC | PRN
  Administered 2018-03-07: 7000 [IU] via INTRAVENOUS

## 2018-03-07 MED ORDER — FENTANYL CITRATE (PF) 100 MCG/2ML IJ SOLN
INTRAMUSCULAR | Status: DC | PRN
  Administered 2018-03-07 (×3): 50 ug via INTRAVENOUS

## 2018-03-07 MED ORDER — IODIXANOL 320 MG/ML IV SOLN
INTRAVENOUS | Status: DC | PRN
  Administered 2018-03-07: 20 mL via INTRAVENOUS

## 2018-03-07 MED ORDER — FENTANYL CITRATE (PF) 100 MCG/2ML IJ SOLN
INTRAMUSCULAR | Status: AC
  Filled 2018-03-07: qty 2

## 2018-03-07 MED ORDER — SODIUM CHLORIDE 0.9% FLUSH: 3.0000 mL | INTRAVENOUS | Status: DC | PRN

## 2018-03-07 MED ORDER — SODIUM CHLORIDE 0.9 % WEIGHT BASED INFUSION: 1.0000 mL/kg/h | INTRAVENOUS | Status: DC

## 2018-03-07 MED ORDER — ONDANSETRON HCL 4 MG/2ML IJ SOLN: 4.0000 mg | Freq: Four times a day (QID) | INTRAMUSCULAR | Status: DC | PRN

## 2018-03-07 MED ORDER — LABETALOL HCL 5 MG/ML IV SOLN: 10.0000 mg | INTRAVENOUS | Status: DC | PRN

## 2018-03-07 MED ORDER — MIDAZOLAM HCL 2 MG/2ML IJ SOLN
INTRAMUSCULAR | Status: AC
  Filled 2018-03-07: qty 2

## 2018-03-07 MED ORDER — HEPARIN (PORCINE) IN NACL 1000-0.9 UT/500ML-% IV SOLN
INTRAVENOUS | Status: AC
  Filled 2018-03-07: qty 500

## 2018-03-07 MED ORDER — HEPARIN (PORCINE) IN NACL 2-0.9 UNITS/ML
INTRAMUSCULAR | Status: AC | PRN
  Administered 2018-03-07: 500 mL

## 2018-03-07 MED ORDER — SODIUM CHLORIDE 0.9 % IV SOLN: 250.0000 mL | INTRAVENOUS | Status: DC | PRN

## 2018-03-07 MED ORDER — ACETAMINOPHEN 325 MG PO TABS: 650.0000 mg | ORAL_TABLET | ORAL | Status: DC | PRN

## 2018-03-07 MED ORDER — HYDRALAZINE HCL 20 MG/ML IJ SOLN: 5.0000 mg | INTRAMUSCULAR | Status: DC | PRN

## 2018-03-07 MED ORDER — HEPARIN SODIUM (PORCINE) 1000 UNIT/ML IJ SOLN
INTRAMUSCULAR | Status: AC
  Filled 2018-03-07: qty 1

## 2018-03-07 MED ORDER — SODIUM CHLORIDE 0.9 % IV SOLN
INTRAVENOUS | Status: DC
  Administered 2018-03-07: 08:00:00 via INTRAVENOUS

## 2018-03-07 MED ORDER — SODIUM CHLORIDE 0.9% FLUSH: 3.0000 mL | Freq: Two times a day (BID) | INTRAVENOUS | Status: DC

## 2018-03-07 SURGICAL SUPPLY — 19 items
BALLN MUSTANG 10.0X40 75 (BALLOONS) ×4
BALLN MUSTANG 12.0X40 75 (BALLOONS) ×4
BALLOON MUSTANG 10.0X40 75 (BALLOONS) ×3 IMPLANT
BALLOON MUSTANG 12.0X40 75 (BALLOONS) ×3 IMPLANT
CATH ANGIO 5F BER2 65CM (CATHETERS) ×4 IMPLANT
CATH VISIONS PV .035 IVUS (CATHETERS) ×4 IMPLANT
COVER PRB 48X5XTLSCP FOLD TPE (BAG) ×3 IMPLANT
COVER PROBE 5X48 (BAG) ×1
KIT ENCORE 26 ADVANTAGE (KITS) ×4 IMPLANT
KIT MICROPUNCTURE NIT STIFF (SHEATH) ×4 IMPLANT
KIT PV (KITS) ×4 IMPLANT
SHEATH PINNACLE 5F 10CM (SHEATH) ×8 IMPLANT
SHEATH PINNACLE 8F 10CM (SHEATH) ×8 IMPLANT
SHEATH PINNACLE 9F 10CM (SHEATH) ×4 IMPLANT
STENT VICI VENOUS 14X60 (Permanent Stent) ×4 IMPLANT
STOPCOCK MORSE 400PSI 3WAY (MISCELLANEOUS) ×4 IMPLANT
TRAY PV CATH (CUSTOM PROCEDURE TRAY) ×4 IMPLANT
WIRE AMPLATZ SS-J .035X260CM (WIRE) ×8 IMPLANT
WIRE BENTSON .035X145CM (WIRE) ×4 IMPLANT

## 2018-03-07 NOTE — Discharge Instructions (Signed)
**Note -Identified via Obfuscation** Popliteal Site Care Refer to this sheet in the next few weeks. These instructions provide you with information about caring for yourself after your procedure. Your health care provider may also give you more specific instructions. Your treatment has been planned according to current medical practices, but problems sometimes occur. Call your health care provider if you have any problems or questions after your procedure. What can I expect after the procedure? After your procedure, it is typical to have the following:  Bruising at the site that usually fades within 1-2 weeks.  Blood collecting in the tissue (hematoma) that may be painful to the touch. It should usually decrease in size and tenderness within 1-2 weeks.  Follow these instructions at home:  Take medicines only as directed by your health care provider.  You may shower 24-48 hours after the procedure or as directed by your health care provider. Remove the bandage (dressing) and gently wash the site with plain soap and water. Pat the area dry with a clean towel. Do not rub the site, because this may cause bleeding.  Do not take baths, swim, or use a hot tub until your health care provider approves.  Check your insertion site every day for redness, swelling, or drainage.  Do not apply powder or lotion to the site.  Limit use of stairs to twice a day for the first 2-3 days or as directed by your health care provider.  Do not squat for the first 2-3 days or as directed by your health care provider.  Do not lift over 10 lb (4.5 kg) for 5 days after your procedure or as directed by your health care provider.  Ask your health care provider when it is okay to: ? Return to work or school. ? Resume usual physical activities or sports. ? Resume sexual activity.  Do not drive home if you are discharged the same day as the procedure. Have someone else drive you.  You may drive 24 hours after the procedure unless otherwise instructed by  your health care provider.  Do not operate machinery or power tools for 24 hours after the procedure or as directed by your health care provider.  If your procedure was done as an outpatient procedure, which means that you went home the same day as your procedure, a responsible adult should be with you for the first 24 hours after you arrive home.  Keep all follow-up visits as directed by your health care provider. This is important. Contact a health care provider if:  You have a fever.  You have chills.  You have increased bleeding from the site. Hold pressure on the site. Get help right away if:  You have unusual pain at the site.  You have redness, warmth, or swelling at the site.  You have drainage (other than a small amount of blood on the dressing) from the site.  The site is bleeding, and the bleeding does not stop after 30 minutes of holding steady pressure on the site.  Your leg or foot becomes pale, cool, tingly, or numb. This information is not intended to replace advice given to you by your health care provider. Make sure you discuss any questions you have with your health care provider. Document Released: 05/11/2014 Document Revised: 02/13/2016 Document Reviewed: 03/27/2014 Elsevier Interactive Patient Education  [employer].

## 2018-03-07 NOTE — H&P (Signed)
   History and Physical Update  The patient was interviewed and re-examined.  The patient's previous History and Physical has been reviewed and is unchanged from previous office visit. Plan unchanged from previous.   Kalee Broxton C. Donzetta Matters, MD [facility] [place] Office: [phone] Pager: [phone]   03/07/2018, 8:03 AM

## 2018-03-07 NOTE — Op Note (Signed)
Patient name: April Vega MRN: [id number] DOB: [birth year] Sex: female  03/07/2018 Pre-operative Diagnosis: History of extensive DVT, swelling bilateral lower extremities Post-operative diagnosis:  Same Surgeon:  Eda Paschal. Donzetta Matters, MD Procedure Performed: 1.  Ultrasound-guided cannulation of bilateral popliteal veins 2.  Intravascular ultrasound of bilateral popliteal, bilateral femoral, bilateral common femoral, bilateral external iliac, bilateral common iliac veins and inferior vena cava 3.  Stent of right common iliac vein with 14 x 35mm Vici 4.  Balloon angioplasty of existing left common iliac vein stent with 10 mm balloon 5.  Bilateral lower extremity venography as well as central venography 6.  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  She now has swelling of her bilateral lower extremities with suggestion of acute appearing thrombus on ultrasound.  She is indicated for bilateral ascending venography possible intervention.  Findings: The left common external iliac vein stents are patent as are the femoral common femoral veins down into the popliteal.  The right side the popliteal and femoral and common femoral veins are patent.  As you get into the common femoral vein there appears to be rouleaux flow up into the external and common iliac veins and the vein is occluded right at the distal aspect of the left common iliac vein stent.  We elected to first put angioplasty this area and then placed a 14 x 60 mm stent which had a lumen of 12 x 9 mm at completion.  The contralateral stent had a lumen of 10 x 10 mm both pre-and post stenting of the right side.  The flow at completion venography is equal bilaterally although the right sided femoral and common femoral veins are much larger likely due to previous obstructive process.  If patient has persistent  swelling she will need consideration of lower extremity reflex testing.   Procedure:  The patient was identified in the holding area and taken to room 8.  The patient was then placed prone on the table in her bilateral popliteal fossae were prepped and draped in the usual fashion and timeout was called.  She was given moderate sedation for a total of 53 minutes.  We used ultrasound guidance initially to identify the popliteal vein on the left which was noted to be patent and compressible.  This was cannulated with micropuncture needle and a wire and sheath were placed.  We then used a Bentson wire to place a 5 [place] sheath and a bare catheter to cross into the IVC.  An 8Fr sheath was placed. We exchanged for a long Amplatz wire and then performed intravascular ultrasound from the popliteal all the way to the IVC which demonstrated patency.  We then repeated the process on the right side using ultrasound to identify a patent popliteal vein which was cannulated micropuncture needle followed by wire and sheath.  Then placed a Bentson wire and an 8 [place] sheath.  Intravascular ultrasound was performed from the right popliteal vein all the way through the IVC with the findings of rouleaux flow from the common femoral up and occluded common iliac vein for a very short segment approximately 2 cm right where the other stent was placed.  With this we exchanged for a 9 French sheath and placed an Amplatz wire into the innominate vein under fluoroscopic guidance.  We first balloon angioplastied our common iliac vein with a 10 mm balloon.  We  then placed the ivus catheter into our left-sided stent.  A new 14 x 60 mm stent was brought to the table and deployed the exact area of the other stent using IVIS from the contralateral side and magnification fluoroscopy.  Following this we first balloon angioplasty the new stent with a 10 mm balloon and then intravascular ultrasound demonstrated still small area.  We then put the 10  mm balloon in the left-sided stent and inflated it and then brought a 12 mm balloon and inflated the new stent.  At completion we had the diameters above.  We then performed venography bilaterally which demonstrated patency throughout.  Satisfied with this the sheath removed and pressure was held.  She tolerated the procedure well without immediate comp occasion.  Contrast: 20cc   Gizelle Whetsel C. Donzetta Matters, MD [facility] Office: [phone] Pager: [phone]

## 2018-03-07 NOTE — Telephone Encounter (Signed)
sch appt 04/15/18 8am IVC/iliac 845am p/o MD

## 2018-03-09 ENCOUNTER — Other Ambulatory Visit: Payer: Self-pay

## 2018-03-09 DIAGNOSIS — I82432 Acute embolism and thrombosis of left popliteal vein: Secondary | ICD-10-CM

## 2018-03-09 LAB — HM PAP SMEAR: HM PAP: NEGATIVE

## 2018-03-11 ENCOUNTER — Other Ambulatory Visit: Payer: Self-pay

## 2018-03-11 ENCOUNTER — Inpatient Hospital Stay: Payer: Federal, State, Local not specified - PPO

## 2018-03-11 ENCOUNTER — Inpatient Hospital Stay: Payer: Federal, State, Local not specified - PPO | Attending: Hematology & Oncology | Admitting: Family

## 2018-03-11 ENCOUNTER — Ambulatory Visit: Payer: Federal, State, Local not specified - PPO | Admitting: Vascular Surgery

## 2018-03-11 ENCOUNTER — Encounter: Payer: Self-pay | Admitting: Family

## 2018-03-11 ENCOUNTER — Encounter (HOSPITAL_COMMUNITY): Payer: Federal, State, Local not specified - PPO

## 2018-03-11 VITALS — BP 127/73

## 2018-03-11 VITALS — BP 142/83 | HR 90 | Temp 98.3°F | Resp 18 | Wt 142.0 lb

## 2018-03-11 DIAGNOSIS — Z86718 Personal history of other venous thrombosis and embolism: Secondary | ICD-10-CM | POA: Diagnosis not present

## 2018-03-11 DIAGNOSIS — D500 Iron deficiency anemia secondary to blood loss (chronic): Secondary | ICD-10-CM | POA: Diagnosis not present

## 2018-03-11 DIAGNOSIS — R5383 Other fatigue: Secondary | ICD-10-CM | POA: Diagnosis not present

## 2018-03-11 DIAGNOSIS — Z7901 Long term (current) use of anticoagulants: Secondary | ICD-10-CM | POA: Diagnosis not present

## 2018-03-11 DIAGNOSIS — N920 Excessive and frequent menstruation with regular cycle: Secondary | ICD-10-CM | POA: Diagnosis not present

## 2018-03-11 DIAGNOSIS — R531 Weakness: Secondary | ICD-10-CM | POA: Diagnosis not present

## 2018-03-11 LAB — CMP (CANCER CENTER ONLY)
ALT: 16 U/L (ref 10–47)
AST: 16 U/L (ref 11–38)
Albumin: 3.4 g/dL — ABNORMAL LOW (ref 3.5–5.0)
Alkaline Phosphatase: 41 U/L (ref 26–84)
Anion gap: 10 (ref 5–15)
BUN: 9 mg/dL (ref 7–22)
CHLORIDE: 110 mmol/L — AB (ref 98–108)
CO2: 24 mmol/L (ref 18–33)
Calcium: 9.1 mg/dL (ref 8.0–10.3)
Creatinine: 1 mg/dL (ref 0.60–1.20)
GLUCOSE: 112 mg/dL (ref 73–118)
POTASSIUM: 4 mmol/L (ref 3.3–4.7)
SODIUM: 144 mmol/L (ref 128–145)
Total Bilirubin: 0.6 mg/dL (ref 0.2–1.6)
Total Protein: 7.1 g/dL (ref 6.4–8.1)

## 2018-03-11 LAB — CBC WITH DIFFERENTIAL (CANCER CENTER ONLY)
Basophils Absolute: 0.2 10*3/uL — ABNORMAL HIGH (ref 0.0–0.1)
Basophils Relative: 2 %
EOS ABS: 0.4 10*3/uL (ref 0.0–0.5)
EOS PCT: 5 %
HCT: 31.5 % — ABNORMAL LOW (ref 34.8–46.6)
Hemoglobin: 9.9 g/dL — ABNORMAL LOW (ref 11.6–15.9)
LYMPHS ABS: 1.4 10*3/uL (ref 0.9–3.3)
Lymphocytes Relative: 18 %
MCH: 26.2 pg (ref 26.0–34.0)
MCHC: 31.4 g/dL — AB (ref 32.0–36.0)
MCV: 83.3 fL (ref 81.0–101.0)
MONOS PCT: 12 %
Monocytes Absolute: 0.9 10*3/uL (ref 0.1–0.9)
Neutro Abs: 5 10*3/uL (ref 1.5–6.5)
Neutrophils Relative %: 63 %
PLATELETS: 661 10*3/uL — AB (ref 145–400)
RBC: 3.78 MIL/uL (ref 3.70–5.32)
RDW: 17 % — AB (ref 11.1–15.7)
WBC: 7.9 10*3/uL (ref 3.9–10.0)

## 2018-03-11 MED ORDER — SODIUM CHLORIDE 0.9 % IV SOLN
510.0000 mg | Freq: Once | INTRAVENOUS | Status: AC
  Administered 2018-03-11: 510 mg via INTRAVENOUS
  Filled 2018-03-11: qty 17

## 2018-03-11 MED ORDER — SODIUM CHLORIDE 0.9 % IV SOLN
Freq: Once | INTRAVENOUS | Status: AC
  Administered 2018-03-11: 11:00:00 via INTRAVENOUS

## 2018-03-11 NOTE — Progress Notes (Signed)
Hematology and Oncology Follow Up Visit  April Vega [id number] [birth year] 47 y.o. 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 is here today for follow-up. She had a D&C earlier this month and required transfusion for Hgb of 6. She has passed some clots but has not had a cycle yet.  She is feeling fatigued and weak. Her Hgb is 9.9. She required iron in April with Hgb of 11.2.  She is back on Xarelto and has had no bleeding, no bruising or petechiae.  She has had no fever, chills, n/v, cough, rash, dizziness, SOB, chest pain, palpitations, abdominal pain or changes in bowel or bladder habits.  No swelling, tenderness, numbness or tingling in her extremities. No c/o pain.  She has a good appetite and is staying well hydrated. Her weight is stable.   ECOG Performance Status: 1 - Symptomatic but completely ambulatory  Medications:  Allergies as of 03/11/2018      Reactions   Wellbutrin [bupropion] Other (See Comments)   Temporary memory loss   Latex Rash      Medication List        Accurate as of 03/11/18 10:55 AM. Always use your most recent med list.          ALPRAZolam 0.5 MG tablet Commonly known as:  XANAX Take 1 tablet (0.5 mg total) by mouth at bedtime as needed for anxiety.   AMBULATORY NON FORMULARY MEDICATION One walking cane to use for mobility due to DVT of left leg.   aspirin 81 MG EC tablet Take 1 tablet (81 mg total) by mouth daily.   busPIRone 7.5 MG tablet Commonly known as:  BUSPAR Take 1 tablet (7.5 mg total) by mouth 2 (two) times daily.   FLUoxetine 20 MG tablet Commonly known as:  PROZAC Take 3 tablets (60 mg total) by mouth daily.   FUSION PLUS Caps Take 1 capsule by mouth daily.   gabapentin 300 MG capsule Commonly known as:  NEURONTIN One tab PO qHS for a week, then BID for a week, then  TID. May double weekly to a max of 3,600mg /day   megestrol 40 MG tablet Commonly known as:  MEGACE Take 1 tablet (40 mg total) by mouth 2 (two) times daily.   rivaroxaban 20 MG Tabs tablet Commonly known as:  XARELTO Take 1 tablet (20 mg total) by mouth daily with supper.   Vitamin D (Ergocalciferol) 50000 units Caps capsule Commonly known as:  DRISDOL Take one tablet weekly       Allergies:  Allergies  Allergen Reactions  . Wellbutrin [Bupropion] Other (See Comments)    Temporary memory loss  . Latex Rash    Past Medical History, Surgical history, Social history, and Family History were reviewed and updated.  Review of Systems: All other 10 point review of systems is negative.   Physical Exam:  vitals were not taken for this visit.   Wt Readings from Last 3 Encounters:  03/07/18 142 lb (64.4 kg)  02/25/18 141 lb 6.4 oz (64.1 kg)  02/22/18 142 lb (64.4 kg)    Ocular: Sclerae unicteric, pupils equal, round and reactive to light Ear-nose-throat: Oropharynx clear, dentition fair Lymphatic: No cervical, supraclavicular or axillary adenopathy Lungs no rales or rhonchi, good excursion bilaterally Heart regular rate and rhythm, no murmur appreciated Abd soft, nontender, positive bowel sounds, no liver  or spleen tip palpated on exam, no fluid wave  MSK no focal spinal tenderness, no joint edema Neuro: non-focal, well-oriented, appropriate affect Breasts: Deferred   Lab Results  Component Value Date   WBC 7.9 03/11/2018   HGB 9.9 (L) 03/11/2018   HCT 31.5 (L) 03/11/2018   MCV 83.3 03/11/2018   PLT 661 (H) 03/11/2018   Lab Results  Component Value Date   FERRITIN 14 01/03/2018   IRON 20 (L) 01/03/2018   TIBC 318 01/03/2018   UIBC 298 01/03/2018   IRONPCTSAT 6 (L) 01/03/2018   Lab Results  Component Value Date   RBC 3.78 03/11/2018   No results found for: KPAFRELGTCHN, LAMBDASER, KAPLAMBRATIO No results found for: IGGSERUM, IGA, IGMSERUM No results found  for: Odetta Pink, SPEI   Chemistry      Component Value Date/Time   NA 141 03/07/2018 0810   NA 140 01/02/2016 0928   K 3.8 03/07/2018 0810   K 3.8 01/02/2016 0928   CL 106 03/07/2018 0810   CO2 25 01/03/2018 1040   CO2 23 01/02/2016 0928   BUN 12 03/07/2018 0810   BUN 6.8 (L) 01/02/2016 0928   CREATININE 0.70 03/07/2018 0810   CREATININE 0.81 01/03/2018 1040   CREATININE 0.69 09/01/2017 1140   CREATININE 0.7 01/02/2016 0928      Component Value Date/Time   CALCIUM 9.8 01/03/2018 1040   CALCIUM 9.6 01/02/2016 0928   ALKPHOS 54 01/03/2018 1040   ALKPHOS 39 (L) 01/02/2016 0928   AST 16 01/03/2018 1040   AST 14 01/02/2016 0928   ALT 14 01/03/2018 1040   ALT 10 01/02/2016 0928   BILITOT <0.2 (L) 01/03/2018 1040   BILITOT 0.32 01/02/2016 0928      Impression and Plan: [name] is a very pleasant 47 yo African American female with iron deficiency anemia secondary to heavy cycles. She had a D&C earlier this month and required a blood transfusion (2 units). She is still symptomatic with fatigued and weakness.  We will give her IV iron today and again next week.  She continues to do well on Xarelto and so far there has been no evidence of recurrent thrombus.  We will plan to see her back in another 6 weeks for follow-up.  She will contact our office with any questions or concerns. We can certainly see her sooner if need be.   Laverna Peace, NP 6/21/201910:55 AM

## 2018-03-11 NOTE — Patient Instructions (Signed)

## 2018-03-14 ENCOUNTER — Ambulatory Visit (INDEPENDENT_AMBULATORY_CARE_PROVIDER_SITE_OTHER): Payer: Federal, State, Local not specified - PPO

## 2018-03-14 DIAGNOSIS — G5792 Unspecified mononeuropathy of left lower limb: Secondary | ICD-10-CM

## 2018-03-14 DIAGNOSIS — R29818 Other symptoms and signs involving the nervous system: Secondary | ICD-10-CM | POA: Diagnosis not present

## 2018-03-14 DIAGNOSIS — R531 Weakness: Secondary | ICD-10-CM | POA: Diagnosis not present

## 2018-03-14 LAB — IRON AND TIBC
IRON: 12 ug/dL — AB (ref 41–142)
Saturation Ratios: 4 % — ABNORMAL LOW (ref 21–57)
TIBC: 315 ug/dL (ref 236–444)
UIBC: 303 ug/dL

## 2018-03-14 LAB — FERRITIN: FERRITIN: 15 ng/mL (ref 9–269)

## 2018-03-14 MED ORDER — GADOBENATE DIMEGLUMINE 529 MG/ML IV SOLN
15.0000 mL | Freq: Once | INTRAVENOUS | Status: AC | PRN
  Administered 2018-03-14: 13 mL via INTRAVENOUS

## 2018-03-16 DIAGNOSIS — Z888 Allergy status to other drugs, medicaments and biological substances status: Secondary | ICD-10-CM | POA: Diagnosis not present

## 2018-03-16 DIAGNOSIS — F988 Other specified behavioral and emotional disorders with onset usually occurring in childhood and adolescence: Secondary | ICD-10-CM | POA: Diagnosis not present

## 2018-03-16 DIAGNOSIS — Z7902 Long term (current) use of antithrombotics/antiplatelets: Secondary | ICD-10-CM | POA: Diagnosis not present

## 2018-03-16 DIAGNOSIS — N939 Abnormal uterine and vaginal bleeding, unspecified: Secondary | ICD-10-CM | POA: Diagnosis not present

## 2018-03-16 DIAGNOSIS — N938 Other specified abnormal uterine and vaginal bleeding: Secondary | ICD-10-CM | POA: Diagnosis not present

## 2018-03-16 DIAGNOSIS — Z91040 Latex allergy status: Secondary | ICD-10-CM | POA: Diagnosis not present

## 2018-03-16 DIAGNOSIS — Z79899 Other long term (current) drug therapy: Secondary | ICD-10-CM | POA: Diagnosis not present

## 2018-03-16 DIAGNOSIS — R51 Headache: Secondary | ICD-10-CM | POA: Diagnosis not present

## 2018-03-16 DIAGNOSIS — F419 Anxiety disorder, unspecified: Secondary | ICD-10-CM | POA: Diagnosis not present

## 2018-03-16 DIAGNOSIS — E559 Vitamin D deficiency, unspecified: Secondary | ICD-10-CM | POA: Diagnosis not present

## 2018-03-16 DIAGNOSIS — D649 Anemia, unspecified: Secondary | ICD-10-CM | POA: Diagnosis not present

## 2018-03-18 ENCOUNTER — Inpatient Hospital Stay: Payer: Federal, State, Local not specified - PPO

## 2018-03-18 ENCOUNTER — Ambulatory Visit: Payer: Federal, State, Local not specified - PPO | Admitting: Vascular Surgery

## 2018-03-18 ENCOUNTER — Other Ambulatory Visit: Payer: Self-pay

## 2018-03-18 ENCOUNTER — Encounter (HOSPITAL_COMMUNITY): Payer: Federal, State, Local not specified - PPO

## 2018-03-18 VITALS — BP 129/82 | HR 54 | Temp 98.4°F | Resp 18

## 2018-03-18 DIAGNOSIS — Z7901 Long term (current) use of anticoagulants: Secondary | ICD-10-CM | POA: Diagnosis not present

## 2018-03-18 DIAGNOSIS — Z86718 Personal history of other venous thrombosis and embolism: Secondary | ICD-10-CM | POA: Diagnosis not present

## 2018-03-18 DIAGNOSIS — D500 Iron deficiency anemia secondary to blood loss (chronic): Secondary | ICD-10-CM | POA: Diagnosis not present

## 2018-03-18 DIAGNOSIS — N920 Excessive and frequent menstruation with regular cycle: Secondary | ICD-10-CM | POA: Diagnosis not present

## 2018-03-18 MED ORDER — HEPARIN SOD (PORK) LOCK FLUSH 100 UNIT/ML IV SOLN
500.0000 [IU] | Freq: Once | INTRAVENOUS | Status: DC | PRN
  Filled 2018-03-18: qty 5

## 2018-03-18 MED ORDER — SODIUM CHLORIDE 0.9 % IJ SOLN
10.0000 mL | INTRAMUSCULAR | Status: DC | PRN
  Filled 2018-03-18: qty 10

## 2018-03-18 MED ORDER — SODIUM CHLORIDE 0.9 % IV SOLN
510.0000 mg | Freq: Once | INTRAVENOUS | Status: AC
  Administered 2018-03-18: 510 mg via INTRAVENOUS
  Filled 2018-03-18: qty 17

## 2018-03-18 NOTE — Patient Instructions (Signed)

## 2018-03-31 ENCOUNTER — Other Ambulatory Visit: Payer: Self-pay | Admitting: Physician Assistant

## 2018-04-01 ENCOUNTER — Encounter (HOSPITAL_COMMUNITY): Payer: Federal, State, Local not specified - PPO

## 2018-04-01 ENCOUNTER — Other Ambulatory Visit: Payer: Self-pay | Admitting: *Deleted

## 2018-04-01 ENCOUNTER — Ambulatory Visit: Payer: Federal, State, Local not specified - PPO | Admitting: Vascular Surgery

## 2018-04-01 ENCOUNTER — Other Ambulatory Visit: Payer: Self-pay | Admitting: Physician Assistant

## 2018-04-01 DIAGNOSIS — F411 Generalized anxiety disorder: Secondary | ICD-10-CM

## 2018-04-01 DIAGNOSIS — R4589 Other symptoms and signs involving emotional state: Secondary | ICD-10-CM

## 2018-04-01 DIAGNOSIS — F329 Major depressive disorder, single episode, unspecified: Principal | ICD-10-CM

## 2018-04-01 MED ORDER — RIVAROXABAN 20 MG PO TABS: 20.0000 mg | ORAL_TABLET | Freq: Every day | ORAL | 2 refills | Status: DC

## 2018-04-08 ENCOUNTER — Ambulatory Visit: Payer: Federal, State, Local not specified - PPO | Admitting: Family Medicine

## 2018-04-11 ENCOUNTER — Ambulatory Visit (INDEPENDENT_AMBULATORY_CARE_PROVIDER_SITE_OTHER): Payer: Federal, State, Local not specified - PPO | Admitting: Physician Assistant

## 2018-04-11 ENCOUNTER — Encounter: Payer: Self-pay | Admitting: Physician Assistant

## 2018-04-11 VITALS — BP 114/78 | HR 76 | Ht 61.5 in | Wt 146.0 lb

## 2018-04-11 DIAGNOSIS — F411 Generalized anxiety disorder: Secondary | ICD-10-CM | POA: Diagnosis not present

## 2018-04-11 DIAGNOSIS — Z1329 Encounter for screening for other suspected endocrine disorder: Secondary | ICD-10-CM | POA: Diagnosis not present

## 2018-04-11 DIAGNOSIS — Z131 Encounter for screening for diabetes mellitus: Secondary | ICD-10-CM

## 2018-04-11 DIAGNOSIS — Z130 Encounter for screening for diseases of the blood and blood-forming organs and certain disorders involving the immune mechanism: Secondary | ICD-10-CM

## 2018-04-11 DIAGNOSIS — Z6827 Body mass index (BMI) 27.0-27.9, adult: Secondary | ICD-10-CM

## 2018-04-11 DIAGNOSIS — E663 Overweight: Secondary | ICD-10-CM | POA: Insufficient documentation

## 2018-04-11 DIAGNOSIS — N939 Abnormal uterine and vaginal bleeding, unspecified: Secondary | ICD-10-CM | POA: Insufficient documentation

## 2018-04-11 DIAGNOSIS — R635 Abnormal weight gain: Secondary | ICD-10-CM | POA: Diagnosis not present

## 2018-04-11 MED ORDER — SERTRALINE HCL 25 MG PO TABS: ORAL_TABLET | ORAL | 0 refills | Status: DC

## 2018-04-11 NOTE — Progress Notes (Signed)
HPI:                                                                April Vega is a 47 y.o. female who presents to [facility] today for depression/anxiety/ weight gain  Depression         This is a chronic problem.  The current episode started more than 1 year ago.   The onset quality is undetermined.   The problem occurs rarely.  The problem has been gradually improving since onset.  Associated symptoms include decreased concentration, fatigue, insomnia and appetite change.  Associated symptoms include no helplessness, no hopelessness, no decreased interest and no suicidal ideas.     The symptoms are aggravated by nothing.  Past treatments include SSRIs - Selective serotonin reuptake inhibitors and SNRIs - Serotonin and norepinephrine reuptake inhibitors.  Compliance with treatment is variable.  Past compliance problems include medication issues.  Risk factors include a recent illness and history of mental illness.   Past medical history includes chronic fatigue syndrome.   Self-discontinued her Fluoxetine in April approx 3 months ago because she was undergoing multiple surgical procedures. Requesting to re-start "something" today. She did not feel like Fluoxetine was ever effective. Prior meds: Celexa, Lexapro, Cymbalta  Weight gain: weight 1 year ago was between 125-130, this has been a healthy adult weight for her for years. Current weight 146 lb, for approx 2 months. Associated with abdominal fullness and tenderness and early satiety as well as irregular menses. Reports she has always had a poor diet, skips breakfast, eats lots of chocolate.  Depression screen [facility] 2/9 04/11/2018 01/04/2018 08/31/2017 12/23/2016 10/04/2013  Decreased Interest 0 2 - 3 0  Down, Depressed, Hopeless 0 0 2 2 0  PHQ - 2 Score 0 2 2 5  0  Altered sleeping 1 2 1  - -  Tired, decreased energy 2 2 2 3  -  Change in appetite 2 0 0 2 -  Feeling bad or failure about yourself   0 0 0 0 -  Trouble concentrating 2 2 3 2  -  Moving slowly or fidgety/restless 0 0 0 0 -  Suicidal thoughts 0 0 0 0 -  PHQ-9 Score 7 8 8  - -  Difficult doing work/chores Somewhat difficult Not difficult at all - - -    GAD 7 : Generalized Anxiety Score 04/11/2018 01/04/2018 08/31/2017  Nervous, Anxious, on Edge 0 2 2  Control/stop worrying 2 1 0  Worry too much - different things 1 1 1   Trouble relaxing 2 2 1   Restless 1 0 1  Easily annoyed or irritable 1 0 1  Afraid - awful might happen 0 0 0  Total GAD 7 Score 7 6 6   Anxiety Difficulty Somewhat difficult Not difficult at all -      Past Medical History:  Diagnosis Date  . ADHD (attention deficit hyperactivity disorder)   . Anemia   . DVT (deep venous thrombosis) ([place])   . 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 Menometrorrhagia 01/02/2016   Past Surgical History:  Procedure Laterality Date  . bone graph  01/1990  . CESAREAN SECTION    . INTRAVASCULAR ULTRASOUND/IVUS Left 05/12/2017   Procedure: Intravascular Ultrasound/IVUS;  Surgeon: Waynetta Sandy, MD;  Location: [facility];  Service: Cardiovascular;  Laterality: Left;  IVC TO LT POPLITEAL VEIN  . INTRAVASCULAR ULTRASOUND/IVUS N/A 03/07/2018   Procedure: INTRAVASCULAR ULTRASOUND/IVUS;  Surgeon: Waynetta Sandy, MD;  Location: [facility];  Service: Cardiovascular;  Laterality: N/A;  . LOWER EXTREMITY VENOGRAPHY Bilateral 05/11/2017   Procedure: Bilateral Lower Extremity Venography;  Surgeon: Serafina Mitchell, MD;  Location: [facility];  Service: Cardiovascular;  Laterality: Bilateral;  . LOWER EXTREMITY VENOGRAPHY N/A 03/07/2018   Procedure: LOWER EXTREMITY VENOGRAPHY;  Surgeon: Waynetta Sandy, MD;  Location: [facility];  Service: Cardiovascular;  Laterality: N/A;  . PERIPHERAL VASCULAR BALLOON ANGIOPLASTY Left 03/07/2018   Procedure: PERIPHERAL VASCULAR BALLOON ANGIOPLASTY;  Surgeon: Waynetta Sandy, MD;   Location: [facility];  Service: Cardiovascular;  Laterality: Left;  left common iliac vein  . PERIPHERAL VASCULAR INTERVENTION Left 05/12/2017   Procedure: PERIPHERAL VASCULAR INTERVENTION;  Surgeon: Waynetta Sandy, MD;  Location: [facility];  Service: Cardiovascular;  Laterality: Left;  IVC TO LT COMMON FEM VEIN  STENT  . PERIPHERAL VASCULAR INTERVENTION Right 03/07/2018   Procedure: PERIPHERAL VASCULAR INTERVENTION;  Surgeon: Waynetta Sandy, MD;  Location: [facility];  Service: Cardiovascular;  Laterality: Right;  right common iliac vein  . PERIPHERAL VASCULAR THROMBECTOMY Left 05/11/2017   Procedure: PERIPHERAL VASCULAR THROMBECTOMY;  Surgeon: Serafina Mitchell, MD;  Location: [facility];  Service: Cardiovascular;  Laterality: Left;  left lower extremity venous   Social History   Tobacco Use  . Smoking status: Never Smoker  . Smokeless tobacco: Never Used  Substance Use Topics  . Alcohol use: No    Alcohol/week: 0.0 oz   family history includes Diabetes in her mother; Hypertension in her mother.    ROS: negative except as noted in the HPI  Medications: Current Outpatient Medications  Medication Sig Dispense Refill  . Iron-FA-B Cmp-C-Biot-Probiotic (FUSION PLUS) CAPS Take 1 capsule by mouth daily. (Patient taking differently: Take 1 capsule by mouth at bedtime. ) 90 capsule 3  . montelukast (SINGULAIR) 10 MG tablet TAKE 1 TABLET (10 MG TOTAL) BY MOUTH AT BEDTIME. 90 tablet 0  . rivaroxaban (XARELTO) 20 MG TABS tablet Take 1 tablet (20 mg total) by mouth daily with supper. 90 tablet 2  . aspirin 81 MG tablet Take by mouth.    . sertraline (ZOLOFT) 25 MG tablet Take 0.5 tablets (12.5 mg total) by mouth daily for 3 days, THEN 1 tablet (25 mg total) daily for 3 days, THEN 2 tablets (50 mg total) daily for 24 days. (Patient not taking: No sig reported) 60 tablet 0   No current facility-administered medications for this visit.     Allergies  Allergen Reactions  . Wellbutrin [Bupropion] Other (See Comments)    Temporary memory loss  . Latex Rash and Swelling       Objective:  BP 114/78   Pulse 76   Ht 5' 1.5" (1.562 m)   Wt 146 lb (66.2 kg)   BMI 27.14 kg/m  Gen:  alert, not ill-appearing, no distress, appropriate for age 20: head normocephalic without obvious abnormality, conjunctiva and cornea clear, trachea midline Pulm: Normal work of breathing, normal phonation GI: abdomen soft, non-distended, non-tender Neuro: alert and oriented x 3, no tremor MSK: extremities atraumatic, normal gait and station Skin: intact, no rashes on exposed skin, no jaundice, no cyanosis Psych: well-groomed, cooperative, good eye contact, euthymic mood, affect mood-congruent, speech is articulate,  and thought processes clear and goal-directed    No results found for this or any previous visit (from the past 72 hour(s)). No results found.    Assessment and Plan: 47 y.o. 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 Patient elected to try alternative SSRI (has failed Celexa, Lexapro, Cymbalta and Fluoxetine). Will start Sertraline self-titration: 12.5 mg QHS x 3 days, 25 mg QHS x 3 days then 50 mg QHS. Encouraged follow-up with PCP in 4 weeks  Abnormal weight gain - likely 2/2 poorly controlled mood disorder, sleep  disturbance, poor eating habits and possibly perimenopause - checking routine labs - 1100 calorie moderate protein eating plan - recommended DASH or Mediterranean eating plans - increased physical activity   Patient education and anticipatory guidance given Patient agrees with treatment plan Follow-up in 1 month or sooner as needed if symptoms worsen or fail to improve  Darlyne Russian PA-C

## 2018-04-11 NOTE — Patient Instructions (Addendum)
1100 calories/day to lose 0.5-1 pound per week - 30-35% calories from protein - 30% calories from fat - 35-40% from carbs: If diabetic, limit carbs to 30 g per meal and 15 g per snack - MEASURE your calories (MyFitnessPal, LoseIt app of some sort) - 8-11 glasses of water per day - limit caffeine to 1 unsweetened beverage per day - avoid fried foods, saturated fat, and heavily processed foods - keep sugar as low as possible  - follow DASH or Mediterranean diets for recipes  Preventing Unhealthy Weight Gain, Adult Staying at a healthy weight is important. When fat builds up in your body, you may become overweight or obese. These conditions put you at greater risk for developing certain health problems, such as heart disease, diabetes, sleeping problems, joint problems, and some cancers. Unhealthy weight gain is often the result of making unhealthy choices in what you eat. It is also a result of not getting enough exercise. You can make changes to your lifestyle to prevent obesity and stay as healthy as possible. What nutrition changes can be made? To maintain a healthy weight and prevent obesity:  Eat only as much as your body needs. To do this: ? Pay attention to signs that you are hungry or full. Stop eating as soon as you feel full. ? If you feel hungry, try drinking water first. Drink enough water so your urine is clear or pale yellow. ? Eat smaller portions. ? Look at serving sizes on food labels. Most foods contain more than one serving per container. ? Eat the recommended amount of calories for your gender and activity level. While most active people should eat around 2,000 calories per day, if you are trying to lose weight or are not very active, you main need to eat less calories. Talk to your health care provider or dietitian about how many calories you should eat each day.  Choose healthy foods, such as: ? Fruits and vegetables. Try to fill at least half of your plate at each meal  with fruits and vegetables. ? Whole grains, such as whole wheat bread, brown rice, and quinoa. ? Lean meats, such as chicken or fish. ? Other healthy proteins, such as beans, eggs, or tofu. ? Healthy fats, such as nuts, seeds, fatty fish, and olive oil. ? Low-fat or fat-free dairy.  Check food labels and avoid food and drinks that: ? Are high in calories. ? Have added sugar. ? Are high in sodium. ? Have saturated fats or trans fats.  Limit how much you eat of the following foods: ? Prepackaged meals. ? Fast food. ? Fried foods. ? Processed meat, such as bacon, sausage, and deli meats. ? Fatty cuts of red meat and poultry with skin.  Cook foods in healthier ways, such as by baking, broiling, or grilling.  When grocery shopping, try to shop around the outside of the store. This helps you buy mostly fresh foods and avoid canned and prepackaged foods.  What lifestyle changes can be made?  Exercise at least 30 minutes 5 or more days each week. Exercising includes brisk walking, yard work, biking, running, swimming, and team sports like basketball and soccer. Ask your health care provider which exercises are safe for you.  Do not use any products that contain nicotine or tobacco, such as cigarettes and e-cigarettes. If you need help quitting, ask your health care provider.  Limit alcohol intake to no more than 1 drink a day for nonpregnant women and 2 drinks a day  for men. One drink equals 12 oz of beer, 5 oz of wine, or 1 oz of hard liquor.  Try to get 7-9 hours of sleep each night. What other changes can be made?  Keep a food and activity journal to keep track of: ? What you ate and how many calories you had. Remember to count sauces, dressings, and side dishes. ? Whether you were active, and what exercises you did. ? Your calorie, weight, and activity goals.  Check your weight regularly. Track any changes. If you notice you have gained weight, make changes to your diet or  activity routine.  Avoid taking weight-loss medicines or supplements. Talk to your health care provider before starting any new medicine or supplement.  Talk to your health care provider before trying any new diet or exercise plan. Why are these changes important? Eating healthy, staying active, and having healthy habits not only help prevent obesity, they also:  Help you to manage stress and emotions.  Help you to connect with friends and family.  Improve your self-esteem.  Improve your sleep.  Prevent long-term health problems.  What can happen if changes are not made? Being obese or overweight can cause you to develop joint or bone problems, which can make it hard for you to stay active or do activities you enjoy. Being obese or overweight also puts stress on your heart and lungs and can lead to health problems like diabetes, heart disease, and some cancers. Where to find more information: Talk with your health care provider or a dietitian about healthy eating and healthy lifestyle choices. You may also find other information through these resources:  U.[place]. Department of Agriculture MyPlate: FormerBoss.no  American Heart Association: www.heart.org  Centers for Disease Control and Prevention: http://www.wolf.info/  Summary  Staying at a healthy weight is important. It helps prevent certain diseases and health problems, such as heart disease, diabetes, joint problems, sleep disorders, and some cancers.  Being obese or overweight can cause you to develop joint or bone problems, which can make it hard for you to stay active or do activities you enjoy.  You can prevent unhealthy weight gain by eating a healthy diet, exercising regularly, not smoking, limiting alcohol, and getting enough sleep.  Talk with your health care provider or a dietitian for guidance about healthy eating and healthy lifestyle choices. This information is not intended to replace advice given to you by your  health care provider. Make sure you discuss any questions you have with your health care provider. Document Released: 09/08/2016 Document Revised: 10/14/2016 Document Reviewed: 10/14/2016 Elsevier Interactive Patient Education  [employer].

## 2018-04-15 ENCOUNTER — Encounter: Payer: Federal, State, Local not specified - PPO | Admitting: Vascular Surgery

## 2018-04-15 ENCOUNTER — Ambulatory Visit (HOSPITAL_COMMUNITY)
Admission: RE | Admit: 2018-04-15 | Discharge: 2018-04-15 | Disposition: A | Discharge status: Home / Self Care | Payer: Federal, State, Local not specified - PPO | Source: Ambulatory Visit | Attending: Vascular Surgery | Admitting: Vascular Surgery

## 2018-04-15 ENCOUNTER — Ambulatory Visit (INDEPENDENT_AMBULATORY_CARE_PROVIDER_SITE_OTHER): Payer: Federal, State, Local not specified - PPO | Admitting: Vascular Surgery

## 2018-04-15 ENCOUNTER — Other Ambulatory Visit: Payer: Self-pay

## 2018-04-15 ENCOUNTER — Encounter: Payer: Self-pay | Admitting: Vascular Surgery

## 2018-04-15 VITALS — BP 124/80 | HR 73 | Resp 18 | Ht 61.5 in | Wt 148.0 lb

## 2018-04-15 DIAGNOSIS — M7989 Other specified soft tissue disorders: Secondary | ICD-10-CM | POA: Insufficient documentation

## 2018-04-15 DIAGNOSIS — I82413 Acute embolism and thrombosis of femoral vein, bilateral: Secondary | ICD-10-CM

## 2018-04-15 DIAGNOSIS — Z95828 Presence of other vascular implants and grafts: Secondary | ICD-10-CM | POA: Insufficient documentation

## 2018-04-15 DIAGNOSIS — I82432 Acute embolism and thrombosis of left popliteal vein: Secondary | ICD-10-CM | POA: Diagnosis not present

## 2018-04-15 DIAGNOSIS — I872 Venous insufficiency (chronic) (peripheral): Secondary | ICD-10-CM

## 2018-04-15 NOTE — Progress Notes (Signed)
Patient ID: April Vega, female   DOB: 25-Nov-1970, 47 y.o.   MRN: [id number]  Reason for Consult: Follow-up (4-6 wk f/u )   Referred by Donella Stade, PA-C  Subjective:     HPI:  April Vega is a 47 y.o. 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  She subsequently followed up with right lower extremity swelling and had recurrent swelling of her left.  She is now undergone stenting on the right side and left side.  She is religiously wearing her compression stockings.  She takes aspirin and Xarelto.  She remains active as a Pharmacist, hospital and walks frequently.  She had a IVC iliac duplex prior to today's visit.  Past Medical History:  Diagnosis Date  . ADHD (attention deficit hyperactivity disorder)   . Anemia   . DVT (deep venous thrombosis) ([place])   . 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 Menometrorrhagia 01/02/2016   Family History  Problem Relation Age of Onset  . Diabetes Mother   . Hypertension Mother    Past Surgical History:  Procedure Laterality Date  . bone graph  01/1990  . CESAREAN SECTION    . INTRAVASCULAR ULTRASOUND/IVUS Left 05/12/2017   Procedure: Intravascular Ultrasound/IVUS;  Surgeon: Waynetta Sandy, MD;  Location: [place] [facility];  Service: Cardiovascular;  Laterality: Left;  IVC TO LT POPLITEAL VEIN  . INTRAVASCULAR ULTRASOUND/IVUS N/A 03/07/2018   Procedure: INTRAVASCULAR ULTRASOUND/IVUS;  Surgeon: Waynetta Sandy, MD;  Location: [place] [facility];  Service: Cardiovascular;  Laterality: N/A;  . LOWER EXTREMITY VENOGRAPHY Bilateral 05/11/2017   Procedure: Bilateral Lower Extremity Venography;  Surgeon: Serafina Mitchell, MD;  Location: [place] [facility];  Service: Cardiovascular;  Laterality: Bilateral;  . LOWER EXTREMITY VENOGRAPHY N/A 03/07/2018   Procedure: LOWER EXTREMITY VENOGRAPHY;  Surgeon: Waynetta Sandy, MD;  Location: [place] [facility];  Service: Cardiovascular;  Laterality: N/A;  .  PERIPHERAL VASCULAR BALLOON ANGIOPLASTY Left 03/07/2018   Procedure: PERIPHERAL VASCULAR BALLOON ANGIOPLASTY;  Surgeon: Waynetta Sandy, MD;  Location: [place] [facility];  Service: Cardiovascular;  Laterality: Left;  left common iliac vein  . PERIPHERAL VASCULAR INTERVENTION Left 05/12/2017   Procedure: PERIPHERAL VASCULAR INTERVENTION;  Surgeon: Waynetta Sandy, MD;  Location: [place] [facility];  Service: Cardiovascular;  Laterality: Left;  IVC TO LT COMMON FEM VEIN  STENT  . PERIPHERAL VASCULAR INTERVENTION Right 03/07/2018   Procedure: PERIPHERAL VASCULAR INTERVENTION;  Surgeon: Waynetta Sandy, MD;  Location: [place] [facility];  Service: Cardiovascular;  Laterality: Right;  right common iliac vein  . PERIPHERAL VASCULAR THROMBECTOMY Left 05/11/2017   Procedure: PERIPHERAL VASCULAR THROMBECTOMY;  Surgeon: Serafina Mitchell, MD;  Location: [place] [facility];  Service: Cardiovascular;  Laterality: Left;  left lower extremity venous    Short Social History:  Social History   Tobacco Use  . Smoking status: Never Smoker  . Smokeless tobacco: Never Used  Substance Use Topics  . Alcohol use: No    Alcohol/week: 0.0 oz    Allergies  Allergen Reactions  . Wellbutrin [Bupropion] Other (See Comments)    Temporary memory loss  . Latex Rash and Swelling    Current Outpatient Medications  Medication Sig Dispense Refill  . aspirin 81 MG tablet Take by mouth.    . Iron-FA-B Cmp-C-Biot-Probiotic (FUSION PLUS) CAPS Take 1 capsule by mouth daily. (Patient taking differently: Take 1 capsule by mouth at bedtime. ) 90 capsule 3  . montelukast (SINGULAIR) 10 MG tablet  TAKE 1 TABLET (10 MG TOTAL) BY MOUTH AT BEDTIME. 90 tablet 0  . rivaroxaban (XARELTO) 20 MG TABS tablet Take 1 tablet (20 mg total) by mouth daily with supper. 90 tablet 2  . sertraline (ZOLOFT) 25 MG tablet Take 0.5 tablets (12.5 mg total) by mouth daily for 3 days, THEN 1 tablet (25 mg total) daily for 3  days, THEN 2 tablets (50 mg total) daily for 24 days. (Patient not taking: No sig reported) 60 tablet 0   No current facility-administered medications for this visit.     Review of Systems  Constitutional:  Constitutional negative. HENT: HENT negative.  Eyes: Eyes negative.  Respiratory: Respiratory negative.  Cardiovascular: Cardiovascular negative. Negative for leg swelling.  GI: Gastrointestinal negative.  Musculoskeletal: Musculoskeletal negative.  Skin: Skin negative.  Neurological: Neurological negative. Hematologic: Hematologic/lymphatic negative.  Psychiatric: Psychiatric negative.        Objective:  Objective   Vitals:   04/15/18 0904  BP: 124/80  Pulse: 73  Resp: 18  SpO2: 100%  Weight: 148 lb (67.1 kg)  Height: 5' 1.5" (1.562 m)   Body mass index is 27.51 kg/m.  Physical Exam  Constitutional: She is oriented to person, place, and time. She appears well-developed.  HENT:  Head: Normocephalic.  Eyes: Pupils are equal, round, and reactive to light.  Neck: Normal range of motion.  Cardiovascular: Normal rate.  Pulses:      Radial pulses are 2+ on the right side, and 2+ on the left side.  Abdominal: Soft. She exhibits no mass.  Musculoskeletal:  Thigh-high compression stockings in place  Neurological: She is alert and oriented to person, place, and time.  Skin: Skin is warm and dry.  Psychiatric: She has a normal mood and affect. Her behavior is normal. Judgment and thought content normal.    Data: I have independently interpreted her IVC iliac duplex which demonstrates patent stents bilaterally.     Assessment/Plan:     47 year old female follows up after right common iliac vein stenting and has a history of stenting of the left common and external iliac veins this was recently balloon angioplasty as well.  States that now her swelling has resolved.  We had previously discussed reflux testing of her bilateral lower extremities of her swelling persisted  and she is in thigh-high compression stockings religiously at this point.  She does have some bleeding issues from Xarelto and we will have her follow-up in 3 months with repeat IVC iliac duplex.  If at that time she has persistent bleeding and her stents are patent we will discontinue Xarelto and have lifelong aspirin.  If there are issues before then we will certainly see her sooner.     Waynetta Sandy MD [facility]

## 2018-04-21 ENCOUNTER — Other Ambulatory Visit: Payer: Self-pay

## 2018-04-21 DIAGNOSIS — I82413 Acute embolism and thrombosis of femoral vein, bilateral: Secondary | ICD-10-CM

## 2018-04-21 DIAGNOSIS — I872 Venous insufficiency (chronic) (peripheral): Secondary | ICD-10-CM

## 2018-04-22 ENCOUNTER — Other Ambulatory Visit: Payer: Federal, State, Local not specified - PPO

## 2018-04-22 ENCOUNTER — Ambulatory Visit: Payer: Federal, State, Local not specified - PPO | Admitting: Family

## 2018-04-22 DIAGNOSIS — Z130 Encounter for screening for diseases of the blood and blood-forming organs and certain disorders involving the immune mechanism: Secondary | ICD-10-CM | POA: Diagnosis not present

## 2018-04-22 DIAGNOSIS — Z09 Encounter for follow-up examination after completed treatment for conditions other than malignant neoplasm: Secondary | ICD-10-CM | POA: Diagnosis not present

## 2018-04-25 ENCOUNTER — Other Ambulatory Visit: Payer: Self-pay

## 2018-04-25 ENCOUNTER — Telehealth: Payer: Self-pay | Admitting: Family

## 2018-04-25 ENCOUNTER — Inpatient Hospital Stay (HOSPITAL_BASED_OUTPATIENT_CLINIC_OR_DEPARTMENT_OTHER): Payer: Federal, State, Local not specified - PPO | Admitting: Family

## 2018-04-25 ENCOUNTER — Inpatient Hospital Stay: Payer: Federal, State, Local not specified - PPO | Attending: Hematology & Oncology

## 2018-04-25 ENCOUNTER — Encounter: Payer: Self-pay | Admitting: Family

## 2018-04-25 VITALS — BP 127/75 | HR 68 | Temp 98.2°F | Resp 18 | Wt 144.0 lb

## 2018-04-25 DIAGNOSIS — N920 Excessive and frequent menstruation with regular cycle: Secondary | ICD-10-CM | POA: Insufficient documentation

## 2018-04-25 DIAGNOSIS — R5383 Other fatigue: Secondary | ICD-10-CM | POA: Diagnosis not present

## 2018-04-25 DIAGNOSIS — D500 Iron deficiency anemia secondary to blood loss (chronic): Secondary | ICD-10-CM

## 2018-04-25 DIAGNOSIS — D509 Iron deficiency anemia, unspecified: Secondary | ICD-10-CM

## 2018-04-25 LAB — CBC WITH DIFFERENTIAL (CANCER CENTER ONLY)
BASOS PCT: 2 %
Basophils Absolute: 0.1 10*3/uL (ref 0.0–0.1)
EOS ABS: 0.4 10*3/uL (ref 0.0–0.5)
EOS PCT: 7 %
HCT: 43.5 % (ref 34.8–46.6)
Hemoglobin: 13.9 g/dL (ref 11.6–15.9)
LYMPHS PCT: 23 %
Lymphs Abs: 1.2 10*3/uL (ref 0.9–3.3)
MCH: 28 pg (ref 26.0–34.0)
MCHC: 32 g/dL (ref 32.0–36.0)
MCV: 87.5 fL (ref 81.0–101.0)
MONO ABS: 0.6 10*3/uL (ref 0.1–0.9)
Monocytes Relative: 12 %
Neutro Abs: 2.8 10*3/uL (ref 1.5–6.5)
Neutrophils Relative %: 56 %
PLATELETS: 441 10*3/uL — AB (ref 145–400)
RBC: 4.97 MIL/uL (ref 3.70–5.32)
RDW: 17.6 % — AB (ref 11.1–15.7)
WBC: 5 10*3/uL (ref 3.9–10.0)

## 2018-04-25 LAB — CMP (CANCER CENTER ONLY)
ALT: 13 U/L (ref 0–44)
AST: 16 U/L (ref 15–41)
Albumin: 4.1 g/dL (ref 3.5–5.0)
Alkaline Phosphatase: 50 U/L (ref 38–126)
Anion gap: 10 (ref 5–15)
BUN: 8 mg/dL (ref 6–20)
CHLORIDE: 105 mmol/L (ref 98–111)
CO2: 25 mmol/L (ref 22–32)
CREATININE: 0.82 mg/dL (ref 0.44–1.00)
Calcium: 9.5 mg/dL (ref 8.9–10.3)
GFR, Est AFR Am: >60 mL/min (ref >60)
GFR, Estimated: >60 mL/min (ref >60)
Glucose, Bld: 76 mg/dL (ref 70–99)
POTASSIUM: 4.3 mmol/L (ref 3.5–5.1)
SODIUM: 140 mmol/L (ref 135–145)
Total Bilirubin: 0.3 mg/dL (ref 0.3–1.2)
Total Protein: 7.6 g/dL (ref 6.5–8.1)

## 2018-04-25 NOTE — Telephone Encounter (Signed)
Appointments scheduled Letter/Calendar mailed to patient per 8/5 los

## 2018-04-25 NOTE — Telephone Encounter (Signed)
Appointments scheduled Letter/Calendar mailed per 8/5 los

## 2018-04-25 NOTE — Progress Notes (Signed)
Hematology and Oncology Follow Up Visit  April Vega [id number] Jun 10, 1971 47 y.o. 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 She had to have a uterine ablation on 6/26 due to persistent bleeding even after D&C. Her cycle in July was only light spotting for 4 days.  She has had no other bleeding, no bruising or petechiae.  She is symptomatic with fatigue. No fever, chills, n/v, cough, rash, dizziness, SOB, chest pain, palpitations, abdominal pain or changes in bowel or bladder habits.  She follows up with vascular Dr. Donzetta Matters regarding her Xarelto.  No swelling, tenderness, numbness or tingling in her extremities.  No lymphadenopathy noted on exam.  Her appetite comes and goes. She is staying hydrated. Her weight is stable.  She plans to follow-up with her PCP regarding her thyroid and TSH.   ECOG Performance Status: 1 - Symptomatic but completely ambulatory  Medications:  Allergies as of 04/25/2018      Reactions   Wellbutrin [bupropion] Other (See Comments)   Temporary memory loss   Latex Rash, Swelling      Medication List        Accurate as of 04/25/18 11:40 AM. Always use your most recent med list.          aspirin 81 MG tablet Take by mouth.   FUSION PLUS Caps Take 1 capsule by mouth daily.   montelukast 10 MG tablet Commonly known as:  SINGULAIR TAKE 1 TABLET (10 MG TOTAL) BY MOUTH AT BEDTIME.   rivaroxaban 20 MG Tabs tablet Commonly known as:  XARELTO Take 1 tablet (20 mg total) by mouth daily with supper.       Allergies:  Allergies  Allergen Reactions  . Wellbutrin [Bupropion] Other (See Comments)    Temporary memory loss  . Latex Rash and Swelling    Past Medical History, Surgical history, Social history, and Family History were reviewed and updated.  Review of  Systems: All other 10 point review of systems is negative.   Physical Exam:  weight is 144 lb (65.3 kg). Her temperature is 98.2 F (36.8 C). Her blood pressure is 127/75 and her pulse is 68. Her respiration is 18 and oxygen saturation is 99%.   Wt Readings from Last 3 Encounters:  04/25/18 144 lb (65.3 kg)  04/15/18 148 lb (67.1 kg)  04/11/18 146 lb (66.2 kg)    Ocular: Sclerae unicteric, pupils equal, round and reactive to light Ear-nose-throat: Oropharynx clear, dentition fair Lymphatic: No cervical, supraclavicular or axillary adenopathy Lungs no rales or rhonchi, good excursion bilaterally Heart regular rate and rhythm, no murmur appreciated Abd soft, nontender, positive bowel sounds, no liver or spleen tip palpated on exam, no fluid wave  MSK no focal spinal tenderness, no joint edema Neuro: non-focal, well-oriented, appropriate affect Breasts: Deferred   Lab Results  Component Value Date   WBC 5.0 04/25/2018   HGB 13.9 04/25/2018   HCT 43.5 04/25/2018   MCV 87.5 04/25/2018   PLT 441 (H) 04/25/2018   Lab Results  Component Value Date   FERRITIN 15 03/11/2018   IRON 12 (L) 03/11/2018   TIBC 315 03/11/2018   UIBC 303 03/11/2018   IRONPCTSAT 4 (L) 03/11/2018   Lab Results  Component Value Date   RBC 4.97 04/25/2018   No results found for: KPAFRELGTCHN, LAMBDASER,  KAPLAMBRATIO No results found for: IGGSERUM, IGA, IGMSERUM No results found for: Odetta Pink, SPEI   Chemistry      Component Value Date/Time   NA 144 03/11/2018 1038   NA 140 01/02/2016 0928   K 4.0 03/11/2018 1038   K 3.8 01/02/2016 0928   CL 110 (H) 03/11/2018 1038   CO2 24 03/11/2018 1038   CO2 23 01/02/2016 0928   BUN 9 03/11/2018 1038   BUN 6.8 (L) 01/02/2016 0928   CREATININE 1.00 03/11/2018 1038   CREATININE 0.69 09/01/2017 1140   CREATININE 0.7 01/02/2016 0928      Component Value Date/Time   CALCIUM 9.1 03/11/2018 1038    CALCIUM 9.6 01/02/2016 0928   ALKPHOS 41 03/11/2018 1038   ALKPHOS 39 (L) 01/02/2016 0928   AST 16 03/11/2018 1038   AST 14 01/02/2016 0928   ALT 16 03/11/2018 1038   ALT 10 01/02/2016 0928   BILITOT 0.6 03/11/2018 1038   BILITOT 0.32 01/02/2016 0928      Impression and Plan: April Vega is a pleasant 47 yo African American female with iron deficiency anemia secondary to heavy cycles. She had a uterine ablation on 6/26 and her cycle in July was only light spotting for 4 days. She is having some persistent fatigue.  She received 2 doses of IV iron in June. We will see what her iron studies show and bring her back in for another infusion if needed.  We will go ahead and plan to see her back in another 6-8 weeks for follow-up.  She will contact our office with any questions or concerns. We can certainly see her sooner if need be.   Laverna Peace, NP 8/5/201911:40 AM

## 2018-04-26 LAB — IRON AND TIBC
Iron: 66 ug/dL (ref 41–142)
SATURATION RATIOS: 30 % (ref 21–57)
TIBC: 218 ug/dL — AB (ref 236–444)
UIBC: 152 ug/dL

## 2018-04-26 LAB — FERRITIN: Ferritin: 135 ng/mL (ref 11–307)

## 2018-05-02 ENCOUNTER — Encounter: Payer: Self-pay | Admitting: Physician Assistant

## 2018-05-02 ENCOUNTER — Ambulatory Visit (INDEPENDENT_AMBULATORY_CARE_PROVIDER_SITE_OTHER): Payer: Federal, State, Local not specified - PPO | Admitting: Physician Assistant

## 2018-05-02 VITALS — BP 113/78 | HR 69 | Ht 61.5 in | Wt 144.0 lb

## 2018-05-02 DIAGNOSIS — R5383 Other fatigue: Secondary | ICD-10-CM | POA: Diagnosis not present

## 2018-05-02 DIAGNOSIS — E559 Vitamin D deficiency, unspecified: Secondary | ICD-10-CM

## 2018-05-02 DIAGNOSIS — F411 Generalized anxiety disorder: Secondary | ICD-10-CM

## 2018-05-02 DIAGNOSIS — F329 Major depressive disorder, single episode, unspecified: Secondary | ICD-10-CM

## 2018-05-02 DIAGNOSIS — R4589 Other symptoms and signs involving emotional state: Secondary | ICD-10-CM

## 2018-05-02 DIAGNOSIS — R635 Abnormal weight gain: Secondary | ICD-10-CM | POA: Diagnosis not present

## 2018-05-02 DIAGNOSIS — N926 Irregular menstruation, unspecified: Secondary | ICD-10-CM

## 2018-05-02 DIAGNOSIS — Z131 Encounter for screening for diabetes mellitus: Secondary | ICD-10-CM | POA: Diagnosis not present
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
## 2018-05-03 ENCOUNTER — Telehealth: Payer: Self-pay

## 2018-05-03 ENCOUNTER — Encounter: Payer: Self-pay | Admitting: Physician Assistant

## 2018-05-03 DIAGNOSIS — R5383 Other fatigue: Secondary | ICD-10-CM | POA: Insufficient documentation

## 2018-05-03 DIAGNOSIS — N926 Irregular menstruation, unspecified: Secondary | ICD-10-CM | POA: Insufficient documentation

## 2018-05-03 NOTE — Telephone Encounter (Signed)
[facility] called and stated they did not draw patient because of an open bill.

## 2018-05-04 DIAGNOSIS — R635 Abnormal weight gain: Secondary | ICD-10-CM | POA: Diagnosis not present

## 2018-05-10 DIAGNOSIS — N926 Irregular menstruation, unspecified: Secondary | ICD-10-CM | POA: Diagnosis not present

## 2018-05-10 DIAGNOSIS — R635 Abnormal weight gain: Secondary | ICD-10-CM | POA: Diagnosis not present

## 2018-05-10 DIAGNOSIS — R5383 Other fatigue: Secondary | ICD-10-CM | POA: Diagnosis not present

## 2018-05-10 DIAGNOSIS — Z131 Encounter for screening for diabetes mellitus: Secondary | ICD-10-CM | POA: Diagnosis not present

## 2018-05-11 ENCOUNTER — Encounter (HOSPITAL_BASED_OUTPATIENT_CLINIC_OR_DEPARTMENT_OTHER): Payer: Self-pay

## 2018-05-11 ENCOUNTER — Ambulatory Visit (HOSPITAL_BASED_OUTPATIENT_CLINIC_OR_DEPARTMENT_OTHER): Admit: 2018-05-11 | Payer: Federal, State, Local not specified - PPO | Admitting: Obstetrics & Gynecology

## 2018-05-11 LAB — COMPLETE METABOLIC PANEL WITH GFR
AG RATIO: 1.6 (calc) (ref 1.0–2.5)
ALKALINE PHOSPHATASE (APISO): 49 U/L (ref 33–115)
ALT: 13 U/L (ref 6–29)
AST: 16 U/L (ref 10–35)
Albumin: 4.2 g/dL (ref 3.6–5.1)
BUN: 9 mg/dL (ref 7–25)
CO2: 24 mmol/L (ref 20–32)
CREATININE: 0.72 mg/dL (ref 0.50–1.10)
Calcium: 9.3 mg/dL (ref 8.6–10.2)
Chloride: 105 mmol/L (ref 98–110)
GFR, Est African American: 114 mL/min/{1.73_m2} (ref >=60)
GFR, Est Non African American: 98 mL/min/{1.73_m2} (ref >=60)
GLOBULIN: 2.6 g/dL (ref 1.9–3.7)
Glucose, Bld: 73 mg/dL (ref 65–99)
POTASSIUM: 4.4 mmol/L (ref 3.5–5.3)
SODIUM: 138 mmol/L (ref 135–146)
Total Bilirubin: 0.2 mg/dL (ref 0.2–1.2)
Total Protein: 6.8 g/dL (ref 6.1–8.1)

## 2018-05-11 LAB — HEMOGLOBIN A1C
HEMOGLOBIN A1C: 5.2 %{Hb} (ref <5.7)
Mean Plasma Glucose: 103 (calc)
eAG (mmol/L): 5.7 (calc)

## 2018-05-11 LAB — FSH/LH
FSH: 3.3 m[IU]/mL
LH: 2.8 m[IU]/mL

## 2018-05-11 LAB — TSH+FREE T4: TSH W/REFLEX TO FT4: 0.76 m[IU]/L

## 2018-05-11 SURGERY — ABLATION, ENDOMETRIUM
Anesthesia: Choice

## 2018-05-12 ENCOUNTER — Other Ambulatory Visit: Payer: Self-pay | Admitting: Physician Assistant

## 2018-05-15 NOTE — Progress Notes (Signed)
Call pt: you are NOT in menopause. A!C is normal. Thyroid is normal range but more toward the HYPER active thyroid side. We could certainly watch this and recheck in 2-3 months and make sure not trending down.

## 2018-05-26 ENCOUNTER — Encounter: Payer: Self-pay | Admitting: Physician Assistant

## 2018-06-06 ENCOUNTER — Other Ambulatory Visit: Payer: Self-pay | Admitting: Physician Assistant

## 2018-06-06 MED ORDER — SERTRALINE HCL 50 MG PO TABS: 50.0000 mg | ORAL_TABLET | Freq: Every day | ORAL | 1 refills | Status: DC

## 2018-06-08 ENCOUNTER — Other Ambulatory Visit: Payer: Self-pay | Admitting: Physician Assistant

## 2018-06-13 ENCOUNTER — Other Ambulatory Visit: Payer: Federal, State, Local not specified - PPO

## 2018-06-13 ENCOUNTER — Ambulatory Visit: Payer: Federal, State, Local not specified - PPO | Admitting: Family

## 2018-06-24 ENCOUNTER — Inpatient Hospital Stay: Payer: Federal, State, Local not specified - PPO | Attending: Hematology & Oncology

## 2018-06-24 ENCOUNTER — Other Ambulatory Visit: Payer: Self-pay | Admitting: Physician Assistant

## 2018-06-24 ENCOUNTER — Encounter: Payer: Self-pay | Admitting: Vascular Surgery

## 2018-06-24 ENCOUNTER — Ambulatory Visit (INDEPENDENT_AMBULATORY_CARE_PROVIDER_SITE_OTHER): Payer: Federal, State, Local not specified - PPO | Admitting: Vascular Surgery

## 2018-06-24 ENCOUNTER — Ambulatory Visit (HOSPITAL_COMMUNITY)
Admission: RE | Admit: 2018-06-24 | Discharge: 2018-06-24 | Disposition: A | Discharge status: Home / Self Care | Payer: Federal, State, Local not specified - PPO | Source: Ambulatory Visit | Attending: Vascular Surgery | Admitting: Vascular Surgery

## 2018-06-24 VITALS — BP 125/86 | HR 70 | Temp 97.9°F | Resp 14 | Wt 139.0 lb

## 2018-06-24 DIAGNOSIS — D509 Iron deficiency anemia, unspecified: Secondary | ICD-10-CM | POA: Insufficient documentation

## 2018-06-24 DIAGNOSIS — I82413 Acute embolism and thrombosis of femoral vein, bilateral: Secondary | ICD-10-CM

## 2018-06-24 DIAGNOSIS — Z95828 Presence of other vascular implants and grafts: Secondary | ICD-10-CM | POA: Diagnosis not present

## 2018-06-24 DIAGNOSIS — N920 Excessive and frequent menstruation with regular cycle: Secondary | ICD-10-CM | POA: Diagnosis not present

## 2018-06-24 DIAGNOSIS — I872 Venous insufficiency (chronic) (peripheral): Secondary | ICD-10-CM

## 2018-06-24 DIAGNOSIS — D500 Iron deficiency anemia secondary to blood loss (chronic): Secondary | ICD-10-CM

## 2018-06-24 LAB — CBC WITH DIFFERENTIAL (CANCER CENTER ONLY)
Basophils Absolute: 0.1 10*3/uL (ref 0.0–0.1)
Basophils Relative: 2 %
EOS ABS: 0.4 10*3/uL (ref 0.0–0.5)
Eosinophils Relative: 8 %
HEMATOCRIT: 42.6 % (ref 34.8–46.6)
HEMOGLOBIN: 13.7 g/dL (ref 11.6–15.9)
LYMPHS ABS: 1.3 10*3/uL (ref 0.9–3.3)
LYMPHS PCT: 28 %
MCH: 27.6 pg (ref 26.0–34.0)
MCHC: 32.2 g/dL (ref 32.0–36.0)
MCV: 85.7 fL (ref 81.0–101.0)
MONOS PCT: 11 %
Monocytes Absolute: 0.5 10*3/uL (ref 0.1–0.9)
NEUTROS ABS: 2.5 10*3/uL (ref 1.5–6.5)
NEUTROS PCT: 51 %
Platelet Count: 390 10*3/uL (ref 145–400)
RBC: 4.97 MIL/uL (ref 3.70–5.32)
RDW: 15.7 % (ref 11.1–15.7)
WBC: 4.8 10*3/uL (ref 3.9–10.0)

## 2018-06-24 LAB — CMP (CANCER CENTER ONLY)
ALT: 15 U/L (ref 0–44)
AST: 18 U/L (ref 15–41)
Albumin: 4.1 g/dL (ref 3.5–5.0)
Alkaline Phosphatase: 51 U/L (ref 38–126)
Anion gap: 9 (ref 5–15)
BUN: 6 mg/dL (ref 6–20)
CHLORIDE: 107 mmol/L (ref 98–111)
CO2: 27 mmol/L (ref 22–32)
CREATININE: 0.75 mg/dL (ref 0.44–1.00)
Calcium: 10 mg/dL (ref 8.9–10.3)
GFR, Est AFR Am: >60 mL/min (ref >60)
GFR, Estimated: >60 mL/min (ref >60)
GLUCOSE: 81 mg/dL (ref 70–99)
Potassium: 4.2 mmol/L (ref 3.5–5.1)
SODIUM: 143 mmol/L (ref 135–145)
Total Bilirubin: 0.4 mg/dL (ref 0.3–1.2)
Total Protein: 7.6 g/dL (ref 6.5–8.1)

## 2018-06-24 LAB — FERRITIN: FERRITIN: 156 ng/mL (ref 11–307)

## 2018-06-24 LAB — IRON AND TIBC
Iron: 71 ug/dL (ref 41–142)
Saturation Ratios: 35 % (ref 21–57)
TIBC: 205 ug/dL — ABNORMAL LOW (ref 236–444)
UIBC: 134 ug/dL

## 2018-06-24 MED ORDER — RIVAROXABAN 20 MG PO TABS: 20.0000 mg | ORAL_TABLET | Freq: Every day | ORAL | 3 refills | Status: DC

## 2018-06-24 NOTE — Progress Notes (Signed)
Patient ID: April Vega, female   DOB: 1971-09-08, 47 y.o.   MRN: [id number]  Reason for Consult: Follow-up (Pt c/o pain in LLE x 1 months, no falls or injury noted but she is walking 1 hour per day on Treadmill)   Referred by Donella Stade, PA-C  Subjective:     HPI:  April Vega is a 47 y.o. female with a history of bilateral iliac vein stents most recently on the right with an angioplasty of the existing left.  She remains on Xarelto and baby aspirin daily.  Swelling is all resolved.  She is religiously wearing compression stockings still.  She does walk for an hour on the treadmill but after 10 minutes she is getting left knee pain and is been concerned about her stents.  She previously was having bleeding issues but is now undergone ablation and is not having this at this time.  She does need a refill of her Xarelto today.  Past Medical History:  Diagnosis Date  . ADHD (attention deficit hyperactivity disorder)   . Anemia   . DVT (deep venous thrombosis) ([place])   . 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 Menometrorrhagia 01/02/2016   Family History  Problem Relation Age of Onset  . Diabetes Mother   . Hypertension Mother    Past Surgical History:  Procedure Laterality Date  . bone graph  01/1990  . CESAREAN SECTION    . INTRAVASCULAR ULTRASOUND/IVUS Left 05/12/2017   Procedure: Intravascular Ultrasound/IVUS;  Surgeon: Waynetta Sandy, MD;  Location: [place] [facility];  Service: Cardiovascular;  Laterality: Left;  IVC TO LT POPLITEAL VEIN  . INTRAVASCULAR ULTRASOUND/IVUS N/A 03/07/2018   Procedure: INTRAVASCULAR ULTRASOUND/IVUS;  Surgeon: Waynetta Sandy, MD;  Location: [place] [facility];  Service: Cardiovascular;  Laterality: N/A;  . LOWER EXTREMITY VENOGRAPHY Bilateral 05/11/2017   Procedure: Bilateral Lower Extremity Venography;  Surgeon: Serafina Mitchell, MD;  Location: [place] [facility];  Service: Cardiovascular;  Laterality: Bilateral;  . LOWER  EXTREMITY VENOGRAPHY N/A 03/07/2018   Procedure: LOWER EXTREMITY VENOGRAPHY;  Surgeon: Waynetta Sandy, MD;  Location: [place] [facility];  Service: Cardiovascular;  Laterality: N/A;  . PERIPHERAL VASCULAR BALLOON ANGIOPLASTY Left 03/07/2018   Procedure: PERIPHERAL VASCULAR BALLOON ANGIOPLASTY;  Surgeon: Waynetta Sandy, MD;  Location: [place] [facility];  Service: Cardiovascular;  Laterality: Left;  left common iliac vein  . PERIPHERAL VASCULAR INTERVENTION Left 05/12/2017   Procedure: PERIPHERAL VASCULAR INTERVENTION;  Surgeon: Waynetta Sandy, MD;  Location: [place] [facility];  Service: Cardiovascular;  Laterality: Left;  IVC TO LT COMMON FEM VEIN  STENT  . PERIPHERAL VASCULAR INTERVENTION Right 03/07/2018   Procedure: PERIPHERAL VASCULAR INTERVENTION;  Surgeon: Waynetta Sandy, MD;  Location: [place] [facility];  Service: Cardiovascular;  Laterality: Right;  right common iliac vein  . PERIPHERAL VASCULAR THROMBECTOMY Left 05/11/2017   Procedure: PERIPHERAL VASCULAR THROMBECTOMY;  Surgeon: Serafina Mitchell, MD;  Location: [place] [facility];  Service: Cardiovascular;  Laterality: Left;  left lower extremity venous    Short Social History:  Social History   Tobacco Use  . Smoking status: Never Smoker  . Smokeless tobacco: Never Used  Substance Use Topics  . Alcohol use: No    Alcohol/week: 0.0 standard drinks    Allergies  Allergen Reactions  . Wellbutrin [Bupropion] Other (See Comments)    Temporary memory loss  . Latex Rash and Swelling    Current Outpatient Medications  Medication Sig Dispense Refill  .  aspirin 81 MG tablet Take by mouth.    . Iron-FA-B Cmp-C-Biot-Probiotic (FUSION PLUS) CAPS Take 1 capsule by mouth daily. (Patient taking differently: Take 1 capsule by mouth at bedtime. ) 90 capsule 3  . montelukast (SINGULAIR) 10 MG tablet TAKE 1 TABLET (10 MG TOTAL) BY MOUTH AT BEDTIME. 90 tablet 0  . rivaroxaban (XARELTO) 20 MG TABS  tablet Take 1 tablet (20 mg total) by mouth daily with supper. 90 tablet 2  . sertraline (ZOLOFT) 25 MG tablet Take 1 tablet (25 mg total) by mouth 2 (two) times daily. (Patient not taking: Reported on 06/24/2018) 60 tablet 0  . sertraline (ZOLOFT) 50 MG tablet Take 1 tablet (50 mg total) by mouth daily. (Patient not taking: Reported on 06/24/2018) 90 tablet 1   No current facility-administered medications for this visit.     Review of Systems  Constitutional:  Constitutional negative. HENT: HENT negative.  Eyes: Eyes negative.  Respiratory: Respiratory negative.  Cardiovascular: Cardiovascular negative.  Musculoskeletal: Positive for leg pain and joint pain.  Skin: Skin negative.  Neurological: Neurological negative. Hematologic: Hematologic/lymphatic negative.  Psychiatric: Psychiatric negative.        Objective:  Objective   Vitals:   06/24/18 0843  BP: 125/86  Pulse: 70  Resp: 14  Temp: 97.9 F (36.6 C)  TempSrc: Oral  Weight: 139 lb (63 kg)   Body mass index is 25.84 kg/m.  Physical Exam  Constitutional: She is oriented to person, place, and time. She appears well-developed.  HENT:  Head: Normocephalic.  Eyes: Pupils are equal, round, and reactive to light.  Neck: Normal range of motion.  Cardiovascular: Normal rate.  Pulses:      Dorsalis pedis pulses are 2+ on the right side, and 2+ on the left side.  Pulmonary/Chest: Effort normal.  Abdominal: Soft.  Musculoskeletal: Normal range of motion. She exhibits deformity. She exhibits no edema.  Neurological: She is alert and oriented to person, place, and time.  Psychiatric: She has a normal mood and affect. Her behavior is normal. Judgment and thought content normal.    Data: I have independently interpreted her IVC iliac duplex today which demonstrates patent bilateral iliac vein stents     Assessment/Plan:     47 year old female with history of bilateral iliac vein stents which are patent today.  She does  have knee pain which occurs with walking after 10 minutes on a treadmill but she is able to do this.  She does not have any further leg swelling.  At this time I think most of her issues are.  We will also refill her Xarelto and she will follow-up in 4 months with IVC iliac duplex again.  If her stents are patent at that time we can transition her to an antiplatelet only and away from [place].  She demonstrates good understanding.     Waynetta Sandy MD [facility]

## 2018-06-28 ENCOUNTER — Other Ambulatory Visit: Payer: Self-pay

## 2018-06-28 DIAGNOSIS — I82432 Acute embolism and thrombosis of left popliteal vein: Secondary | ICD-10-CM

## 2018-06-28 DIAGNOSIS — I872 Venous insufficiency (chronic) (peripheral): Secondary | ICD-10-CM

## 2018-06-28 DIAGNOSIS — I82413 Acute embolism and thrombosis of femoral vein, bilateral: Secondary | ICD-10-CM

## 2018-06-29 DIAGNOSIS — N6002 Solitary cyst of left breast: Secondary | ICD-10-CM | POA: Diagnosis not present

## 2018-06-29 DIAGNOSIS — R928 Other abnormal and inconclusive findings on diagnostic imaging of breast: Secondary | ICD-10-CM | POA: Diagnosis not present

## 2018-08-01 ENCOUNTER — Encounter: Payer: Self-pay | Admitting: Physician Assistant

## 2018-08-01 ENCOUNTER — Ambulatory Visit (INDEPENDENT_AMBULATORY_CARE_PROVIDER_SITE_OTHER): Payer: Federal, State, Local not specified - PPO

## 2018-08-01 ENCOUNTER — Ambulatory Visit: Payer: Federal, State, Local not specified - PPO | Admitting: Physician Assistant

## 2018-08-01 ENCOUNTER — Ambulatory Visit (INDEPENDENT_AMBULATORY_CARE_PROVIDER_SITE_OTHER): Payer: Federal, State, Local not specified - PPO | Admitting: Physician Assistant

## 2018-08-01 ENCOUNTER — Encounter: Payer: Self-pay | Admitting: Sports Medicine

## 2018-08-01 ENCOUNTER — Telehealth: Payer: Self-pay | Admitting: Physician Assistant

## 2018-08-01 ENCOUNTER — Ambulatory Visit (INDEPENDENT_AMBULATORY_CARE_PROVIDER_SITE_OTHER): Payer: Federal, State, Local not specified - PPO | Admitting: Sports Medicine

## 2018-08-01 VITALS — BP 117/79 | HR 77 | Ht 61.5 in | Wt 140.0 lb

## 2018-08-01 DIAGNOSIS — M25561 Pain in right knee: Secondary | ICD-10-CM

## 2018-08-01 DIAGNOSIS — M1712 Unilateral primary osteoarthritis, left knee: Secondary | ICD-10-CM

## 2018-08-01 DIAGNOSIS — M7542 Impingement syndrome of left shoulder: Secondary | ICD-10-CM

## 2018-08-01 DIAGNOSIS — M25512 Pain in left shoulder: Secondary | ICD-10-CM | POA: Insufficient documentation

## 2018-08-01 DIAGNOSIS — E663 Overweight: Secondary | ICD-10-CM

## 2018-08-01 DIAGNOSIS — M25562 Pain in left knee: Secondary | ICD-10-CM | POA: Diagnosis not present

## 2018-08-01 MED ORDER — DICLOFENAC SODIUM 1 % TD GEL: 4.0000 g | Freq: Four times a day (QID) | TRANSDERMAL | 1 refills | Status: DC

## 2018-08-01 NOTE — Patient Instructions (Signed)

## 2018-08-01 NOTE — Progress Notes (Signed)
Call pt: no acute findings on xray of shoulder. Keep with treatment plan in office.
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
## 2018-08-01 NOTE — Progress Notes (Signed)
Subjective:    I'm seeing this patient as a consultation for: Iran Planas, PA-C  CC: Left knee pain, left shoulder pain  HPI: Left knee: Painful under the kneecap, worse going up and down stairs, moderate, persistent without radiation, mechanical symptoms, no recent trauma.  Has tried oral NSAIDs without any improvement.  Left shoulder pain: Localized over the deltoid, worse with overhead activities, waking her from sleep.  I reviewed the past medical history, family history, social history, surgical history, and allergies today and no changes were needed.  Please see the problem list section below in epic for further details.  Past Medical History: Past Medical History:  Diagnosis Date  . ADHD (attention deficit hyperactivity disorder)   . Anemia   . DVT (deep venous thrombosis) ([place])   . 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 Menometrorrhagia 01/02/2016   Past Surgical History: Past Surgical History:  Procedure Laterality Date  . bone graph  01/1990  . CESAREAN SECTION    . INTRAVASCULAR ULTRASOUND/IVUS Left 05/12/2017   Procedure: Intravascular Ultrasound/IVUS;  Surgeon: Waynetta Sandy, MD;  Location: [facility];  Service: Cardiovascular;  Laterality: Left;  IVC TO LT POPLITEAL VEIN  . INTRAVASCULAR ULTRASOUND/IVUS N/A 03/07/2018   Procedure: INTRAVASCULAR ULTRASOUND/IVUS;  Surgeon: Waynetta Sandy, MD;  Location: [facility];  Service: Cardiovascular;  Laterality: N/A;  . LOWER EXTREMITY VENOGRAPHY Bilateral 05/11/2017   Procedure: Bilateral Lower Extremity Venography;  Surgeon: Serafina Mitchell, MD;  Location: [facility];  Service: Cardiovascular;  Laterality: Bilateral;  . LOWER EXTREMITY VENOGRAPHY N/A 03/07/2018   Procedure: LOWER EXTREMITY VENOGRAPHY;  Surgeon: Waynetta Sandy, MD;  Location: [facility];  Service: Cardiovascular;  Laterality: N/A;  . PERIPHERAL VASCULAR BALLOON ANGIOPLASTY Left 03/07/2018   Procedure: PERIPHERAL VASCULAR BALLOON ANGIOPLASTY;  Surgeon: Waynetta Sandy, MD;  Location: [facility];  Service: Cardiovascular;  Laterality: Left;  left common iliac vein  . PERIPHERAL VASCULAR INTERVENTION Left 05/12/2017   Procedure: PERIPHERAL VASCULAR INTERVENTION;  Surgeon: Waynetta Sandy, MD;  Location: [place] [facility];  Service: Cardiovascular;  Laterality: Left;  IVC TO LT COMMON FEM VEIN  STENT  . PERIPHERAL VASCULAR INTERVENTION Right 03/07/2018   Procedure: PERIPHERAL VASCULAR INTERVENTION;  Surgeon: Waynetta Sandy, MD;  Location: [facility];  Service: Cardiovascular;  Laterality: Right;  right common iliac vein  . PERIPHERAL VASCULAR THROMBECTOMY Left 05/11/2017   Procedure: PERIPHERAL VASCULAR THROMBECTOMY;  Surgeon: Serafina Mitchell, MD;  Location: [facility];  Service: Cardiovascular;  Laterality: Left;  left lower extremity venous   Social History: Social History   Socioeconomic History  . Marital status: Married    Spouse name: Not on file  . Number of children: Not on file  . Years of education: Not on file  . Highest education level: Not on file  Occupational History  . Occupation: Pharmacist, hospital  Social Needs  . Financial resource strain: Not on file  . Food insecurity:    Worry: Not on file    Inability: Not on file  . Transportation needs:    Medical: Not on file    Non-medical: Not on file  Tobacco Use  . Smoking status: Never Smoker  . Smokeless tobacco: Never Used  Substance and Sexual Activity  . Alcohol use: No    Alcohol/week: 0.0 standard drinks  . Drug use: No  . Sexual activity: Yes    Partners: Male    Comment: Occ use condoms  Lifestyle  . Physical activity:    Days per week: Not on file    Minutes per session: Not on file  . Stress: Not on file  Relationships  . Social connections:    Talks on phone: Not on file    Gets together: Not on file    Attends religious service: Not on file     Active member of club or organization: Not on file    Attends meetings of clubs or organizations: Not on file    Relationship status: Not on file  Other Topics Concern  . Not on file  Social History Narrative  . Not on file   Family History: Family History  Problem Relation Age of Onset  . Diabetes Mother   . Hypertension Mother    Allergies: Allergies  Allergen Reactions  . Wellbutrin [Bupropion] Other (See Comments)    Temporary memory loss  . Latex Rash and Swelling   Medications: See med rec.  Review of Systems: No headache, visual changes, nausea, vomiting, diarrhea, constipation, dizziness, abdominal pain, skin rash, fevers, chills, night sweats, weight loss, swollen lymph nodes, body aches, joint swelling, muscle aches, chest pain, shortness of breath, mood changes, visual or auditory hallucinations.   Objective:   General: Well Developed, well nourished, and in no acute distress.  Neuro:  Extra-ocular muscles intact, able to move all 4 extremities, sensation grossly intact.  Deep tendon reflexes tested were normal. Psych: Alert and oriented, mood congruent with affect. ENT:  Ears and nose appear unremarkable.  Hearing grossly normal. Neck: Unremarkable overall appearance, trachea midline.  No visible thyroid enlargement. Eyes: Conjunctivae and lids appear unremarkable.  Pupils equal and round. Skin: Warm and dry, no rashes noted.  Cardiovascular: Pulses palpable, no extremity edema. Left knee: Normal to inspection with no erythema or effusion or obvious bony abnormalities. Tender to palpation of the patellar facets ROM normal in flexion and extension and lower leg rotation. Ligaments with solid consistent endpoints including ACL, PCL, LCL, MCL. Negative Mcmurray's and provocative meniscal tests. Non painful patellar compression. Patellar and quadriceps tendons unremarkable. Hamstring and quadriceps strength is normal. Left shoulder: Inspection reveals no  abnormalities, atrophy or asymmetry. Palpation is normal with no tenderness over AC joint or bicipital groove. ROM is full in all planes. Rotator cuff strength normal throughout. Positive Neer and Hawkin's tests, empty can. Speeds and Yergason's tests normal. No labral pathology noted with negative Obrien's, negative crank, negative clunk, and good stability. Normal scapular function observed. No painful arc and no drop arm sign. No apprehension sign.  Procedure: Real-time Ultrasound Guided Injection of left subacromial bursa Device: GE Logiq E  Verbal informed consent obtained.  Time-out conducted.  Noted no overlying erythema, induration, or other signs of local infection.  Skin prepped in a sterile fashion.  Local anesthesia: Topical Ethyl chloride.  With sterile technique and under real time ultrasound guidance: 1 cc Kenalog 40, 1 cc lidocaine, 1 cc bupivacaine injected easily Completed without difficulty  Pain immediately resolved suggesting accurate placement of the medication.  Advised to call if fevers/chills, erythema, induration, drainage, or persistent bleeding.  Images permanently stored and available for review in the ultrasound unit.  Impression: Technically successful ultrasound guided injection.  Procedure: Real-time Ultrasound Guided Injection of left knee Device: GE Logiq E  Verbal informed consent obtained.  Time-out conducted.  Noted no overlying erythema, induration, or other signs of local infection.  Skin prepped in a sterile fashion.  Local anesthesia: Topical Ethyl chloride.  With sterile technique  and under real time ultrasound guidance: 1 cc kenalog 40, 2 cc lidocaine, 2 cc bupivacaine injected easily Completed without difficulty  Pain immediately resolved suggesting accurate placement of the medication.  Advised to call if fevers/chills, erythema, induration, drainage, or persistent bleeding.  Images permanently stored and available for review in the  ultrasound unit.  Impression: Technically successful ultrasound guided injection.  Impression and Recommendations:   This case required medical decision making of moderate complexity.  Osteoarthritis of left patellofemoral joint Injection as above. Patellofemoral rehab, x-rays.  Impingement syndrome, shoulder, left Injection as above, subacromial. Rehab exercises given. Return in 1 month. ___________________________________________ Gwen Her. Dianah Field, M.D., ABFM., CAQSM. Primary Care and Sports Medicine [place] [facility]  Adjunct Professor of [place] of [facility] [facility] [employer] of Medicine

## 2018-08-01 NOTE — Assessment & Plan Note (Signed)
Injection as above. Patellofemoral rehab, x-rays.

## 2018-08-01 NOTE — Assessment & Plan Note (Signed)
Injection as above, subacromial. Rehab exercises given. Return in 1 month.

## 2018-08-01 NOTE — Telephone Encounter (Signed)
Mammogram at [facility]

## 2018-08-01 NOTE — Telephone Encounter (Signed)
Done. Request has been sent.

## 2018-09-08 ENCOUNTER — Encounter: Payer: Self-pay | Admitting: Physician Assistant

## 2018-09-19 ENCOUNTER — Other Ambulatory Visit: Payer: Self-pay | Admitting: Physician Assistant

## 2018-09-19 ENCOUNTER — Ambulatory Visit (INDEPENDENT_AMBULATORY_CARE_PROVIDER_SITE_OTHER): Payer: Federal, State, Local not specified - PPO | Admitting: Sports Medicine

## 2018-09-19 ENCOUNTER — Encounter: Payer: Self-pay | Admitting: Sports Medicine

## 2018-09-19 DIAGNOSIS — M2170 Unequal limb length (acquired), unspecified site: Secondary | ICD-10-CM

## 2018-09-19 DIAGNOSIS — M7542 Impingement syndrome of left shoulder: Secondary | ICD-10-CM

## 2018-09-19 DIAGNOSIS — M25512 Pain in left shoulder: Secondary | ICD-10-CM

## 2018-09-19 DIAGNOSIS — M1712 Unilateral primary osteoarthritis, left knee: Secondary | ICD-10-CM

## 2018-09-19 DIAGNOSIS — G8929 Other chronic pain: Secondary | ICD-10-CM

## 2018-09-19 DIAGNOSIS — R590 Localized enlarged lymph nodes: Secondary | ICD-10-CM

## 2018-09-19 NOTE — Assessment & Plan Note (Signed)
Severe leg length discrepancy, left shorter than right. Custom orthotic today with aggressive left sided lift.

## 2018-09-19 NOTE — Assessment & Plan Note (Signed)
Resolved after injection 

## 2018-09-19 NOTE — Progress Notes (Addendum)
    Patient was fitted for a : standard, cushioned, semi-rigid orthotic. The orthotic was heated and afterward the patient stood on the orthotic blank positioned on the orthotic stand. The patient was positioned in subtalar neutral position and 10 degrees of ankle dorsiflexion in a weight bearing stance. After completion of molding, a stable base was applied to the orthotic blank. The blank was ground to a stable position for weight bearing. Size: 8 Base: White Health and safety inspector and Padding: 3 layers of white EVA under the left orthotic The patient ambulated these, and they were very comfortable.  I spent 40 minutes with this patient, greater than 50% was face-to-face time counseling regarding the below diagnosis.  Impingement syndrome, shoulder, left Improved after injection, persistent pain, adding an MRI.  Osteoarthritis of left patellofemoral joint Resolved after injection.  Acquired leg length discrepancy Severe leg length discrepancy, left shorter than right. Custom orthotic today with aggressive left sided lift.  Left Axillary lymphadenopathy There are a few, likely benign and reactive lymph nodes in the axilla.  There is some OA in the [facility] joint as well.  Because of the nodes I would like her to have a diagnostic mammo/us of the left breast.   ___________________________________________ Gwen Her. Dianah Field, M.D., ABFM., CAQSM. Primary Care and [place] Instructor of [place] of [facility] of Medicine

## 2018-09-19 NOTE — Assessment & Plan Note (Signed)
Improved after injection, persistent pain, adding an MRI.

## 2018-10-10 ENCOUNTER — Ambulatory Visit (INDEPENDENT_AMBULATORY_CARE_PROVIDER_SITE_OTHER): Payer: Federal, State, Local not specified - PPO

## 2018-10-10 DIAGNOSIS — M7542 Impingement syndrome of left shoulder: Secondary | ICD-10-CM

## 2018-10-10 DIAGNOSIS — M25512 Pain in left shoulder: Secondary | ICD-10-CM

## 2018-10-10 DIAGNOSIS — G8929 Other chronic pain: Secondary | ICD-10-CM | POA: Diagnosis not present

## 2018-10-10 DIAGNOSIS — R590 Localized enlarged lymph nodes: Secondary | ICD-10-CM | POA: Insufficient documentation

## 2018-10-10 DIAGNOSIS — M7502 Adhesive capsulitis of left shoulder: Secondary | ICD-10-CM | POA: Diagnosis not present

## 2018-10-10 NOTE — Addendum Note (Signed)
Addended by: Silverio Decamp on: 10/10/2018 06:19 PM   Modules accepted: Orders

## 2018-10-10 NOTE — Assessment & Plan Note (Signed)
There are a few, likely benign and reactive lymph nodes in the axilla.  There is some OA in the [facility] joint as well.  Because of the nodes I would like her to have a diagnostic mammo/us of the left breast.

## 2018-10-13 ENCOUNTER — Encounter: Payer: Self-pay | Admitting: Physician Assistant

## 2018-10-13 ENCOUNTER — Other Ambulatory Visit: Payer: Self-pay | Admitting: Physician Assistant

## 2018-10-13 ENCOUNTER — Ambulatory Visit (INDEPENDENT_AMBULATORY_CARE_PROVIDER_SITE_OTHER): Payer: Federal, State, Local not specified - PPO | Admitting: Physician Assistant

## 2018-10-13 VITALS — BP 123/72 | HR 80 | Ht 61.5 in | Wt 139.0 lb

## 2018-10-13 DIAGNOSIS — M94261 Chondromalacia, right knee: Secondary | ICD-10-CM | POA: Insufficient documentation

## 2018-10-13 DIAGNOSIS — E663 Overweight: Secondary | ICD-10-CM

## 2018-10-13 DIAGNOSIS — R590 Localized enlarged lymph nodes: Secondary | ICD-10-CM

## 2018-10-13 DIAGNOSIS — M7542 Impingement syndrome of left shoulder: Secondary | ICD-10-CM | POA: Diagnosis not present

## 2018-10-13 DIAGNOSIS — M25561 Pain in right knee: Secondary | ICD-10-CM | POA: Diagnosis not present

## 2018-10-13 NOTE — Progress Notes (Unsigned)
Needs u/s..... kvile imaging.

## 2018-10-13 NOTE — Progress Notes (Signed)
Subjective:    I'm seeing this patient as a consultation for: Iran Planas, PA-C  CC: Right knee pain  HPI: April Vega, she was here to see Iran Planas, PA-C.  She was having some right knee pain.  I am called for further evaluation and definitive treatment.  Pain has been present for 2 weeks, localized anteriorly over the medial femoral condyle.  No mechanical symptoms, no trauma.  Moderate, persistent without radiation.  We did a subacromial injection on the left not too long ago, she had a partial response.  Ultimately we obtained an MRI the results of which will be dictated below.  She is requesting a repeat injection today with double the dose of steroid.  I reviewed the past medical history, family history, social history, surgical history, and allergies today and no changes were needed.  Please see the problem list section below in epic for further details.  Past Medical History: Past Medical History:  Diagnosis Date  . ADHD (attention deficit hyperactivity disorder)   . Anemia   . DVT (deep venous thrombosis) ([place])   . 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 Menometrorrhagia 01/02/2016   Past Surgical History: Past Surgical History:  Procedure Laterality Date  . bone graph  01/1990  . CESAREAN SECTION    . INTRAVASCULAR ULTRASOUND/IVUS Left 05/12/2017   Procedure: Intravascular Ultrasound/IVUS;  Surgeon: Waynetta Sandy, MD;  Location: [place] [facility];  Service: Cardiovascular;  Laterality: Left;  IVC TO LT POPLITEAL VEIN  . INTRAVASCULAR ULTRASOUND/IVUS N/A 03/07/2018   Procedure: INTRAVASCULAR ULTRASOUND/IVUS;  Surgeon: Waynetta Sandy, MD;  Location: [place] [facility];  Service: Cardiovascular;  Laterality: N/A;  . LOWER EXTREMITY VENOGRAPHY Bilateral 05/11/2017   Procedure: Bilateral Lower Extremity Venography;  Surgeon: Serafina Mitchell, MD;  Location: [place] [facility];  Service: Cardiovascular;  Laterality: Bilateral;  . LOWER EXTREMITY  VENOGRAPHY N/A 03/07/2018   Procedure: LOWER EXTREMITY VENOGRAPHY;  Surgeon: Waynetta Sandy, MD;  Location: [place] [facility];  Service: Cardiovascular;  Laterality: N/A;  . PERIPHERAL VASCULAR BALLOON ANGIOPLASTY Left 03/07/2018   Procedure: PERIPHERAL VASCULAR BALLOON ANGIOPLASTY;  Surgeon: Waynetta Sandy, MD;  Location: [place] [facility];  Service: Cardiovascular;  Laterality: Left;  left common iliac vein  . PERIPHERAL VASCULAR INTERVENTION Left 05/12/2017   Procedure: PERIPHERAL VASCULAR INTERVENTION;  Surgeon: Waynetta Sandy, MD;  Location: [place] [facility];  Service: Cardiovascular;  Laterality: Left;  IVC TO LT COMMON FEM VEIN  STENT  . PERIPHERAL VASCULAR INTERVENTION Right 03/07/2018   Procedure: PERIPHERAL VASCULAR INTERVENTION;  Surgeon: Waynetta Sandy, MD;  Location: [place] [facility];  Service: Cardiovascular;  Laterality: Right;  right common iliac vein  . PERIPHERAL VASCULAR THROMBECTOMY Left 05/11/2017   Procedure: PERIPHERAL VASCULAR THROMBECTOMY;  Surgeon: Serafina Mitchell, MD;  Location: [place] [facility];  Service: Cardiovascular;  Laterality: Left;  left lower extremity venous   Social History: Social History   Socioeconomic History  . Marital status: Married    Spouse name: Not on file  . Number of children: Not on file  . Years of education: Not on file  . Highest education level: Not on file  Occupational History  . Occupation: Pharmacist, hospital  Social Needs  . Financial resource strain: Not on file  . Food insecurity:    Worry: Not on file    Inability: Not on file  . Transportation needs:    Medical: Not on file    Non-medical: Not on file  Tobacco Use  . Smoking status: Never Smoker  . Smokeless tobacco: Never Used  Substance and Sexual Activity  . Alcohol use: No    Alcohol/week: 0.0 standard drinks  . Drug use: No  . Sexual activity: Yes    Partners: Male    Comment: Occ use condoms  Lifestyle  . Physical  activity:    Days per week: Not on file    Minutes per session: Not on file  . Stress: Not on file  Relationships  . Social connections:    Talks on phone: Not on file    Gets together: Not on file    Attends religious service: Not on file    Active member of club or organization: Not on file    Attends meetings of clubs or organizations: Not on file    Relationship status: Not on file  Other Topics Concern  . Not on file  Social History Narrative  . Not on file   Family History: Family History  Problem Relation Age of Onset  . Diabetes Mother   . Hypertension Mother    Allergies: Allergies  Allergen Reactions  . Wellbutrin [Bupropion] Other (See Comments)    Temporary memory loss  . Latex Rash and Swelling   Medications: See med rec.  Review of Systems: No headache, visual changes, nausea, vomiting, diarrhea, constipation, dizziness, abdominal pain, skin rash, fevers, chills, night sweats, weight loss, swollen lymph nodes, body aches, joint swelling, muscle aches, chest pain, shortness of breath, mood changes, visual or auditory hallucinations.   Objective:   General: Well Developed, well nourished, and in no acute distress.  Neuro:  Extra-ocular muscles intact, able to move all 4 extremities, sensation grossly intact.  Deep tendon reflexes tested were normal. Psych: Alert and oriented, mood congruent with affect. ENT:  Ears and nose appear unremarkable.  Hearing grossly normal. Neck: Unremarkable overall appearance, trachea midline.  No visible thyroid enlargement. Eyes: Conjunctivae and lids appear unremarkable.  Pupils equal and round. Skin: Warm and dry, no rashes noted.  Cardiovascular: Pulses palpable, no extremity edema. Right knee: Normal to inspection with no erythema or effusion or obvious bony abnormalities. Tender to palpation over the anterior aspect of the medial femoral condyle. ROM normal in flexion and extension and lower leg rotation. Ligaments with  solid consistent endpoints including ACL, PCL, LCL, MCL. Negative Mcmurray's and provocative meniscal tests. Non painful patellar compression. Patellar and quadriceps tendons unremarkable. Hamstring and quadriceps strength is normal.  Procedure: Real-time Ultrasound Guided Injection of left subacromial bursa Device: GE Logiq E  Verbal informed consent obtained.  Time-out conducted.  Noted no overlying erythema, induration, or other signs of local infection.  Skin prepped in a sterile fashion.  Local anesthesia: Topical Ethyl chloride.  With sterile technique and under real time ultrasound guidance: 2 cc Kenalog 40, 2 cc lidocaine injected easily Completed without difficulty  Pain immediately resolved suggesting accurate placement of the medication.  Advised to call if fevers/chills, erythema, induration, drainage, or persistent bleeding.  Images permanently stored and available for review in the ultrasound unit.  Impression: Technically successful ultrasound guided injection.  Shoulder MRI shows acromioclavicular osteoarthritis, rotator cuff tendinosis, there were some reactive appearing lymph nodes, mammogram has been ordered.  Impression and Recommendations:   This case required medical decision making of moderate complexity.  Impingement syndrome, shoulder, left Injection, this time double dose steroid per patient request. If persistent discomfort in 1 month we will proceed with arthroscopy.  Chondromalacia, knee, right Overall unremarkable x-rays.  Treated conservatively with topical Pennsaid, unable to use oral NSAIDs due to anticoagulation. Rehab exercises given. If insufficient relief in 2 to 4 weeks we will proceed with MRI and injection. ___________________________________________ Gwen Her. Dianah Field, M.D., ABFM., CAQSM. Primary Care and Sports Medicine  [facility]  Adjunct Professor of [place] of [employer] of  Medicine

## 2018-10-13 NOTE — Assessment & Plan Note (Signed)
Injection, this time double dose steroid per patient request. If persistent discomfort in 1 month we will proceed with arthroscopy.
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
## 2018-10-13 NOTE — Assessment & Plan Note (Signed)
Overall unremarkable x-rays. Treated conservatively with topical Pennsaid, unable to use oral NSAIDs due to anticoagulation. Rehab exercises given. If insufficient relief in 2 to 4 weeks we will proceed with MRI and injection.

## 2018-10-13 NOTE — Patient Instructions (Signed)
Pes Anserine Bursitis    The pes anserine is an area on the inside of your knee, just below the joint, that is cushioned by a fluid-filled sac (bursa). Pes anserine bursitis is a condition that happens when the bursa gets swollen and irritated. The condition causes knee pain.  What are the causes?  This condition may be caused by:  · Making the same movement over and over.  · A direct hit (trauma) to the inside of the leg.  What increases the risk?  You are more likely to develop this condition if you:  · Are a runner.  · Play sports that involve a lot of running and quick side-to-side movements (cutting).  · Are an athlete who plays contact sports.  · Swim using an inward angle of the knee, such as with the breaststroke.  · Have tight hamstring muscles.  · Are a woman.  · Are overweight.  · Have flat feet.  · Have diabetes or osteoarthritis.  What are the signs or symptoms?  Symptoms of this condition include:  · Knee pain that gets better with rest and worse with activities like climbing stairs, walking, running, or getting in and out of a chair.  · Swelling.  · Warmth.  · Tenderness when pressing at the inside of the lower leg, just below the knee.  How is this diagnosed?  This condition may be diagnosed based on:  · Your symptoms.  · Your medical history.  · A physical exam.  ? During your physical exam, your health care provider will press on the tendon attachment to see if you feel pain.  ? Your health care provider may also check your hip and knee motion and strength.  · Tests to check for swelling and fluid buildup in the bursa and to look at muscles, bones, and tendons. These tests might include:  ? X-rays.  ? MRI.  ? Ultrasound.  How is this treated?  This condition may be treated by:  · Resting your knee. You may be told to raise (elevate) your knee while resting.  · Avoiding activities that cause pain.  · Icing the inside of your knee.  · Sleeping with a pillow between your knees. This will cushion your  injured knee.  · Taking medicine by mouth (orally) to reduce pain and swelling or having medicine injected into your knee.  · Doing strengthening and stretching exercises (physical therapy).  If these treatments do not work or if the condition keeps coming back, you may need to have surgery to remove the bursa.  Follow these instructions at home:  Managing pain, stiffness, and swelling    · If directed, put ice on the injured area.  ? Put ice in a plastic bag.  ? Place a towel between your skin and the bag.  ? Leave the ice on for 20 minutes, 2-3 times a day.  · Elevate the injured area above the level of your heart while you are sitting or lying down.  Activity  · Return to your normal activities as told by your health care provider. Ask your health care provider what activities are safe for you.  · Do exercises as told by your health care provider.  General instructions  · Take over-the-counter and prescription medicines only as told by your health care provider.  · Sleep with a pillow between your knees.  · Do not use any products that contain nicotine or tobacco, such as cigarettes, e-cigarettes, and chewing tobacco. These   can delay healing. If you need help quitting, ask your health care provider.  · If you are overweight, work with your health care provider and a dietitian to set a weight-loss goal that is healthy and reasonable for you.  · Keep all follow-up visits as told by your health care provider. This is important.  How is this prevented?  · When exercising, make sure that you:  ? Warm up and stretch before being active.  ? Cool down and stretch after being active.  ? Give your body time to rest between periods of activity.  ? Use equipment that fits you.  ? Are safe and responsible while being active to avoid falls.  ? Do at least 150 minutes of moderate-intensity exercise each week, such as brisk walking or water aerobics.  ? Maintain physical fitness,  including:  § Strength.  § Flexibility.  § Cardiovascular fitness.  § Endurance.  ? Maintain a healthy weight.  Contact a health care provider if:  · Your symptoms do not improve.  · Your symptoms get worse.  Summary  · Pes anserine bursitis is a condition that happens when the fluid-filled sac (bursa) at the inside of your knee gets swollen and irritated. The condition causes knee pain.  · Treatment for pes anserine bursitis may include resting your knee, icing the inside of your knee, sleeping with a pillow between your knees, taking medicine by mouth or by injection, and doing strengthening and stretching exercises (physical therapy).  · Follow instructions for managing pain, stiffness, and swelling.  · Take over-the-counter and prescription medicines only as told by your health care provider.  This information is not intended to replace advice given to you by your health care provider. Make sure you discuss any questions you have with your health care provider.  Document Released: 09/07/2005 Document Revised: 02/16/2018 Document Reviewed: 02/16/2018  Elsevier Interactive Patient Education © 2019 [employer]

## 2018-10-17 ENCOUNTER — Telehealth: Payer: Self-pay | Admitting: Physician Assistant

## 2018-10-17 NOTE — Telephone Encounter (Signed)
Please abstract mammogram from [facility].

## 2018-10-28 ENCOUNTER — Encounter: Payer: Self-pay | Admitting: Vascular Surgery

## 2018-10-28 ENCOUNTER — Ambulatory Visit (HOSPITAL_COMMUNITY)
Admission: RE | Admit: 2018-10-28 | Discharge: 2018-10-28 | Disposition: A | Discharge status: Home / Self Care | Payer: Federal, State, Local not specified - PPO | Source: Ambulatory Visit | Attending: Vascular Surgery | Admitting: Vascular Surgery

## 2018-10-28 ENCOUNTER — Ambulatory Visit (INDEPENDENT_AMBULATORY_CARE_PROVIDER_SITE_OTHER): Payer: Federal, State, Local not specified - PPO | Admitting: Vascular Surgery

## 2018-10-28 VITALS — BP 120/84 | HR 69 | Temp 97.2°F | Resp 18 | Ht 61.5 in

## 2018-10-28 DIAGNOSIS — I82413 Acute embolism and thrombosis of femoral vein, bilateral: Secondary | ICD-10-CM | POA: Diagnosis not present

## 2018-10-28 DIAGNOSIS — I82432 Acute embolism and thrombosis of left popliteal vein: Secondary | ICD-10-CM | POA: Diagnosis not present

## 2018-10-28 DIAGNOSIS — Z48812 Encounter for surgical aftercare following surgery on the circulatory system: Secondary | ICD-10-CM

## 2018-10-28 DIAGNOSIS — I872 Venous insufficiency (chronic) (peripheral): Secondary | ICD-10-CM | POA: Diagnosis not present

## 2018-10-28 NOTE — Progress Notes (Signed)
Patient ID: April Vega, female   DOB: November 23, 1970, 48 y.o.   MRN: [id number]  Reason for Consult: Venous Insufficiency (4 mo f/u ivc iliac)   Referred by Donella Stade, PA-C  Subjective:     HPI:  April Vega is a 48 y.o. 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  At last visit she was having some thigh pain but this has now resolved.  States that she is wearing her compression stockings religiously.  Continues on Xarelto.  No further bleeding issues.  Past Medical History:  Diagnosis Date  . ADHD (attention deficit hyperactivity disorder)   . Anemia   . DVT (deep venous thrombosis) ([place])   . 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 Menometrorrhagia 01/02/2016   Family History  Problem Relation Age of Onset  . Diabetes Mother   . Hypertension Mother    Past Surgical History:  Procedure Laterality Date  . bone graph  01/1990  . CESAREAN SECTION    . INTRAVASCULAR ULTRASOUND/IVUS Left 05/12/2017   Procedure: Intravascular Ultrasound/IVUS;  Surgeon: Waynetta Sandy, MD;  Location: [facility];  Service: Cardiovascular;  Laterality: Left;  IVC TO LT POPLITEAL VEIN  . INTRAVASCULAR ULTRASOUND/IVUS N/A 03/07/2018   Procedure: INTRAVASCULAR ULTRASOUND/IVUS;  Surgeon: Waynetta Sandy, MD;  Location: [facility];  Service: Cardiovascular;  Laterality: N/A;  . LOWER EXTREMITY VENOGRAPHY Bilateral 05/11/2017   Procedure: Bilateral Lower Extremity Venography;  Surgeon: Serafina Mitchell, MD;  Location: [facility];  Service: Cardiovascular;  Laterality: Bilateral;  . LOWER EXTREMITY VENOGRAPHY N/A 03/07/2018   Procedure: LOWER EXTREMITY VENOGRAPHY;  Surgeon: Waynetta Sandy, MD;  Location: [facility];   Service: Cardiovascular;  Laterality: N/A;  . PERIPHERAL VASCULAR BALLOON ANGIOPLASTY Left 03/07/2018   Procedure: PERIPHERAL VASCULAR BALLOON ANGIOPLASTY;  Surgeon: Waynetta Sandy, MD;  Location: [facility];  Service: Cardiovascular;  Laterality: Left;  left common iliac vein  . PERIPHERAL VASCULAR INTERVENTION Left 05/12/2017   Procedure: PERIPHERAL VASCULAR INTERVENTION;  Surgeon: Waynetta Sandy, MD;  Location: [facility];  Service: Cardiovascular;  Laterality: Left;  IVC TO LT COMMON FEM VEIN  STENT  . PERIPHERAL VASCULAR INTERVENTION Right 03/07/2018   Procedure: PERIPHERAL VASCULAR INTERVENTION;  Surgeon: Waynetta Sandy, MD;  Location: [facility];  Service: Cardiovascular;  Laterality: Right;  right common iliac vein  . PERIPHERAL VASCULAR THROMBECTOMY Left 05/11/2017   Procedure: PERIPHERAL VASCULAR THROMBECTOMY;  Surgeon: Serafina Mitchell, MD;  Location: [facility];  Service: Cardiovascular;  Laterality: Left;  left lower extremity venous    Short Social History:  Social History   Tobacco Use  . Smoking status: Never Smoker  . Smokeless tobacco: Never Used  Substance Use Topics  . Alcohol use: No    Alcohol/week: 0.0 standard drinks    Allergies  Allergen Reactions  . Wellbutrin [Bupropion] Other (See Comments)    Temporary memory loss  . Latex Rash and Swelling    Current Outpatient Medications  Medication Sig Dispense Refill  . aspirin 81 MG tablet Take by mouth.    . diclofenac sodium (VOLTAREN) 1 % GEL Apply 4 g topically 4 (four) times daily. To affected joint. 100 g 1  . Iron-FA-B Cmp-C-Biot-Probiotic (FUSION PLUS) CAPS  1 CAPSULE A DAY.    . montelukast (SINGULAIR) 10 MG tablet TAKE 1 TABLET (10 MG TOTAL) BY MOUTH AT BEDTIME. 90 tablet 3  . rivaroxaban (XARELTO) 20 MG TABS tablet Take 1 tablet (20 mg total) by mouth daily with supper. 30 tablet 3  . Vitamin D, Ergocalciferol, (DRISDOL) 1.25 MG (50000 UT)  CAPS capsule TAKE ONE CAPSULE BY MOUTH EVERY 7 DAYS  1   No current facility-administered medications for this visit.     Review of Systems  Constitutional:  Constitutional negative. HENT: HENT negative.  Eyes: Eyes negative.  Respiratory: Respiratory negative.  Cardiovascular: Cardiovascular negative.  GI: Gastrointestinal negative.  Musculoskeletal: Musculoskeletal negative.  Skin: Skin negative.  Neurological: Neurological negative. Hematologic: Hematologic/lymphatic negative.  Psychiatric: Psychiatric negative.        Objective:  Objective   Vitals:   10/28/18 0927  BP: 120/84  Pulse: 69  Resp: 18  Temp: (!) 97.2 F (36.2 C)  TempSrc: Oral  SpO2: 100%  Height: 5' 1.5" (1.562 m)   Body mass index is 25.84 kg/m.  Physical Exam HENT:     Head: Normocephalic.  Eyes:     Pupils: Pupils are equal, round, and reactive to light.  Neck:     Musculoskeletal: Normal range of motion.  Cardiovascular:     Rate and Rhythm: Normal rate.  Pulmonary:     Effort: Pulmonary effort is normal.  Abdominal:     General: Abdomen is flat.     Palpations: Abdomen is soft.  Musculoskeletal: Normal range of motion.        General: No swelling.  Skin:    General: Skin is warm and dry.     Capillary Refill: Capillary refill takes less than 2 seconds.  Neurological:     General: No focal deficit present.     Mental Status: She is alert.  Psychiatric:        Mood and Affect: Mood normal.        Behavior: Behavior normal.        Thought Content: Thought content normal.        Judgment: Judgment normal.     Data: I have independently interpreted her IVC iliac duplex which demonstrates patent bilateral iliac vein stents     Assessment/Plan:     48 year old female follows up previous bilateral common iliac vein and left external leg vein stenting at separate times.  She now has no symptoms and stents are patent by duplex.  She will continue her Xarelto for another 2 months  given her current supply and then will transition to lifelong aspirin.  She will follow-up in 6 months with repeat studies unless she has issues prior we can see her sooner.  She demonstrates good understanding.     Waynetta Sandy MD [facility]

## 2018-11-03 ENCOUNTER — Telehealth: Payer: Self-pay

## 2018-11-03 NOTE — Telephone Encounter (Signed)
Patient called today wanting to see if we could switch her most recent mammogram referral to [facility] in [place]. This location is more convenient for patient. I will order if okay. Please advise. Thanks!

## 2018-11-04 NOTE — Telephone Encounter (Signed)
Ok to send u/s of breast/axilla order to [facility]

## 2018-11-07 NOTE — Telephone Encounter (Signed)
Left message for a return call. I need to know which location.

## 2018-11-13 ENCOUNTER — Other Ambulatory Visit: Payer: Self-pay | Admitting: Physician Assistant

## 2018-11-13 DIAGNOSIS — M25512 Pain in left shoulder: Secondary | ICD-10-CM

## 2018-11-14 NOTE — Telephone Encounter (Signed)
Requesting RX be sent for Probiotic  On med list, but not written by Lezlie Lye pended..please review

## 2018-11-17 ENCOUNTER — Other Ambulatory Visit: Payer: Federal, State, Local not specified - PPO

## 2018-11-21 ENCOUNTER — Other Ambulatory Visit: Payer: Federal, State, Local not specified - PPO

## 2018-11-22 ENCOUNTER — Ambulatory Visit
Admission: RE | Admit: 2018-11-22 | Discharge: 2018-11-22 | Disposition: A | Discharge status: Home / Self Care | Payer: Federal, State, Local not specified - PPO | Source: Ambulatory Visit | Attending: Sports Medicine | Admitting: Sports Medicine

## 2018-11-22 DIAGNOSIS — N6002 Solitary cyst of left breast: Secondary | ICD-10-CM | POA: Diagnosis not present

## 2018-11-22 DIAGNOSIS — R922 Inconclusive mammogram: Secondary | ICD-10-CM | POA: Diagnosis not present

## 2018-12-05 ENCOUNTER — Other Ambulatory Visit: Payer: Self-pay | Admitting: Physician Assistant

## 2018-12-06 NOTE — Telephone Encounter (Signed)
Attempted to call patient and was unable to get an answer. I sent a mychart message for the patient.

## 2018-12-06 NOTE — Telephone Encounter (Signed)
Can we call patient and let her know we got this request and wondering if she is taking this or not.

## 2018-12-06 NOTE — Telephone Encounter (Signed)
I do not see this on her current list. There is a note from 07/2018 that patient was not taking from last refill. Please advise.

## 2018-12-07 NOTE — Telephone Encounter (Signed)
Called patient and left a VM to call our office back if she is still taking the Zoloft. Patient has a follow up on 12/19/2018. She can discuss at that visit. Denied medication at this time.

## 2018-12-16 ENCOUNTER — Encounter: Payer: Self-pay | Admitting: Physician Assistant

## 2018-12-19 ENCOUNTER — Encounter: Payer: Federal, State, Local not specified - PPO | Admitting: Physician Assistant

## 2018-12-31 ENCOUNTER — Other Ambulatory Visit: Payer: Self-pay | Admitting: Physician Assistant

## 2018-12-31 DIAGNOSIS — M25512 Pain in left shoulder: Secondary | ICD-10-CM

## 2019-01-03 ENCOUNTER — Encounter: Payer: Self-pay | Admitting: Physician Assistant

## 2019-01-09 ENCOUNTER — Encounter: Payer: Self-pay | Admitting: Physician Assistant

## 2019-01-16 ENCOUNTER — Encounter: Payer: Federal, State, Local not specified - PPO | Admitting: Physician Assistant

## 2019-01-17 ENCOUNTER — Telehealth: Payer: Self-pay | Admitting: Neurology

## 2019-01-17 NOTE — Telephone Encounter (Signed)
Received note from [facility] stating they had been unable to contact patient about follow up breast imaging on patient after they performed exam on 06/29/18. They asked [place] to complete a form for follow up. They had patient listed as April Vega. All other demographic and DOB are correct. I wrote a note to the facility making them aware they had the wrong DOB on patient and sent follow up films that were performed to fax number [phone] with confirmation received.

## 2019-01-21 ENCOUNTER — Other Ambulatory Visit: Payer: Self-pay | Admitting: Physician Assistant

## 2019-01-30 ENCOUNTER — Ambulatory Visit (INDEPENDENT_AMBULATORY_CARE_PROVIDER_SITE_OTHER): Payer: Federal, State, Local not specified - PPO | Admitting: Physician Assistant

## 2019-01-30 ENCOUNTER — Encounter: Payer: Federal, State, Local not specified - PPO | Admitting: Physician Assistant

## 2019-01-30 ENCOUNTER — Encounter: Payer: Self-pay | Admitting: Physician Assistant

## 2019-01-30 VITALS — BP 129/68 | HR 72 | Temp 98.7°F | Ht 63.0 in | Wt 137.0 lb

## 2019-01-30 DIAGNOSIS — K219 Gastro-esophageal reflux disease without esophagitis: Secondary | ICD-10-CM | POA: Diagnosis not present

## 2019-01-30 DIAGNOSIS — Z13220 Encounter for screening for lipoid disorders: Secondary | ICD-10-CM | POA: Diagnosis not present

## 2019-01-30 DIAGNOSIS — K909 Intestinal malabsorption, unspecified: Secondary | ICD-10-CM | POA: Diagnosis not present

## 2019-01-30 DIAGNOSIS — E559 Vitamin D deficiency, unspecified: Secondary | ICD-10-CM | POA: Diagnosis not present

## 2019-01-30 DIAGNOSIS — Z0000 Encounter for general adult medical examination without abnormal findings: Secondary | ICD-10-CM

## 2019-01-30 DIAGNOSIS — Z131 Encounter for screening for diabetes mellitus: Secondary | ICD-10-CM | POA: Diagnosis not present

## 2019-01-30 DIAGNOSIS — Z1211 Encounter for screening for malignant neoplasm of colon: Secondary | ICD-10-CM

## 2019-01-30 DIAGNOSIS — R5383 Other fatigue: Secondary | ICD-10-CM

## 2019-01-30 DIAGNOSIS — M76891 Other specified enthesopathies of right lower limb, excluding foot: Secondary | ICD-10-CM

## 2019-01-30 MED ORDER — DICLOFENAC SODIUM 2 % TD SOLN: TRANSDERMAL | 2 refills | Status: DC

## 2019-01-30 MED ORDER — VITAMIN D (ERGOCALCIFEROL) 1.25 MG (50000 UNIT) PO CAPS: ORAL_CAPSULE | ORAL | 3 refills | Status: DC

## 2019-01-30 NOTE — Patient Instructions (Signed)

## 2019-01-30 NOTE — Progress Notes (Deleted)
Wants to discuss Cologuard and diabetes testing.

## 2019-01-31 ENCOUNTER — Telehealth: Payer: Self-pay | Admitting: Neurology

## 2019-01-31 ENCOUNTER — Encounter: Payer: Self-pay | Admitting: Physician Assistant

## 2019-01-31 ENCOUNTER — Other Ambulatory Visit: Payer: Self-pay | Admitting: Physician Assistant

## 2019-01-31 MED ORDER — DICLOFENAC SODIUM 2 % TD SOLN: TRANSDERMAL | 2 refills | Status: DC

## 2019-01-31 NOTE — Telephone Encounter (Signed)
I believe its [place]?

## 2019-01-31 NOTE — Telephone Encounter (Signed)
Cologuard order faxed to [phone] with confirmation received. They will contact the patient directly.   

## 2019-01-31 NOTE — Telephone Encounter (Signed)
Thanks sent

## 2019-01-31 NOTE — Telephone Encounter (Signed)
Correction - [facility] in [place] per Dr T

## 2019-01-31 NOTE — Telephone Encounter (Signed)
Do you remember the name of the speciality pharmacy that we send pennsaid too?   She tried voltaren and did not think it worked as well.

## 2019-01-31 NOTE — Telephone Encounter (Signed)
RX not covered by insurance. They are requesting alternative RX. Should we perform prior authorization or send alternative RX?

## 2019-02-01 ENCOUNTER — Encounter: Payer: Self-pay | Admitting: Physician Assistant

## 2019-02-01 ENCOUNTER — Encounter: Payer: Federal, State, Local not specified - PPO | Admitting: Physician Assistant

## 2019-02-01 MED ORDER — DICLOFENAC SODIUM 2 % TD SOLN: TRANSDERMAL | 2 refills | Status: DC

## 2019-02-01 NOTE — Progress Notes (Signed)
And lastly cholesterol still within goal for age and risk factors but increased a bit from 1 year ago. HDL GREAT. LDL increased. Will will recheck in a year. Watch fried fatty foods.

## 2019-02-01 NOTE — Telephone Encounter (Signed)
Received fax that there was missing information. Called [facility] and they stated it had to verify the number of pumps (one or two) to apply. RX rewritten to include pumps and resent.

## 2019-02-01 NOTE — Progress Notes (Signed)
Call pt: thyroid looks great. A!C good and stable. Hemoglobin and iron stores looks fantastic.NO anemia. Kidney, liver, glucose look great.   b12 looks great.   Vitamin d is in normal range but down from last recheck. Continue on vitamin d supplementation.   Platelets a little elevated. I would take a daily ASA 81mg .   Overall labs look fantastic. You are doing really well. Keep up the good work.   B6 still pending.

## 2019-02-01 NOTE — Progress Notes (Signed)
Subjective:     April Vega is a 48 y.o. female and is here for a comprehensive physical exam. The patient reports problems - she is having some right sided sharp pains in hip. no injury. no swelling.   Social History   Socioeconomic History  . Marital status: Married    Spouse name: Not on file  . Number of children: Not on file  . Years of education: Not on file  . Highest education level: Not on file  Occupational History  . Occupation: Pharmacist, hospital  Social Needs  . Financial resource strain: Not on file  . Food insecurity:    Worry: Not on file    Inability: Not on file  . Transportation needs:    Medical: Not on file    Non-medical: Not on file  Tobacco Use  . Smoking status: Never Smoker  . Smokeless tobacco: Never Used  Substance and Sexual Activity  . Alcohol use: No    Alcohol/week: 0.0 standard drinks  . Drug use: No  . Sexual activity: Yes    Partners: Male    Comment: Occ use condoms  Lifestyle  . Physical activity:    Days per week: Not on file    Minutes per session: Not on file  . Stress: Not on file  Relationships  . Social connections:    Talks on phone: Not on file    Gets together: Not on file    Attends religious service: Not on file    Active member of club or organization: Not on file    Attends meetings of clubs or organizations: Not on file    Relationship status: Not on file  . Intimate partner violence:    Fear of current or ex partner: Not on file    Emotionally abused: Not on file    Physically abused: Not on file    Forced sexual activity: Not on file  Other Topics Concern  . Not on file  Social History Narrative  . Not on file   Health Maintenance  Topic Date Due  . COLONOSCOPY  11/30/2018  . INFLUENZA VACCINE  04/22/2019  . MAMMOGRAM  05/25/2019  . TETANUS/TDAP  10/16/2020  . PAP SMEAR-Modifier  03/10/2023  . HIV Screening  Completed    The following portions of the patient's history were reviewed and updated as  appropriate: allergies, current medications, past family history, past medical history, past social history, past surgical history and problem list.  Review of Systems Pertinent items noted in HPI and remainder of comprehensive ROS otherwise negative.   Objective:    BP 129/68   Pulse 72   Temp 98.7 F (37.1 C) (Oral)   Ht 5\' 3"  (1.6 m)   Wt 137 lb (62.1 kg)   SpO2 100%   BMI 24.27 kg/m  General appearance: alert, cooperative and appears stated age Head: Normocephalic, without obvious abnormality, atraumatic Eyes: conjunctivae/corneas clear. PERRL, EOM's intact. Fundi benign. Ears: normal TM's and external ear canals both ears Nose: Nares normal. Septum midline. Mucosa normal. No drainage or sinus tenderness. Throat: lips, mucosa, and tongue normal; teeth and gums normal Neck: no adenopathy, no carotid bruit, no JVD, supple, symmetrical, trachea midline and thyroid not enlarged, symmetric, no tenderness/mass/nodules Back: symmetric, no curvature. ROM normal. No CVA tenderness. Lungs: clear to auscultation bilaterally Heart: regular rate and rhythm, S1, S2 normal, no murmur, click, rub or gallop Abdomen: soft, non-tender; bowel sounds normal; no masses,  no organomegaly Extremities: extremities normal, atraumatic, no cyanosis  or edema Pulses: 2+ and symmetric Skin: Skin color, texture, turgor normal. No rashes or lesions Lymph nodes: Cervical, supraclavicular, and axillary nodes normal. Neurologic: Alert and oriented X 3, normal strength and tone. Normal symmetric reflexes. Normal coordination and gait   normal ROM of both hips. No swelling. No tenderness.  Assessment:    Healthy female exam.      Plan:    [name] KitchenRaea was seen today for annual exam.  Diagnoses and all orders for this visit:  Routine physical examination -     TSH -     CBC with Differential/Platelet -     Hemoglobin A1c -     Ferritin -     B12 and Folate Panel -     VITAMIN D 25 Hydroxy (Vit-D  Deficiency, Fractures) -     Vitamin B6 -     COMPLETE METABOLIC PANEL WITH GFR -     Lipid Panel w/reflex Direct LDL  Colon cancer screening -     Cologuard  Gastroesophageal reflux disease, esophagitis presence not specified  Iron malabsorption -     CBC with Differential/Platelet -     Ferritin  Screening for diabetes mellitus -     Hemoglobin A1c -     COMPLETE METABOLIC PANEL WITH GFR  Screening for lipid disorders -     Lipid Panel w/reflex Direct LDL  Vitamin D deficiency -     VITAMIN D 25 Hydroxy (Vit-D Deficiency, Fractures) -     Vitamin D, Ergocalciferol, (DRISDOL) 1.25 MG (50000 UT) CAPS capsule; TAKE ONE CAPSULE BY MOUTH EVERY 7 DAYS  Low energy -     TSH -     B12 and Folate Panel -     Vitamin B6  Other orders -     Discontinue: Diclofenac Sodium 2 % SOLN; Apply twice daily as needed to effected joint.  .. Depression screen [facility] 2/9 05/02/2018 04/11/2018 01/04/2018 08/31/2017 12/23/2016  Decreased Interest 0 0 2 - 3  Down, Depressed, Hopeless 0 0 0 2 2  PHQ - 2 Score 0 0 2 2 5   Altered sleeping 1 1 2 1  -  Tired, decreased energy 2 2 2 2 3   Change in appetite 2 2 0 0 2  Feeling bad or failure about yourself  0 0 0 0 0  Trouble concentrating 2 2 2 3 2   Moving slowly or fidgety/restless 0 0 0 0 0  Suicidal thoughts 0 0 0 0 0  PHQ-9 Score 7 7 8 8  -  Difficult doing work/chores Somewhat difficult Somewhat difficult Not difficult at all - -   .[name] Discussed 150 minutes of exercise a week.  Encouraged vitamin D 1000 units and Calcium 1300mg  or 4 servings of dairy a day.  Pap up to date.  Mammogram up to date.  Colonoscopy declined. Ordered cologuard.  Fasting labs ordered.   Seems like the sharp pain in right hip area could be some hip flexor strain. Given exercises follow up if no improvement. Sent pennsaid for pains. She used on her shoulder and worked really well. If sharp pain in right groin worsening consider follow up with Dr.T and imaging.    See  After Visit Summary for Counseling Recommendations

## 2019-02-01 NOTE — Addendum Note (Signed)
Addended byAnnamaria Helling on: 02/01/2019 01:55 PM   Modules accepted: Orders

## 2019-02-02 LAB — COMPLETE METABOLIC PANEL WITH GFR
AG Ratio: 1.6 (calc) (ref 1.0–2.5)
ALT: 13 U/L (ref 6–29)
AST: 14 U/L (ref 10–35)
Albumin: 4.3 g/dL (ref 3.6–5.1)
Alkaline phosphatase (APISO): 41 U/L (ref 37–153)
BUN: 8 mg/dL (ref 7–25)
CO2: 25 mmol/L (ref 20–32)
Calcium: 9.8 mg/dL (ref 8.6–10.4)
Chloride: 109 mmol/L (ref 98–110)
Creat: 0.8 mg/dL (ref 0.50–1.05)
GFR, Est African American: 100 mL/min/{1.73_m2} (ref >=60)
GFR, Est Non African American: 86 mL/min/{1.73_m2} (ref >=60)
Globulin: 2.7 g/dL (calc) (ref 1.9–3.7)
Glucose, Bld: 81 mg/dL (ref 65–99)
Potassium: 4.5 mmol/L (ref 3.5–5.3)
Sodium: 142 mmol/L (ref 135–146)
Total Bilirubin: 0.6 mg/dL (ref 0.2–1.2)
Total Protein: 7 g/dL (ref 6.1–8.1)

## 2019-02-02 LAB — CBC WITH DIFFERENTIAL/PLATELET
Absolute Monocytes: 644 cells/uL (ref 200–950)
Basophils Absolute: 90 cells/uL (ref 0–200)
Basophils Relative: 1.6 %
Eosinophils Absolute: 263 cells/uL (ref 15–500)
Eosinophils Relative: 4.7 %
HCT: 42.1 % (ref 35.0–45.0)
Hemoglobin: 14.2 g/dL (ref 11.7–15.5)
Lymphs Abs: 1876 cells/uL (ref 850–3900)
MCH: 29.5 pg (ref 27.0–33.0)
MCHC: 33.7 g/dL (ref 32.0–36.0)
MCV: 87.5 fL (ref 80.0–100.0)
MPV: 10.6 fL (ref 7.5–12.5)
Monocytes Relative: 11.5 %
Neutro Abs: 2727 cells/uL (ref 1500–7800)
Neutrophils Relative %: 48.7 %
Platelets: 414 10*3/uL — ABNORMAL HIGH (ref 140–400)
RBC: 4.81 10*6/uL (ref 3.80–5.10)
RDW: 12.4 % (ref 11.0–15.0)
Total Lymphocyte: 33.5 %
WBC: 5.6 10*3/uL (ref 3.8–10.8)

## 2019-02-02 LAB — B12 AND FOLATE PANEL
Folate: 23.4 ng/mL
Vitamin B-12: 649 pg/mL (ref 200–1100)

## 2019-02-02 LAB — LIPID PANEL W/REFLEX DIRECT LDL
Cholesterol: 213 mg/dL — ABNORMAL HIGH (ref <200)
HDL: 78 mg/dL (ref >=50)
LDL Cholesterol (Calc): 120 mg/dL (calc) — ABNORMAL HIGH
Non-HDL Cholesterol (Calc): 135 mg/dL (calc) — ABNORMAL HIGH (ref <130)
Total CHOL/HDL Ratio: 2.7 (calc) (ref <5.0)
Triglycerides: 48 mg/dL (ref <150)

## 2019-02-02 LAB — VITAMIN D 25 HYDROXY (VIT D DEFICIENCY, FRACTURES): Vit D, 25-Hydroxy: 39 ng/mL (ref 30–100)

## 2019-02-02 LAB — HEMOGLOBIN A1C
Hgb A1c MFr Bld: 5.2 % of total Hgb (ref <5.7)
Mean Plasma Glucose: 103 (calc)
eAG (mmol/L): 5.7 (calc)

## 2019-02-02 LAB — VITAMIN B6

## 2019-02-02 LAB — TSH: TSH: 1.17 mIU/L

## 2019-02-02 LAB — FERRITIN: Ferritin: 198 ng/mL (ref 16–232)

## 2019-02-03 ENCOUNTER — Encounter: Payer: Federal, State, Local not specified - PPO | Admitting: Physician Assistant
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
## 2019-02-10 LAB — COLOGUARD: Cologuard: NEGATIVE

## 2019-02-14 ENCOUNTER — Telehealth: Payer: Self-pay | Admitting: Neurology

## 2019-02-14 NOTE — Telephone Encounter (Signed)
Mychart message sent to patient.

## 2019-02-14 NOTE — Telephone Encounter (Signed)
Received results for negative Cologuard. Left message on machine for patient to call back for results. Sent to abstract.

## 2019-02-20 ENCOUNTER — Encounter: Payer: Self-pay | Admitting: Sports Medicine

## 2019-03-02 ENCOUNTER — Encounter: Payer: Self-pay | Admitting: Physician Assistant

## 2019-03-02 NOTE — Telephone Encounter (Signed)
Scanned into chart. kG LPN

## 2019-03-10 ENCOUNTER — Encounter: Payer: Self-pay | Admitting: Physician Assistant

## 2019-03-21 ENCOUNTER — Encounter: Payer: Self-pay | Admitting: Sports Medicine

## 2019-04-24 ENCOUNTER — Telehealth (HOSPITAL_COMMUNITY): Payer: Self-pay | Admitting: *Deleted

## 2019-04-24 NOTE — Telephone Encounter (Signed)
The above patient or their representative was contacted and gave the following answers to these questions:         Do you have any of the following symptoms?n  Fever                    Cough                   Shortness of breath  Do  you have any of the following other symptoms? n   muscle pain         vomiting,        diarrhea        rash         weakness        red eye        abdominal pain         bruising          bruising or bleeding              joint pain           severe headache    Have you been in contact with someone who was or has been sick in the past 2 weeks?n  Yes                 Unsure                         Unable to assess   Does the person that you were in contact with have any of the following symptoms?   Cough         shortness of breath           muscle pain         vomiting,            diarrhea            rash            weakness           fever            red eye           abdominal pain           bruising  or  bleeding                joint pain                severe headache               Have you  or someone you have been in contact with traveled internationally in th last month?         If yes, which countries?   Have you  or someone you have been in contact with traveled outside [place] in th last month?         If yes, which state and city?   COMMENTS OR ACTION PLAN FOR THIS PATIENT:         

## 2019-04-24 NOTE — Telephone Encounter (Signed)
Reminded patient one week in advance of exam for 05/01/2019

## 2019-04-27 ENCOUNTER — Other Ambulatory Visit: Payer: Self-pay

## 2019-04-27 DIAGNOSIS — I82413 Acute embolism and thrombosis of femoral vein, bilateral: Secondary | ICD-10-CM

## 2019-05-01 ENCOUNTER — Ambulatory Visit (HOSPITAL_COMMUNITY)
Admission: RE | Admit: 2019-05-01 | Discharge: 2019-05-01 | Disposition: A | Discharge status: Home / Self Care | Payer: Federal, State, Local not specified - PPO | Source: Ambulatory Visit | Attending: Family | Admitting: Family

## 2019-05-01 ENCOUNTER — Encounter: Payer: Self-pay | Admitting: Family

## 2019-05-01 ENCOUNTER — Ambulatory Visit: Payer: Federal, State, Local not specified - PPO | Admitting: Family

## 2019-05-01 ENCOUNTER — Other Ambulatory Visit: Payer: Self-pay

## 2019-05-01 VITALS — BP 127/84 | HR 54 | Temp 97.0°F | Resp 14 | Ht 64.0 in | Wt 127.0 lb

## 2019-05-01 DIAGNOSIS — I82413 Acute embolism and thrombosis of femoral vein, bilateral: Secondary | ICD-10-CM | POA: Diagnosis not present

## 2019-05-01 DIAGNOSIS — I871 Compression of vein: Secondary | ICD-10-CM

## 2019-05-01 NOTE — Progress Notes (Signed)
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 Donzetta Matters, subsequently had swelling of her bilateral lower extremities and underwent stenting of her right common iliac vein and balloon angioplasty of her left common iliac vein stent in June of 2019.    She denies any leg pain, denies dyspnea or chest pain.  She denies any swelling in her legs.  States that she is sometimes wearing her compression stockings, needs another pair.    Dr. Donzetta Matters last evaluated pt on 10-28-18. At that time she had no symptoms and stents were patent by duplex.  She was to continue her Xarelto for another 2 months given her current supply and then was to transition to lifelong aspirin.  She was to follow-up in 6 months with repeat studies unless she has issues prior we can see her sooner.  She has long term left leg mild weakness that has been evaluated. She denies any hx of stroke or TIA, She denies any known back problems.   She had a uterine ablation in 2019 for menorrhagia which has resolved.  She is off the Xarelto and taking a daily 81 mg ASA.   She has never used tobacco.  She does not have DM.    Past Medical History:  Diagnosis Date  . ADHD (attention deficit hyperactivity disorder)   . Anemia   . DVT (deep venous thrombosis) ([place])   . 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 Menometrorrhagia 01/02/2016    Social History Social History   Tobacco Use  . Smoking status: Never Smoker  . Smokeless tobacco: Never Used  Substance Use Topics  . Alcohol use: No    Alcohol/week: 0.0 standard drinks  . Drug use: No    Family History Family History  Problem Relation Age of Onset  . Diabetes Mother   . Hypertension Mother     Surgical History Past Surgical History:  Procedure Laterality Date  . bone graph   01/1990  . CESAREAN SECTION    . INTRAVASCULAR ULTRASOUND/IVUS Left 05/12/2017   Procedure: Intravascular Ultrasound/IVUS;  Surgeon: Waynetta Sandy, MD;  Location: [facility];  Service: Cardiovascular;  Laterality: Left;  IVC TO LT POPLITEAL VEIN  . INTRAVASCULAR ULTRASOUND/IVUS N/A 03/07/2018   Procedure: INTRAVASCULAR ULTRASOUND/IVUS;  Surgeon: Waynetta Sandy, MD;  Location: [facility];  Service: Cardiovascular;  Laterality: N/A;  . LOWER EXTREMITY VENOGRAPHY Bilateral 05/11/2017   Procedure: Bilateral Lower Extremity Venography;  Surgeon: Serafina Mitchell, MD;  Location: [facility];  Service: Cardiovascular;  Laterality: Bilateral;  . LOWER EXTREMITY VENOGRAPHY N/A 03/07/2018   Procedure: LOWER EXTREMITY VENOGRAPHY;  Surgeon: Waynetta Sandy, MD;  Location: [facility];  Service: Cardiovascular;  Laterality: N/A;  . PERIPHERAL VASCULAR BALLOON ANGIOPLASTY Left 03/07/2018   Procedure: PERIPHERAL VASCULAR BALLOON ANGIOPLASTY;  Surgeon: Waynetta Sandy, MD;  Location: [facility];  Service: Cardiovascular;  Laterality: Left;  left common iliac vein  . PERIPHERAL VASCULAR INTERVENTION Left 05/12/2017   Procedure: PERIPHERAL VASCULAR INTERVENTION;  Surgeon: Waynetta Sandy, MD;  Location: [facility];  Service: Cardiovascular;  Laterality: Left;  IVC TO LT COMMON FEM VEIN  STENT  . PERIPHERAL VASCULAR INTERVENTION Right 03/07/2018   Procedure: PERIPHERAL VASCULAR INTERVENTION;  Surgeon: Donzetta Matters,  Georgia Dom, MD;  Location: [facility];  Service: Cardiovascular;  Laterality: Right;  right common iliac vein  . PERIPHERAL VASCULAR THROMBECTOMY Left 05/11/2017   Procedure: PERIPHERAL VASCULAR THROMBECTOMY;  Surgeon: Serafina Mitchell, MD;  Location: [facility];  Service: Cardiovascular;  Laterality: Left;  left lower extremity venous    Allergies  Allergen Reactions  . Wellbutrin [Bupropion]  Other (See Comments)    Temporary memory loss  . Latex Rash and Swelling    Current Outpatient Medications  Medication Sig Dispense Refill  . aspirin 81 MG tablet Take by mouth.    . Diclofenac Sodium 2 % SOLN Apply 2 pumps twice daily as needed to effected joint. 1 Bottle 2  . Iron-FA-B Cmp-C-Biot-Probiotic (FUSION PLUS) CAPS TAKE 1 CAPSULE BY MOUTH EVERY DAY 90 capsule 3  . montelukast (SINGULAIR) 10 MG tablet TAKE 1 TABLET (10 MG TOTAL) BY MOUTH AT BEDTIME. 90 tablet 3  . Vitamin D, Ergocalciferol, (DRISDOL) 1.25 MG (50000 UT) CAPS capsule TAKE ONE CAPSULE BY MOUTH EVERY 7 DAYS 12 capsule 3   No current facility-administered medications for this visit.     REVIEW OF SYSTEMS: see HPI for pertinent positives and negatives   Physical Examination  Vitals:   05/01/19 0858  BP: 127/84  Pulse: (!) 54  Resp: 14  Temp: (!) 97 F (36.1 C)  TempSrc: Temporal  SpO2: 100%  Weight: 127 lb (57.6 kg)  Height: 5\' 4"  (1.626 m)   Body mass index is 21.8 kg/m.  General: Slender well appearing female  HEENT:  No gross abnormalities Pulmonary: Respirations are non-labored, CTAB, good air movement in all fields  Abdomen: Soft and non-tender with normal pitched bowel sounds.  Musculoskeletal: There are no major deformities.   Neurologic: No focal weakness or paresthesias are detected. Skin: There are no ulcer or rashes noted. Psychiatric: The patient has normal affect. Cardiovascular: There is a regular rate and rhythm, there is a transient cardiac murmur that is audible on inspiration.   Vascular: Vessel Right Left  Radial 2+Palpable 2+Palpable  Carotid  without bruit  without bruit  Aorta Not palpable N/A  Popliteal Not palpable Not palpable  PT 2+Palpable 1+Palpable  DP 1+Palpable notPalpable     DATA  BLE Venous Duplex (Date: 05/01/2019):  IVC/Iliac: Patent bilateral iliac veins stents. Patent and compressible bilateral common femoral veins.   Medical Decision Making   April Vega is a 48 y.o. 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 Donzetta Matters, subsequently had swelling of her bilateral lower extremities and underwent stenting of her right common iliac vein and balloon angioplasty of her left common iliac vein stent in June of 2019.  She has no swelling of her lower extremities, no dyspnea.    She has a transient cardiac murmur that is audible on inspiration. She is asymptomatic of this. I advised her to speak with her PCP re this, she has no known hx of cardiac murmur.   She states that she needs another pair of compression hose; she was given information about [employer], a discount supplier of compression hose in [place].  Continue daily compression hose use. Continue daily 81 mg ASA.   I advise return in 6 months with bilateral iliac vein ultrasound, see me or the PA, or Dr. Donzetta Matters on her return.   Thank you for allowing [place] to participate in this patient's care.  Clemon Chambers, RN, MSN, FNP-C [facility] Office: [phone]  05/01/2019, 9:19 AM  Clinic MD: Trula Slade

## 2019-05-01 NOTE — Patient Instructions (Addendum)
How to Use Compression Stockings Compression stockings are elastic socks that squeeze the legs. They help increase blood flow (circulation) to the legs, decrease swelling in the legs, and reduce the chance of developing blood clots in the lower legs. Compression stockings are often used by people who:  Are recovering from surgery.  Have poor circulation in their legs.  Tend to get blood clots in their legs.  Have bulging (varicose) veins.  Sit or stay in bed for long periods of time. Follow instructions from your health care provider about how and when to wear your compression stockings. How to wear compression stockings Before you put on your compression stockings:  Make sure that they are the correct size and degree of compression. If you do not know your size or required grade of compression, ask your health care provider and follow the manufacturer's instructions that come with the stockings.  Make sure that they are clean, dry, and in good condition.  Check them for rips and tears. Do not put them on if they are ripped or torn. Put your stockings on first thing in the morning, before you get out of bed. Keep them on for as long as your health care provider advises. When you are wearing your stockings:  Keep them as smooth as possible. Do not allow them to bunch up. It is especially important to prevent the stockings from bunching up around your toes or behind your knees.  Do not roll the stockings downward and leave them rolled down. This can decrease blood flow to your leg.  Change them right away if they become wet or dirty. When you take off your stockings, inspect your legs and feet. Check for:  Open sores.  Red spots.  Swelling. General tips  Do not stop wearing compression stockings without talking to your health care provider first.  Wash your stockings every day with mild detergent in cold or warm water. Do not use bleach. Air-dry your stockings or dry them in a  clothes dryer on low heat. It may be helpful to have two pairs so that you have a pair to wear while the other is being washed.  Replace your stockings every 3-6 months.  If skin moisturizing is part of your treatment plan, apply lotion or cream at night so that your skin will be dry when you put on the stockings in the morning. It is harder to put the stockings on when you have lotion on your legs or feet.  Wear nonskid shoes or slip-resistant socks when walking while wearing compression stockings. Contact a health care provider and remove your stockings if you have:  A feeling of pins and needles in your feet or legs.  Open sores, red spots, or other skin changes on your feet or legs.  Swelling or pain that gets worse. Get help right away if you have:  Numbness or tingling in your lower legs that does not get better right after you take the stockings off.  Toes or feet that are unusually cold or turn a bluish color.  A warm or red area on your leg.  New swelling or soreness in your leg.  Shortness of breath.  Chest pain.  A fast or irregular heartbeat.  Light-headedness.  Dizziness. Summary  Compression stockings are elastic socks that squeeze the legs.  They help increase blood flow (circulation) to the legs, decrease swelling in the legs, and reduce the chance of developing blood clots in the lower legs.  Follow instructions  from your health care provider about how and when to wear your compression stockings.  Do not stop wearing your compression stockings without talking to your health care provider first. This information is not intended to replace advice given to you by your health care provider. Make sure you discuss any questions you have with your health care provider. Document Released: 07/05/2009 Document Revised: 09/09/2017 Document Reviewed: 09/09/2017 Elsevier Patient Education  2020 [place].    Deep Vein Thrombosis  Deep vein thrombosis (DVT) is  a condition in which a blood clot forms in a deep vein, such as a lower leg, thigh, or arm vein. A clot is blood that has thickened into a gel or solid. This condition is dangerous. It can lead to serious and even life-threatening complications if the clot travels to the lungs and causes a blockage (pulmonary embolism). It can also damage veins in the leg. This can result in leg pain, swelling, discoloration, and sores (post-thrombotic syndrome). What are the causes? This condition may be caused by:  A slowdown of blood flow.  Damage to a vein.  A condition that causes blood to clot more easily, such as an inherited clotting disorder. What increases the risk? The following factors may make you more likely to develop this condition:  Being overweight.  Being older, especially over age 76.  Sitting or lying down for more than four hours.  Being in the hospital.  Lack of physical activity (sedentary lifestyle).  Pregnancy, being in childbirth, or having recently given birth.  Taking medicines that contain estrogen, such as medicines to prevent pregnancy.  Smoking.  A history of any of the following: ? Blood clots or a blood clotting disease. ? Peripheral vascular disease. ? Inflammatory bowel disease. ? Cancer. ? Heart disease. ? Genetic conditions that affect how your blood clots, such as Factor V Leiden mutation. ? Neurological diseases that affect your legs (leg paresis). ? A recent injury, such as a car accident. ? Major or lengthy surgery. ? A central line placed inside a large vein. What are the signs or symptoms? Symptoms of this condition include:  Swelling, pain, or tenderness in an arm or leg.  Warmth, redness, or discoloration in an arm or leg. If the clot is in your leg, symptoms may be more noticeable or worse when you stand or walk. Some people may not develop any symptoms. How is this diagnosed? This condition is diagnosed with:  A medical history and  physical exam.  Tests, such as: ? Blood tests. These are done to check how well your blood clots. ? Ultrasound. This is done to check for clots. ? Venogram. For this test, contrast dye is injected into a vein and X-rays are taken to check for any clots. How is this treated? Treatment for this condition depends on:  The cause of your DVT.  Your risk for bleeding or developing more clots.  Any other medical conditions that you have. Treatment may include:  Taking a blood thinner (anticoagulant). This type of medicine prevents clots from forming. It may be taken by mouth, injected under the skin, or injected through an IV (catheter).  Injecting clot-dissolving medicines into the affected vein (catheter-directed thrombolysis).  Having surgery. Surgery may be done to: ? Remove the clot. ? Place a filter in a large vein to catch blood clots before they reach the lungs. Some treatments may be continued for up to six months. Follow these instructions at home: If you are taking blood thinners:  Take the medicine exactly as told by your health care provider. Some blood thinners need to be taken at the same time every day. Do not skip a dose.  Talk with your health care provider before you take any medicines that contain aspirin or NSAIDs. These medicines increase your risk for dangerous bleeding.  Ask your health care provider about foods and drugs that could change the way the medicine works (may interact). Avoid those things if your health care provider tells you to do so.  Blood thinners can cause easy bruising and may make it difficult to stop bleeding. Because of this: ? Be very careful when using knives, scissors, or other sharp objects. ? Use an electric razor instead of a blade. ? Avoid activities that could cause injury or bruising, and follow instructions about how to prevent falls.  Wear a medical alert bracelet or carry a card that lists what medicines you take. General  instructions  Take over-the-counter and prescription medicines only as told by your health care provider.  Return to your normal activities as told by your health care provider. Ask your health care provider what activities are safe for you.  Wear compression stockings if recommended by your health care provider.  Keep all follow-up visits as told by your health care provider. This is important. How is this prevented? To lower your risk of developing this condition again:  For 30 or more minutes every day, do an activity that: ? Involves moving your arms and legs. ? Increases your heart rate.  When traveling for longer than four hours: ? Exercise your arms and legs every hour. ? Drink plenty of water. ? Avoid drinking alcohol.  Avoid sitting or lying for a long time without moving your legs.  If you have surgery or you are hospitalized, ask about ways to prevent blood clots. These may include taking frequent walks or using anticoagulants.  Stay at a healthy weight.  If you are a woman who is older than age 15, avoid unnecessary use of medicines that contain estrogen, such as some birth control pills.  Do not use any products that contain nicotine or tobacco, such as cigarettes and e-cigarettes. This is especially important if you take estrogen medicines. If you need help quitting, ask your health care provider. Contact a health care provider if:  You miss a dose of your blood thinner.  Your menstrual period is heavier than usual.  You have unusual bruising. Get help right away if:  You have: ? New or increased pain, swelling, or redness in an arm or leg. ? Numbness or tingling in an arm or leg. ? Shortness of breath. ? Chest pain. ? A rapid or irregular heartbeat. ? A severe headache or confusion. ? A cut that will not stop bleeding.  There is blood in your vomit, stool, or urine.  You have a serious fall or accident, or you hit your head.  You feel light-headed or  dizzy.  You cough up blood. These symptoms may represent a serious problem that is an emergency. Do not wait to see if the symptoms will go away. Get medical help right away. Call your local emergency services (911 in the [place] Do not drive yourself to the hospital. Summary  Deep vein thrombosis (DVT) is a condition in which a blood clot forms in a deep vein, such as a lower leg, thigh, or arm vein.  Symptoms can include swelling, warmth, pain, and redness in your leg or arm.  This  condition may be treated with a blood thinner (anticoagulant medicine), medicine that is injected to dissolve blood clots,compression stockings, or surgery.  If you are prescribed blood thinners, take them exactly as told. This information is not intended to replace advice given to you by your health care provider. Make sure you discuss any questions you have with your health care provider. Document Released: 09/07/2005 Document Revised: 08/20/2017 Document Reviewed: 02/05/2017 Elsevier Patient Education  2020 [employer].

## 2019-06-14 ENCOUNTER — Encounter: Payer: Self-pay | Admitting: Sports Medicine

## 2019-07-12 ENCOUNTER — Encounter: Payer: Self-pay | Admitting: Physician Assistant

## 2019-07-12 DIAGNOSIS — N6002 Solitary cyst of left breast: Secondary | ICD-10-CM

## 2019-07-25 ENCOUNTER — Ambulatory Visit: Payer: Federal, State, Local not specified - PPO | Admitting: Physician Assistant

## 2019-07-31 ENCOUNTER — Ambulatory Visit: Payer: Federal, State, Local not specified - PPO | Admitting: Physician Assistant

## 2019-08-02 ENCOUNTER — Other Ambulatory Visit: Payer: Self-pay

## 2019-08-02 ENCOUNTER — Encounter: Payer: Self-pay | Admitting: Physician Assistant

## 2019-08-02 ENCOUNTER — Ambulatory Visit (INDEPENDENT_AMBULATORY_CARE_PROVIDER_SITE_OTHER): Payer: Federal, State, Local not specified - PPO | Admitting: Physician Assistant

## 2019-08-02 VITALS — Ht 62.0 in | Wt 130.0 lb

## 2019-08-02 DIAGNOSIS — M5416 Radiculopathy, lumbar region: Secondary | ICD-10-CM | POA: Diagnosis not present

## 2019-08-02 DIAGNOSIS — D508 Other iron deficiency anemias: Secondary | ICD-10-CM | POA: Diagnosis not present

## 2019-08-02 DIAGNOSIS — E538 Deficiency of other specified B group vitamins: Secondary | ICD-10-CM

## 2019-08-02 DIAGNOSIS — K909 Intestinal malabsorption, unspecified: Secondary | ICD-10-CM

## 2019-08-02 DIAGNOSIS — E559 Vitamin D deficiency, unspecified: Secondary | ICD-10-CM

## 2019-08-02 MED ORDER — CYCLOBENZAPRINE HCL 5 MG PO TABS: 5.0000 mg | ORAL_TABLET | Freq: Three times a day (TID) | ORAL | 1 refills | Status: DC | PRN

## 2019-08-02 MED ORDER — VITAMIN D (ERGOCALCIFEROL) 1.25 MG (50000 UNIT) PO CAPS: ORAL_CAPSULE | ORAL | 3 refills | Status: DC

## 2019-08-02 MED ORDER — AMBULATORY NON FORMULARY MEDICATION: 0 refills | Status: DC

## 2019-08-02 NOTE — Progress Notes (Signed)
Subjective:    Patient ID: April Vega, female    DOB: [birth year], 48 y.o.   MRN: [id number]  HPI  Pt is a 48 yo female who presents to the clinic for follow up.   She is concerned about some intermittent left leg numbness and tingling. She has hx of dVT of left thigh. She has been off [place] since feb. Symptoms seem to happen more at night. No trauma.   She wonders about her iron. She stopped taking oral iron for last month. Wants checked.   Pt has not been called for her mammogram/ultrasound follow up on breast cyst. She wanted to go to [facility].   Review of Systems  All other systems reviewed and are negative.      Objective:   Physical Exam Vitals signs reviewed.  Constitutional:      Appearance: Normal appearance.  Cardiovascular:     Rate and Rhythm: Normal rate and regular rhythm.  Pulmonary:     Effort: Pulmonary effort is normal.     Breath sounds: Normal breath sounds.  Musculoskeletal:        General: No swelling.     Comments: No size difference between the thigh or calf.  No tenderness over lumbar spine.  Tight paraspinal muscles.  5/5 lower ext strength. 2+symetric patellar reflexes.   Neurological:     General: No focal deficit present.     Mental Status: She is alert and oriented to person, place, and time.  Psychiatric:        Mood and Affect: Mood normal.        Behavior: Behavior normal.           Assessment & Plan:   [name] KitchenLoriane was seen today for dizziness.  Diagnoses and all orders for this visit:  Lumbar radiculitis -     cyclobenzaprine (FLEXERIL) 5 MG tablet; Take 1 tablet (5 mg total) by mouth 3 (three) times daily as needed for muscle spasms. -     AMBULATORY NON FORMULARY MEDICATION; Tens unit.  Iron malabsorption -     Vitamin D (25 hydroxy) -     B12 and Folate Panel -     Ferritin -     CBC -     Vitamin B6 -     BASIC METABOLIC PANEL WITH GFR  123456 deficiency -     Vitamin D (25 hydroxy) -     B12 and Folate Panel -      Ferritin -     CBC -     Vitamin B6 -     BASIC METABOLIC PANEL WITH GFR  Iron deficiency anemia secondary to inadequate dietary iron intake -     Vitamin D (25 hydroxy) -     B12 and Folate Panel -     Ferritin -     CBC -     Vitamin B6 -     BASIC METABOLIC PANEL WITH GFR  Vitamin D deficiency -     Vitamin D, Ergocalciferol, (DRISDOL) 1.25 MG (50000 UT) CAPS capsule; TAKE ONE CAPSULE BY MOUTH EVERY 7 DAYS   MM/US of breast ordered 10/21. Handed to Bantam to call and make sure patient is scheduled.   Reassured patient I do not think her left sided numbness and tingling is another clot forming. I think it could be related to her back or some RLS. Muscle relaxer, tens unit, icy hot, stretches and exercises given. May need xray in the future. Follow up  as needed.

## 2019-08-02 NOTE — Patient Instructions (Addendum)

## 2019-08-03 NOTE — Progress Notes (Signed)
Skyleen,   Vitamin D better than 6 months ago and in normal range.  b12 in norma range and above goal of 400 but had been ranging around 600. This could be due to not taking the biotin with B vitamins in it. Maybe you try taking b12 1033mcg and B6 separately.  You are not anemic and iron stores look good.  Kidney, liver, glucose look good.   B6 pending.   Overall labs look really good.   April Vega

## 2019-08-06 LAB — BASIC METABOLIC PANEL WITH GFR
BUN: 8 mg/dL (ref 7–25)
CO2: 26 mmol/L (ref 20–32)
Calcium: 9.3 mg/dL (ref 8.6–10.4)
Chloride: 107 mmol/L (ref 98–110)
Creat: 0.66 mg/dL (ref 0.50–1.05)
GFR, Est African American: 119 mL/min/{1.73_m2} (ref >=60)
GFR, Est Non African American: 103 mL/min/{1.73_m2} (ref >=60)
Glucose, Bld: 79 mg/dL (ref 65–99)
Potassium: 3.9 mmol/L (ref 3.5–5.3)
Sodium: 140 mmol/L (ref 135–146)

## 2019-08-06 LAB — B12 AND FOLATE PANEL
Folate: 12.6 ng/mL
Vitamin B-12: 429 pg/mL (ref 200–1100)

## 2019-08-06 LAB — CBC
HCT: 37.5 % (ref 35.0–45.0)
Hemoglobin: 12.5 g/dL (ref 11.7–15.5)
MCH: 29 pg (ref 27.0–33.0)
MCHC: 33.3 g/dL (ref 32.0–36.0)
MCV: 87 fL (ref 80.0–100.0)
MPV: 10.4 fL (ref 7.5–12.5)
Platelets: 393 10*3/uL (ref 140–400)
RBC: 4.31 10*6/uL (ref 3.80–5.10)
RDW: 12.2 % (ref 11.0–15.0)
WBC: 5 10*3/uL (ref 3.8–10.8)

## 2019-08-06 LAB — VITAMIN D 25 HYDROXY (VIT D DEFICIENCY, FRACTURES): Vit D, 25-Hydroxy: 43 ng/mL (ref 30–100)

## 2019-08-06 LAB — FERRITIN: Ferritin: 227 ng/mL (ref 16–232)

## 2019-08-06 LAB — VITAMIN B6: Vitamin B6: 8.4 ng/mL (ref 2.1–21.7)

## 2019-08-07 NOTE — Progress Notes (Signed)
B6 good range.

## 2019-08-11 ENCOUNTER — Encounter: Payer: Self-pay | Admitting: Physician Assistant

## 2019-08-11 DIAGNOSIS — G2581 Restless legs syndrome: Secondary | ICD-10-CM | POA: Insufficient documentation

## 2019-08-23 DIAGNOSIS — N6012 Diffuse cystic mastopathy of left breast: Secondary | ICD-10-CM | POA: Diagnosis not present

## 2019-08-23 DIAGNOSIS — N6002 Solitary cyst of left breast: Secondary | ICD-10-CM | POA: Diagnosis not present

## 2019-08-23 LAB — HM MAMMOGRAPHY

## 2019-08-31 ENCOUNTER — Encounter: Payer: Self-pay | Admitting: Physician Assistant

## 2019-11-16 ENCOUNTER — Other Ambulatory Visit: Payer: Self-pay

## 2019-11-16 DIAGNOSIS — I82413 Acute embolism and thrombosis of femoral vein, bilateral: Secondary | ICD-10-CM

## 2019-11-17 ENCOUNTER — Encounter: Payer: Self-pay | Admitting: Vascular Surgery

## 2019-11-17 ENCOUNTER — Ambulatory Visit: Payer: Federal, State, Local not specified - PPO | Admitting: Vascular Surgery

## 2019-11-17 ENCOUNTER — Other Ambulatory Visit: Payer: Self-pay

## 2019-11-17 ENCOUNTER — Ambulatory Visit (HOSPITAL_COMMUNITY)
Admission: RE | Admit: 2019-11-17 | Discharge: 2019-11-17 | Disposition: A | Discharge status: Home / Self Care | Payer: Federal, State, Local not specified - PPO | Source: Ambulatory Visit | Attending: Vascular Surgery | Admitting: Vascular Surgery

## 2019-11-17 VITALS — BP 126/79 | HR 74 | Temp 98.2°F | Resp 20 | Ht 62.0 in | Wt 123.0 lb

## 2019-11-17 DIAGNOSIS — I871 Compression of vein: Secondary | ICD-10-CM

## 2019-11-17 DIAGNOSIS — I82413 Acute embolism and thrombosis of femoral vein, bilateral: Secondary | ICD-10-CM | POA: Diagnosis not present

## 2019-11-17 NOTE — Progress Notes (Signed)
Patient ID: April Vega, female   DOB: April 09, 1971, 49 y.o.   MRN: [id number]  Reason for Consult: Follow-up   Referred by Donella Stade, PA-C  Subjective:     HPI:  April Vega is a 49 y.o. 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  She subsequently had swelling in her bilateral lower extremities underwent stenting of her right common leg vein with balloon angioplasty on the left in June 2019.  We have subsequently discontinued anticoagulation.  She occasionally wears compression stockings.  She continues to work as a Pharmacist, hospital.  She takes aspirin daily does walk significant amounts every day.  She is scheduled to get her Covid vaccine in 2 days from now.  She denies any lower extremity pain.  She denies any lower extremity swelling.  Past Medical History:  Diagnosis Date  . ADHD (attention deficit hyperactivity disorder)   . Anemia   . DVT (deep venous thrombosis) ([place])   . 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 Menometrorrhagia 01/02/2016   Family History  Problem Relation Age of Onset  . Diabetes Mother   . Hypertension Mother    Past Surgical History:  Procedure Laterality Date  . bone graph  01/1990  . CESAREAN SECTION    . INTRAVASCULAR ULTRASOUND/IVUS Left 05/12/2017   Procedure: Intravascular Ultrasound/IVUS;  Surgeon: Waynetta Sandy, MD;  Location: [facility];  Service: Cardiovascular;  Laterality: Left;  IVC TO LT POPLITEAL VEIN  . INTRAVASCULAR ULTRASOUND/IVUS N/A 03/07/2018   Procedure: INTRAVASCULAR ULTRASOUND/IVUS;  Surgeon: Waynetta Sandy, MD;  Location: [place] [facility];  Service: Cardiovascular;  Laterality: N/A;  . LOWER EXTREMITY VENOGRAPHY Bilateral 05/11/2017   Procedure: Bilateral Lower Extremity Venography;  Surgeon: Serafina Mitchell, MD;  Location: [facility];  Service: Cardiovascular;  Laterality: Bilateral;  . LOWER EXTREMITY  VENOGRAPHY N/A 03/07/2018   Procedure: LOWER EXTREMITY VENOGRAPHY;  Surgeon: Waynetta Sandy, MD;  Location: [facility];  Service: Cardiovascular;  Laterality: N/A;  . PERIPHERAL VASCULAR BALLOON ANGIOPLASTY Left 03/07/2018   Procedure: PERIPHERAL VASCULAR BALLOON ANGIOPLASTY;  Surgeon: Waynetta Sandy, MD;  Location: [place] [facility];  Service: Cardiovascular;  Laterality: Left;  left common iliac vein  . PERIPHERAL VASCULAR INTERVENTION Left 05/12/2017   Procedure: PERIPHERAL VASCULAR INTERVENTION;  Surgeon: Waynetta Sandy, MD;  Location: [place] [facility];  Service: Cardiovascular;  Laterality: Left;  IVC TO LT COMMON FEM VEIN  STENT  . PERIPHERAL VASCULAR INTERVENTION Right 03/07/2018   Procedure: PERIPHERAL VASCULAR INTERVENTION;  Surgeon: Waynetta Sandy, MD;  Location: [facility] [facility];  Service: Cardiovascular;  Laterality: Right;  right common iliac vein  . PERIPHERAL VASCULAR THROMBECTOMY Left 05/11/2017   Procedure: PERIPHERAL VASCULAR THROMBECTOMY;  Surgeon: Serafina Mitchell, MD;  Location: [place] [facility];  Service: Cardiovascular;  Laterality: Left;  left lower extremity venous    Short Social History:  Social History   Tobacco Use  . Smoking status: Never Smoker  . Smokeless tobacco: Never Used  Substance Use Topics  . Alcohol use: No    Alcohol/week: 0.0 standard drinks    Allergies  Allergen Reactions  . Wellbutrin [Bupropion] Other (See Comments)    Temporary memory loss  . Latex Rash and Swelling    Current Outpatient Medications  Medication Sig Dispense Refill  . AMBULATORY NON FORMULARY MEDICATION Tens unit. 1 Device 0  . Ascorbic  Acid (VITAMIN C PO) Take by mouth daily.    [name] aspirin 81 MG tablet Take by mouth.    [name] BIOTIN PO Take by mouth daily.    . cyclobenzaprine (FLEXERIL) 5 MG tablet Take 1 tablet (5 mg total) by mouth 3 (three) times daily as needed for muscle spasms. 30 tablet 1  . Iron-FA-B  Cmp-C-Biot-Probiotic (FUSION PLUS) CAPS TAKE 1 CAPSULE BY MOUTH EVERY DAY 90 capsule 3  . Vitamin D, Ergocalciferol, (DRISDOL) 1.25 MG (50000 UT) CAPS capsule TAKE ONE CAPSULE BY MOUTH EVERY 7 DAYS 12 capsule 3  . Diclofenac Sodium 2 % SOLN Apply 2 pumps twice daily as needed to effected joint. (Patient not taking: Reported on 11/17/2019) 1 Bottle 2   No current facility-administered medications for this visit.    Review of Systems  Constitutional:  Constitutional negative. HENT: HENT negative.  Eyes: Eyes negative.  Respiratory: Respiratory negative.  Cardiovascular: Cardiovascular negative.  GI: Gastrointestinal negative.  Musculoskeletal: Musculoskeletal negative.  Skin: Skin negative.  Neurological: Neurological negative. Hematologic: Hematologic/lymphatic negative.  Psychiatric: Psychiatric negative.        Objective:  Objective   Vitals:   11/17/19 0842  BP: 126/79  Pulse: 74  Resp: 20  Temp: 98.2 F (36.8 C)  SpO2: 99%    Physical Exam HENT:     Head: Normocephalic.     Nose:     Comments: Wearing a mask Eyes:     Pupils: Pupils are equal, round, and reactive to light.  Cardiovascular:     Rate and Rhythm: Normal rate.     Pulses: Normal pulses.  Pulmonary:     Effort: Pulmonary effort is normal.  Abdominal:     General: Abdomen is flat.  Musculoskeletal:        General: No swelling. Normal range of motion.     Cervical back: Normal range of motion and neck supple.     Right lower leg: No edema.     Left lower leg: No edema.  Skin:    General: Skin is warm.     Capillary Refill: Capillary refill takes less than 2 seconds.  Neurological:     General: No focal deficit present.     Mental Status: She is alert.  Psychiatric:        Mood and Affect: Mood normal.        Thought Content: Thought content normal.        Judgment: Judgment normal.     Data: I have independently interpreted her IVC iliac study.  Bilateral iliac vein stents appear patent  as do her common femoral veins bilaterally.     Assessment/Plan:     49 year old female with above-noted history that began 2018 with stenting for May Thurner subsequently stenting on the right side with balloon angioplasty of the left.  She is now doing very well remains asymptomatic on aspirin only.  I have counseled her to wear compression stockings when tolerable and to have a dedicated walking program.  She will follow-up in 1 year with repeat IVC iliac duplex.     Waynetta Sandy MD [facility]

## 2019-11-20 ENCOUNTER — Other Ambulatory Visit: Payer: Self-pay | Admitting: *Deleted

## 2019-11-20 DIAGNOSIS — I871 Compression of vein: Secondary | ICD-10-CM

## 2020-03-08 DIAGNOSIS — Z1231 Encounter for screening mammogram for malignant neoplasm of breast: Secondary | ICD-10-CM | POA: Diagnosis not present

## 2020-03-08 LAB — HM MAMMOGRAPHY

## 2020-03-11 ENCOUNTER — Encounter: Payer: Self-pay | Admitting: Physician Assistant

## 2020-03-26 ENCOUNTER — Encounter: Payer: Self-pay | Admitting: Physician Assistant

## 2020-04-24 ENCOUNTER — Encounter: Payer: Self-pay | Admitting: Physician Assistant

## 2020-04-26 ENCOUNTER — Ambulatory Visit (INDEPENDENT_AMBULATORY_CARE_PROVIDER_SITE_OTHER): Payer: Federal, State, Local not specified - PPO | Admitting: Physician Assistant

## 2020-04-26 ENCOUNTER — Other Ambulatory Visit: Payer: Self-pay

## 2020-04-26 ENCOUNTER — Telehealth: Payer: Federal, State, Local not specified - PPO | Admitting: Nurse Practitioner

## 2020-04-26 VITALS — BP 123/78 | HR 71 | Ht 62.0 in | Wt 130.0 lb

## 2020-04-26 DIAGNOSIS — R438 Other disturbances of smell and taste: Secondary | ICD-10-CM | POA: Diagnosis not present

## 2020-04-26 DIAGNOSIS — E559 Vitamin D deficiency, unspecified: Secondary | ICD-10-CM | POA: Diagnosis not present

## 2020-04-26 DIAGNOSIS — D508 Other iron deficiency anemias: Secondary | ICD-10-CM

## 2020-04-26 DIAGNOSIS — K2900 Acute gastritis without bleeding: Secondary | ICD-10-CM | POA: Diagnosis not present

## 2020-04-26 DIAGNOSIS — Z79899 Other long term (current) drug therapy: Secondary | ICD-10-CM

## 2020-04-26 MED ORDER — OMEPRAZOLE 40 MG PO CPDR: 40.0000 mg | DELAYED_RELEASE_CAPSULE | Freq: Every day | ORAL | 1 refills | Status: DC

## 2020-04-26 NOTE — Progress Notes (Signed)
Started two weeks ago  1-2 hours after eating 15 minutes after drinking Develops bitter taste in mouth No reflux symptoms Some nausea No vomiting No abdominal pain No trouble swallowing

## 2020-04-26 NOTE — Patient Instructions (Signed)
Gastritis, Adult  Gastritis is swelling (inflammation) of the stomach. Gastritis can develop quickly (acute). It can also develop slowly over time (chronic). It is important to get help for this condition. If you do not get help, your stomach can bleed, and you can get sores (ulcers) in your stomach. What are the causes? This condition may be caused by:  Germs that get to your stomach.  Drinking too much alcohol.  Medicines you are taking.  Too much acid in the stomach.  A disease of the intestines or stomach.  Stress.  An allergic reaction.  Crohn's disease.  Some cancer treatments (radiation). Sometimes the cause of this condition is not known. What are the signs or symptoms? Symptoms of this condition include:  Pain in your stomach.  A burning feeling in your stomach.  Feeling sick to your stomach (nauseous).  Throwing up (vomiting).  Feeling too full after you eat.  Weight loss.  Bad breath.  Throwing up blood.  Blood in your poop (stool). How is this diagnosed? This condition may be diagnosed with:  Your medical history and symptoms.  A physical exam.  Tests. These can include: ? Blood tests. ? Stool tests. ? A procedure to look inside your stomach (upper endoscopy). ? A test in which a sample of tissue is taken for testing (biopsy). How is this treated? Treatment for this condition depends on what caused it. You may be given:  Antibiotic medicine, if your condition was caused by germs.  H2 blockers and similar medicines, if your condition was caused by too much acid. Follow these instructions at home: Medicines  Take over-the-counter and prescription medicines only as told by your doctor.  If you were prescribed an antibiotic medicine, take it as told by your doctor. Do not stop taking it even if you start to feel better. Eating and drinking   Eat small meals often, instead of large meals.  Avoid foods and drinks that make your symptoms  worse.  Drink enough fluid to keep your pee (urine) pale yellow. Alcohol use  Do not drink alcohol if: ? Your doctor tells you not to drink. ? You are pregnant, may be pregnant, or are planning to become pregnant.  If you drink alcohol: ? Limit your use to:  0-1 drink a day for women.  0-2 drinks a day for men. ? Be aware of how much alcohol is in your drink. In the [place] one drink equals one 12 oz bottle of beer (355 mL), one 5 oz glass of wine (148 mL), or one 1 oz glass of hard liquor (44 mL). General instructions  Talk with your doctor about ways to manage stress. You can exercise or do deep breathing, meditation, or yoga.  Do not smoke or use products that have nicotine or tobacco. If you need help quitting, ask your doctor.  Keep all follow-up visits as told by your doctor. This is important. Contact a doctor if:  Your symptoms get worse.  Your symptoms go away and then come back. Get help right away if:  You throw up blood or something that looks like coffee grounds.  You have black or dark red poop.  You throw up any time you try to drink fluids.  Your stomach pain gets worse.  You have a fever.  You do not feel better after one week. Summary  Gastritis is swelling (inflammation) of the stomach.  You must get help for this condition. If you do not get help, your stomach   can bleed, and you can get sores (ulcers).  This condition is diagnosed with medical history, physical exam, or tests.  You can be treated with medicines for germs or medicines to block too much acid in your stomach. This information is not intended to replace advice given to you by your health care provider. Make sure you discuss any questions you have with your health care provider. Document Revised: 01/25/2018 Document Reviewed: 01/25/2018 Elsevier Patient Education  2020 Elsevier Inc.  
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
## 2020-04-28 ENCOUNTER — Encounter: Payer: Self-pay | Admitting: Physician Assistant

## 2020-04-28 DIAGNOSIS — K2970 Gastritis, unspecified, without bleeding: Secondary | ICD-10-CM | POA: Insufficient documentation

## 2020-04-28 DIAGNOSIS — K2900 Acute gastritis without bleeding: Secondary | ICD-10-CM | POA: Insufficient documentation

## 2020-04-28 DIAGNOSIS — R438 Other disturbances of smell and taste: Secondary | ICD-10-CM | POA: Insufficient documentation

## 2020-04-29 DIAGNOSIS — E559 Vitamin D deficiency, unspecified: Secondary | ICD-10-CM | POA: Diagnosis not present

## 2020-04-29 DIAGNOSIS — D508 Other iron deficiency anemias: Secondary | ICD-10-CM | POA: Diagnosis not present

## 2020-04-29 DIAGNOSIS — R438 Other disturbances of smell and taste: Secondary | ICD-10-CM | POA: Diagnosis not present

## 2020-04-29 DIAGNOSIS — K2900 Acute gastritis without bleeding: Secondary | ICD-10-CM | POA: Diagnosis not present

## 2020-04-29 LAB — COMPLETE METABOLIC PANEL WITH GFR
AG Ratio: 1.5 (calc) (ref 1.0–2.5)
ALT: 12 U/L (ref 6–29)
AST: 14 U/L (ref 10–35)
Albumin: 4.2 g/dL (ref 3.6–5.1)
Alkaline phosphatase (APISO): 47 U/L (ref 37–153)
BUN: 7 mg/dL (ref 7–25)
CO2: 25 mmol/L (ref 20–32)
Calcium: 9.4 mg/dL (ref 8.6–10.4)
Chloride: 106 mmol/L (ref 98–110)
Creat: 0.56 mg/dL (ref 0.50–1.05)
GFR, Est African American: 125 mL/min/{1.73_m2} (ref >=60)
GFR, Est Non African American: 108 mL/min/{1.73_m2} (ref >=60)
Globulin: 2.8 g/dL (calc) (ref 1.9–3.7)
Glucose, Bld: 73 mg/dL (ref 65–99)
Potassium: 4.1 mmol/L (ref 3.5–5.3)
Sodium: 139 mmol/L (ref 135–146)
Total Bilirubin: 0.6 mg/dL (ref 0.2–1.2)
Total Protein: 7 g/dL (ref 6.1–8.1)

## 2020-04-29 LAB — IRON,TIBC AND FERRITIN PANEL
%SAT: 18 % (calc) (ref 16–45)
Ferritin: 154 ng/mL (ref 16–232)
Iron: 42 ug/dL — ABNORMAL LOW (ref 45–160)
TIBC: 231 mcg/dL (calc) — ABNORMAL LOW (ref 250–450)

## 2020-04-29 LAB — CBC
HCT: 38.8 % (ref 35.0–45.0)
Hemoglobin: 12.4 g/dL (ref 11.7–15.5)
MCH: 28.3 pg (ref 27.0–33.0)
MCHC: 32 g/dL (ref 32.0–36.0)
MCV: 88.6 fL (ref 80.0–100.0)
MPV: 10.1 fL (ref 7.5–12.5)
Platelets: 377 10*3/uL (ref 140–400)
RBC: 4.38 10*6/uL (ref 3.80–5.10)
RDW: 12.7 % (ref 11.0–15.0)
WBC: 6.1 10*3/uL (ref 3.8–10.8)

## 2020-04-29 LAB — B12 AND FOLATE PANEL
Folate: 14.6 ng/mL
Vitamin B-12: 382 pg/mL (ref 200–1100)

## 2020-04-29 LAB — VITAMIN D 25 HYDROXY (VIT D DEFICIENCY, FRACTURES): Vit D, 25-Hydroxy: 61 ng/mL (ref 30–100)

## 2020-04-30 LAB — H. PYLORI BREATH TEST: H. pylori Breath Test: NOT DETECTED

## 2020-04-30 NOTE — Progress Notes (Signed)
April Vega,   Vitamin D GREAT.  Kidney, liver, glucose wonderful.  Iron a little low.  Hemoglobin good.

## 2020-05-01 ENCOUNTER — Encounter: Payer: Self-pay | Admitting: Physician Assistant

## 2020-05-01 NOTE — Progress Notes (Signed)
April Vega news h. Pylori is negative. Next week let me know if omeprazole is helping the bad taste in your mouth.

## 2020-05-03 ENCOUNTER — Encounter: Payer: Self-pay | Admitting: Physician Assistant

## 2020-05-23 ENCOUNTER — Other Ambulatory Visit: Payer: Self-pay | Admitting: Physician Assistant

## 2020-05-23 DIAGNOSIS — R438 Other disturbances of smell and taste: Secondary | ICD-10-CM

## 2020-05-23 DIAGNOSIS — K2900 Acute gastritis without bleeding: Secondary | ICD-10-CM

## 2020-08-16 ENCOUNTER — Other Ambulatory Visit: Payer: Self-pay | Admitting: Physician Assistant

## 2020-08-16 DIAGNOSIS — K2900 Acute gastritis without bleeding: Secondary | ICD-10-CM

## 2020-08-16 DIAGNOSIS — R438 Other disturbances of smell and taste: Secondary | ICD-10-CM

## 2020-09-16 ENCOUNTER — Ambulatory Visit (INDEPENDENT_AMBULATORY_CARE_PROVIDER_SITE_OTHER): Payer: Federal, State, Local not specified - PPO | Admitting: Family Medicine

## 2020-09-16 ENCOUNTER — Other Ambulatory Visit: Payer: Self-pay

## 2020-09-16 DIAGNOSIS — M2170 Unequal limb length (acquired), unspecified site: Secondary | ICD-10-CM

## 2020-09-16 NOTE — Progress Notes (Signed)
April Vega - 49 y.o. female MRN [id number]  Date of birth: Sep 02, 1971  SUBJECTIVE:  Including CC & ROS.  No chief complaint on file.   April Vega is a 49 y.o. female that is presenting with left leg pain and a left leg discrepancy.  She has orthotics made previously with [facility] to help accommodate with walking.   Review of Systems See HPI   HISTORY: Past Medical, Surgical, Social, and Family History Reviewed & Updated per EMR.   Pertinent Historical Findings include:  Past Medical History:  Diagnosis Date  . ADHD (attention deficit hyperactivity disorder)   . Anemia   . DVT (deep venous thrombosis) ([place])   . 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 Menometrorrhagia 01/02/2016    Past Surgical History:  Procedure Laterality Date  . bone graph  01/1990  . CESAREAN SECTION    . INTRAVASCULAR ULTRASOUND/IVUS Left 05/12/2017   Procedure: Intravascular Ultrasound/IVUS;  Surgeon: Waynetta Sandy, MD;  Location: [facility];  Service: Cardiovascular;  Laterality: Left;  IVC TO LT POPLITEAL VEIN  . INTRAVASCULAR ULTRASOUND/IVUS N/A 03/07/2018   Procedure: INTRAVASCULAR ULTRASOUND/IVUS;  Surgeon: Waynetta Sandy, MD;  Location: [facility];  Service: Cardiovascular;  Laterality: N/A;  . LOWER EXTREMITY VENOGRAPHY Bilateral 05/11/2017   Procedure: Bilateral Lower Extremity Venography;  Surgeon: Serafina Mitchell, MD;  Location: [facility];  Service: Cardiovascular;  Laterality: Bilateral;  . LOWER EXTREMITY VENOGRAPHY N/A 03/07/2018   Procedure: LOWER EXTREMITY VENOGRAPHY;  Surgeon: Waynetta Sandy, MD;  Location: [facility];  Service: Cardiovascular;  Laterality: N/A;  . PERIPHERAL VASCULAR BALLOON ANGIOPLASTY Left 03/07/2018   Procedure: PERIPHERAL VASCULAR BALLOON ANGIOPLASTY;  Surgeon: Waynetta Sandy, MD;  Location: [facility];  Service: Cardiovascular;  Laterality: Left;  left common iliac vein   . PERIPHERAL VASCULAR INTERVENTION Left 05/12/2017   Procedure: PERIPHERAL VASCULAR INTERVENTION;  Surgeon: Waynetta Sandy, MD;  Location: [facility];  Service: Cardiovascular;  Laterality: Left;  IVC TO LT COMMON FEM VEIN  STENT  . PERIPHERAL VASCULAR INTERVENTION Right 03/07/2018   Procedure: PERIPHERAL VASCULAR INTERVENTION;  Surgeon: Waynetta Sandy, MD;  Location: [facility];  Service: Cardiovascular;  Laterality: Right;  right common iliac vein  . PERIPHERAL VASCULAR THROMBECTOMY Left 05/11/2017   Procedure: PERIPHERAL VASCULAR THROMBECTOMY;  Surgeon: Serafina Mitchell, MD;  Location: [facility];  Service: Cardiovascular;  Laterality: Left;  left lower extremity venous    Family History  Problem Relation Age of Onset  . Diabetes Mother   . Hypertension Mother     Social History   Socioeconomic History  . Marital status: Married    Spouse name: Not on file  . Number of children: Not on file  . Years of education: Not on file  . Highest education level: Not on file  Occupational History  . Occupation: Pharmacist, hospital  Tobacco Use  . Smoking status: Never Smoker  . Smokeless tobacco: Never Used  Vaping Use  . Vaping Use: Never used  Substance and Sexual Activity  . Alcohol use: No    Alcohol/week: 0.0 standard drinks  . Drug use: No  . Sexual activity: Yes    Partners: Male    Comment: Occ use condoms  Other Topics Concern  . Not on file  Social History Narrative  . Not on file   Social Determinants of Health   Financial Resource Strain: Not on file  Food Insecurity: Not on  file  Transportation Needs: Not on file  Physical Activity: Not on file  Stress: Not on file  Social Connections: Not on file  Intimate Partner Violence: Not on file     PHYSICAL EXAM:  VS: BP 107/72   Ht 5\' 4"  (1.626 m)   Wt 130 lb (59 kg)   BMI 22.31 kg/m  Physical Exam Gen: NAD, alert, cooperative with exam, well-appearing MSK:  Left  leg: Well-healed vertical wound on the anterior left tibia. Significant leg length discrepancy compared to the right. Neurovascular intact  Patient was fitted for a standard, cushioned, semi-rigid orthotic. The orthotic was heated and afterward the patient stood on the orthotic blank positioned on the orthotic stand. The patient was positioned in subtalar neutral position and 10 degrees of ankle dorsiflexion in a weight bearing stance. After completion of molding, a stable base was applied to the orthotic blank. The blank was ground to a stable position for weight bearing. Size: 46M Pairs: 2 Base: Blue EVA Additional Posting and Padding: 7/16" heel lift long. 5/16" heel lift short  The patient ambulated these, and they were very comfortable.    ASSESSMENT & PLAN:   Acquired leg length discrepancy Left sided leg discrepancy from previous trauma.  Has done well with previous orthotics. -Counseled on home exercise therapy and supportive care. -Orthotics. -Could consider additional lifts if needed.
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
## 2020-10-01 ENCOUNTER — Other Ambulatory Visit: Payer: Self-pay | Admitting: Physician Assistant

## 2020-10-01 DIAGNOSIS — E559 Vitamin D deficiency, unspecified: Secondary | ICD-10-CM

## 2020-10-02 ENCOUNTER — Telehealth: Payer: Self-pay | Admitting: *Deleted

## 2020-10-02 NOTE — Telephone Encounter (Signed)
Returned call from 4:07 PM. Left patient a message to call or MyChart message to reschedule appointment scheduled for 10/07/2020. Office will cancel at this time.

## 2020-10-07 ENCOUNTER — Ambulatory Visit: Payer: Federal, State, Local not specified - PPO | Admitting: Obstetrics and Gynecology

## 2020-10-10 ENCOUNTER — Ambulatory Visit (INDEPENDENT_AMBULATORY_CARE_PROVIDER_SITE_OTHER): Payer: Federal, State, Local not specified - PPO | Admitting: Obstetrics and Gynecology

## 2020-10-10 ENCOUNTER — Other Ambulatory Visit (HOSPITAL_COMMUNITY)
Admission: RE | Admit: 2020-10-10 | Discharge: 2020-10-10 | Disposition: A | Discharge status: Home / Self Care | Payer: Federal, State, Local not specified - PPO | Source: Ambulatory Visit | Attending: Obstetrics and Gynecology | Admitting: Obstetrics and Gynecology

## 2020-10-10 ENCOUNTER — Other Ambulatory Visit: Payer: Self-pay

## 2020-10-10 ENCOUNTER — Encounter: Payer: Self-pay | Admitting: Obstetrics and Gynecology

## 2020-10-10 VITALS — BP 106/64 | HR 81 | Resp 16 | Ht 62.0 in | Wt 121.0 lb

## 2020-10-10 DIAGNOSIS — Z01419 Encounter for gynecological examination (general) (routine) without abnormal findings: Secondary | ICD-10-CM

## 2020-10-10 DIAGNOSIS — Z113 Encounter for screening for infections with a predominantly sexual mode of transmission: Secondary | ICD-10-CM | POA: Insufficient documentation

## 2020-10-10 DIAGNOSIS — Z9889 Other specified postprocedural states: Secondary | ICD-10-CM

## 2020-10-10 DIAGNOSIS — Z124 Encounter for screening for malignant neoplasm of cervix: Secondary | ICD-10-CM

## 2020-10-10 DIAGNOSIS — Z862 Personal history of diseases of the blood and blood-forming organs and certain disorders involving the immune mechanism: Secondary | ICD-10-CM | POA: Diagnosis not present

## 2020-10-10 DIAGNOSIS — N898 Other specified noninflammatory disorders of vagina: Secondary | ICD-10-CM

## 2020-10-10 NOTE — Progress Notes (Signed)
GYNECOLOGY ANNUAL PREVENTATIVE CARE ENCOUNTER NOTE  Subjective:   April Vega is a 50 y.o. G8P2012 female here for a annual gynecologic exam. Current complaints: occasional vaginal odor. Had ablation at [facility] and no bleeding since, very happy with it.    Denies abnormal vaginal bleeding, discharge, pelvic pain, problems with intercourse or other gynecologic concerns. Accepts STI screen.   Gynecologic History No LMP recorded. Patient has had an ablation. Contraception: none, s/p endometrial ablation age 53 Last Pap: 2019. Results: normal Last mammogram: 02/2019. Results: [place] 2 DEXA: has never had  Obstetric History OB History  Gravida Para Term Preterm AB Living  3 2 2   1 2   SAB IAB Ectopic Multiple Live Births  1            # Outcome Date GA Lbr Len/2nd Weight Sex Delivery Anes PTL Lv  3 SAB           2 Term           1 Term      CS-Unspec       Past Medical History:  Diagnosis Date  . ADHD (attention deficit hyperactivity disorder)   . Anemia   . Dry eyes due to decreased tear production   . DVT (deep venous thrombosis) ([place])   . 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 Menometrorrhagia 01/02/2016    Past Surgical History:  Procedure Laterality Date  . bone graph  01/1990  . CESAREAN SECTION    . INTRAVASCULAR ULTRASOUND/IVUS Left 05/12/2017   Procedure: Intravascular Ultrasound/IVUS;  Surgeon: Waynetta Sandy, MD;  Location: [facility];  Service: Cardiovascular;  Laterality: Left;  IVC TO LT POPLITEAL VEIN  . INTRAVASCULAR ULTRASOUND/IVUS N/A 03/07/2018   Procedure: INTRAVASCULAR ULTRASOUND/IVUS;  Surgeon: Waynetta Sandy, MD;  Location: [facility];  Service: Cardiovascular;  Laterality: N/A;  . LOWER EXTREMITY VENOGRAPHY Bilateral 05/11/2017   Procedure: Bilateral Lower Extremity Venography;  Surgeon: Serafina Mitchell, MD;  Location: [facility];  Service: Cardiovascular;  Laterality: Bilateral;  . LOWER EXTREMITY  VENOGRAPHY N/A 03/07/2018   Procedure: LOWER EXTREMITY VENOGRAPHY;  Surgeon: Waynetta Sandy, MD;  Location: [facility];  Service: Cardiovascular;  Laterality: N/A;  . PERIPHERAL VASCULAR BALLOON ANGIOPLASTY Left 03/07/2018   Procedure: PERIPHERAL VASCULAR BALLOON ANGIOPLASTY;  Surgeon: Waynetta Sandy, MD;  Location: [facility];  Service: Cardiovascular;  Laterality: Left;  left common iliac vein  . PERIPHERAL VASCULAR INTERVENTION Left 05/12/2017   Procedure: PERIPHERAL VASCULAR INTERVENTION;  Surgeon: Waynetta Sandy, MD;  Location: [facility];  Service: Cardiovascular;  Laterality: Left;  IVC TO LT COMMON FEM VEIN  STENT  . PERIPHERAL VASCULAR INTERVENTION Right 03/07/2018   Procedure: PERIPHERAL VASCULAR INTERVENTION;  Surgeon: Waynetta Sandy, MD;  Location: [facility];  Service: Cardiovascular;  Laterality: Right;  right common iliac vein  . PERIPHERAL VASCULAR THROMBECTOMY Left 05/11/2017   Procedure: PERIPHERAL VASCULAR THROMBECTOMY;  Surgeon: Serafina Mitchell, MD;  Location: [facility];  Service: Cardiovascular;  Laterality: Left;  left lower extremity venous    Current Outpatient Medications on File Prior to Visit  Medication Sig Dispense Refill  . aspirin 81 MG tablet Take by mouth.    . chlorhexidine (PERIDEX) 0.12 % solution SMARTSIG:By Mouth    . Cholecalciferol-Vitamin C (VITAMIN D3-VITAMIN C PO) Take by mouth.    . RESTASIS 0.05 % ophthalmic emulsion     . Vitamin D, Ergocalciferol, (  DRISDOL) 1.25 MG (50000 UNIT) CAPS capsule Take 50,000 Units by mouth once a week.     No current facility-administered medications on file prior to visit.    Allergies  Allergen Reactions  . Wellbutrin [Bupropion] Other (See Comments)    Temporary memory loss  . Latex Rash and Swelling    Social History   Socioeconomic History  . Marital status: Married    Spouse name: Not on file  . Number of children: Not on  file  . Years of education: Not on file  . Highest education level: Not on file  Occupational History  . Occupation: Pharmacist, hospital  Tobacco Use  . Smoking status: Never Smoker  . Smokeless tobacco: Never Used  Vaping Use  . Vaping Use: Never used  Substance and Sexual Activity  . Alcohol use: No    Alcohol/week: 0.0 standard drinks  . Drug use: No  . Sexual activity: Yes    Partners: Male    Birth control/protection: None    Comment: Occ use condoms  Other Topics Concern  . Not on file  Social History Narrative  . Not on file   Social Determinants of Health   Financial Resource Strain: Not on file  Food Insecurity: Not on file  Transportation Needs: Not on file  Physical Activity: Not on file  Stress: Not on file  Social Connections: Not on file  Intimate Partner Violence: Not on file    Family History  Adopted: Yes   The following portions of the patient's history were reviewed and updated as appropriate: allergies, current medications, past family history, past medical history, past social history, past surgical history and problem list.  Review of Systems Pertinent items are noted in HPI.   Objective:  BP 106/64   Pulse 81   Resp 16   Ht 5\' 2"  (1.575 m)   Wt 121 lb (54.9 kg)   BMI 22.13 kg/m  CONSTITUTIONAL: Well-developed, well-nourished female in no acute distress.  HENT:  Normocephalic, atraumatic, External right and left ear normal. Oropharynx is clear and moist EYES: Conjunctivae and EOM are normal. Pupils are equal, round, and reactive to light. No scleral icterus.  NECK: Normal range of motion, supple, no masses.  Normal thyroid.  SKIN: Skin is warm and dry. No rash noted. Not diaphoretic. No erythema. No pallor. Multiple nevi, sebhorreic keratoses noted NEUROLOGIC: Alert and oriented to person, place, and time. Normal reflexes, muscle tone coordination. No cranial nerve deficit noted. PSYCHIATRIC: Normal mood and affect. Normal behavior. Normal judgment  and thought content. CARDIOVASCULAR: Normal heart rate noted RESPIRATORY: Effort normal, no problems with respiration noted. BREASTS: Symmetric in size. No masses, skin changes, nipple drainage, or lymphadenopathy. ABDOMEN: Soft, no distention noted.  No tenderness, rebound or guarding.  PELVIC: Normal appearing external genitalia; normal appearing vaginal mucosa and cervix.  No abnormal discharge noted.  Pap smear obtained. Pelvic cultures obtained. Normal uterine size, no other palpable masses, no uterine or adnexal tenderness. MUSCULOSKELETAL: Normal range of motion. No tenderness.  No cyanosis, clubbing, or edema.  2+ distal pulses.  Exam done with chaperone present.   Assessment and Plan:   1. H/O iron deficiency anemia - VITAMIN D 25 Hydroxy (Vit-D Deficiency, Fractures) - CBC - Iron,Total/Total Iron Binding Cap - Ferritin - s/p ablation  2. Well woman exam Healthy female exam Encouraged her to see dermatology for preventative exam due to multiple nevi and seborrheic dermatoses  3. Routine screening for STI (sexually transmitted infection) - Cervicovaginal ancillary only  4. Cervical cancer screening - Cytology - PAP( [place])  5. Vaginal odor Discouraged douching - Cervicovaginal ancillary only  6. S/P endometrial ablation No bleeding since   Will follow up results of pap smear/STI screen and manage accordingly. Encouraged improvement in diet and exercise.  COVID vaccine UTD Accepts STI screen. Mammogram UTD Referral for colonoscopy n/a, had cologuard 2020 Flu vaccine UTD DEXA not due based on age  Routine preventative health maintenance measures emphasized. Please refer to After Visit Summary for other counseling recommendations.   Total face-to-face time with patient: 30 minutes. Over 50% of encounter was spent on counseling and coordination of care.   Feliz Beam, MD, [place] for [facility])

## 2020-10-11 ENCOUNTER — Ambulatory Visit: Payer: Federal, State, Local not specified - PPO | Admitting: Obstetrics and Gynecology

## 2020-10-11 LAB — CBC
HCT: 41.1 % (ref 35.0–45.0)
Hemoglobin: 13.3 g/dL (ref 11.7–15.5)
MCH: 28.7 pg (ref 27.0–33.0)
MCHC: 32.4 g/dL (ref 32.0–36.0)
MCV: 88.8 fL (ref 80.0–100.0)
MPV: 10.9 fL (ref 7.5–12.5)
Platelets: 339 10*3/uL (ref 140–400)
RBC: 4.63 10*6/uL (ref 3.80–5.10)
RDW: 12.2 % (ref 11.0–15.0)
WBC: 4.8 10*3/uL (ref 3.8–10.8)

## 2020-10-11 LAB — CERVICOVAGINAL ANCILLARY ONLY
Bacterial Vaginitis (gardnerella): NEGATIVE
Candida Glabrata: NEGATIVE
Candida Vaginitis: NEGATIVE
Chlamydia: NEGATIVE
Comment: NEGATIVE
Comment: NEGATIVE
Comment: NEGATIVE
Comment: NEGATIVE
Comment: NEGATIVE
Comment: NORMAL
Neisseria Gonorrhea: NEGATIVE
Trichomonas: NEGATIVE

## 2020-10-11 LAB — IRON, TOTAL/TOTAL IRON BINDING CAP
%SAT: 24 % (calc) (ref 16–45)
Iron: 44 ug/dL — ABNORMAL LOW (ref 45–160)
TIBC: 184 mcg/dL (calc) — ABNORMAL LOW (ref 250–450)

## 2020-10-11 LAB — CYTOLOGY - PAP
Adequacy: ABSENT
Comment: NEGATIVE
Diagnosis: NEGATIVE
High risk HPV: NEGATIVE

## 2020-10-11 LAB — FERRITIN: Ferritin: 352 ng/mL — ABNORMAL HIGH (ref 16–232)

## 2020-10-15 ENCOUNTER — Encounter: Payer: Self-pay | Admitting: Physician Assistant

## 2020-10-15 DIAGNOSIS — D508 Other iron deficiency anemias: Secondary | ICD-10-CM

## 2020-10-15 DIAGNOSIS — K909 Intestinal malabsorption, unspecified: Secondary | ICD-10-CM

## 2020-10-20 ENCOUNTER — Other Ambulatory Visit: Payer: Self-pay | Admitting: Physician Assistant

## 2020-10-24 ENCOUNTER — Telehealth: Payer: Self-pay

## 2020-10-24 NOTE — Telephone Encounter (Signed)
Pt called in with questions about iv iron, she has not been seen since 2019, pt advised she would need a visit prior to any iv iron tx, appt made   April Vega

## 2020-10-28 NOTE — Telephone Encounter (Signed)
Ok referral placed. Please send last labs and notes from hematology with [facility] that she used to go through.

## 2020-10-31 ENCOUNTER — Encounter: Payer: Self-pay | Admitting: Physician Assistant

## 2020-11-19 ENCOUNTER — Ambulatory Visit: Payer: Federal, State, Local not specified - PPO

## 2020-11-19 ENCOUNTER — Encounter (HOSPITAL_COMMUNITY): Payer: Federal, State, Local not specified - PPO

## 2020-11-22 ENCOUNTER — Ambulatory Visit: Payer: Federal, State, Local not specified - PPO | Admitting: Physician Assistant

## 2020-11-22 ENCOUNTER — Encounter: Payer: Self-pay | Admitting: Physician Assistant

## 2020-11-22 ENCOUNTER — Ambulatory Visit (HOSPITAL_COMMUNITY)
Admission: RE | Admit: 2020-11-22 | Discharge: 2020-11-22 | Disposition: A | Discharge status: Home / Self Care | Payer: Federal, State, Local not specified - PPO | Source: Ambulatory Visit | Attending: Vascular Surgery | Admitting: Vascular Surgery

## 2020-11-22 ENCOUNTER — Ambulatory Visit (INDEPENDENT_AMBULATORY_CARE_PROVIDER_SITE_OTHER): Payer: Federal, State, Local not specified - PPO | Admitting: Family Medicine

## 2020-11-22 ENCOUNTER — Other Ambulatory Visit: Payer: Self-pay

## 2020-11-22 VITALS — BP 122/82 | HR 66 | Temp 98.6°F | Resp 20 | Ht 62.0 in | Wt 121.2 lb

## 2020-11-22 VITALS — BP 112/64 | HR 68 | Ht 64.0 in | Wt 122.0 lb

## 2020-11-22 DIAGNOSIS — Z13220 Encounter for screening for lipoid disorders: Secondary | ICD-10-CM | POA: Diagnosis not present

## 2020-11-22 DIAGNOSIS — Z86718 Personal history of other venous thrombosis and embolism: Secondary | ICD-10-CM | POA: Diagnosis not present

## 2020-11-22 DIAGNOSIS — Z1159 Encounter for screening for other viral diseases: Secondary | ICD-10-CM

## 2020-11-22 DIAGNOSIS — R5383 Other fatigue: Secondary | ICD-10-CM | POA: Diagnosis not present

## 2020-11-22 DIAGNOSIS — Z7982 Long term (current) use of aspirin: Secondary | ICD-10-CM | POA: Diagnosis not present

## 2020-11-22 DIAGNOSIS — D1721 Benign lipomatous neoplasm of skin and subcutaneous tissue of right arm: Secondary | ICD-10-CM

## 2020-11-22 DIAGNOSIS — I82413 Acute embolism and thrombosis of femoral vein, bilateral: Secondary | ICD-10-CM

## 2020-11-22 DIAGNOSIS — R6889 Other general symptoms and signs: Secondary | ICD-10-CM

## 2020-11-22 DIAGNOSIS — K909 Intestinal malabsorption, unspecified: Secondary | ICD-10-CM

## 2020-11-22 DIAGNOSIS — Z23 Encounter for immunization: Secondary | ICD-10-CM | POA: Diagnosis not present

## 2020-11-22 DIAGNOSIS — I871 Compression of vein: Secondary | ICD-10-CM | POA: Diagnosis not present

## 2020-11-22 DIAGNOSIS — D509 Iron deficiency anemia, unspecified: Secondary | ICD-10-CM

## 2020-11-22 DIAGNOSIS — F5089 Other specified eating disorder: Secondary | ICD-10-CM | POA: Diagnosis not present

## 2020-11-22 DIAGNOSIS — L84 Corns and callosities: Secondary | ICD-10-CM

## 2020-11-22 DIAGNOSIS — M2170 Unequal limb length (acquired), unspecified site: Secondary | ICD-10-CM | POA: Diagnosis not present

## 2020-11-22 DIAGNOSIS — M7989 Other specified soft tissue disorders: Secondary | ICD-10-CM

## 2020-11-22 DIAGNOSIS — E559 Vitamin D deficiency, unspecified: Secondary | ICD-10-CM

## 2020-11-22 DIAGNOSIS — D508 Other iron deficiency anemias: Secondary | ICD-10-CM | POA: Diagnosis not present

## 2020-11-22 DIAGNOSIS — Z98891 History of uterine scar from previous surgery: Secondary | ICD-10-CM | POA: Diagnosis not present

## 2020-11-22 DIAGNOSIS — Z1211 Encounter for screening for malignant neoplasm of colon: Secondary | ICD-10-CM

## 2020-11-22 DIAGNOSIS — Z79899 Other long term (current) drug therapy: Secondary | ICD-10-CM | POA: Diagnosis not present

## 2020-11-22 NOTE — Progress Notes (Signed)
Patient has a leg length discrepency on the left that required 2 medium 7/16 three quarter heel wedges with two 5/16 heel lifts. Pt wanted 2 of this combination so that she could use them in her dress shoes. Patient tried on the combination and confirmed this was confortable and was to her liking.

## 2020-11-22 NOTE — Assessment & Plan Note (Signed)
Has been doing well with adjustments made to previous orthotics.  Would like other adjustments made. -Counseled on home exercise therapy and supportive care. -Provided other heel lifts
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
## 2020-11-22 NOTE — Progress Notes (Signed)
Office Note     CC:  follow up Requesting Provider:  Donella Stade, PA-C  HPI: April Vega is a 50 y.o. 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  She subsequently required stenting on the right side with balloon angioplasty on the left.  She currently denies lower extremity pain or swelling.  She states she needs new compressive stockings today.  She is compliant with 81 mg aspirin daily   Past Medical History:  Diagnosis Date  . ADHD (attention deficit hyperactivity disorder)   . Anemia   . Dry eyes due to decreased tear production   . DVT (deep venous thrombosis) ([place])   . 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 Menometrorrhagia 01/02/2016    Past Surgical History:  Procedure Laterality Date  . bone graph  01/1990  . CESAREAN SECTION    . ENDOMETRIAL ABLATION W/ NOVASURE  2019  . INTRAVASCULAR ULTRASOUND/IVUS Left 05/12/2017   Procedure: Intravascular Ultrasound/IVUS;  Surgeon: Waynetta Sandy, MD;  Location: [facility];  Service: Cardiovascular;  Laterality: Left;  IVC TO LT POPLITEAL VEIN  . INTRAVASCULAR ULTRASOUND/IVUS N/A 03/07/2018   Procedure: INTRAVASCULAR ULTRASOUND/IVUS;  Surgeon: Waynetta Sandy, MD;  Location: [facility];  Service: Cardiovascular;  Laterality: N/A;  . LOWER EXTREMITY VENOGRAPHY Bilateral 05/11/2017   Procedure: Bilateral Lower Extremity Venography;  Surgeon: Serafina Mitchell, MD;  Location: [facility];  Service: Cardiovascular;  Laterality: Bilateral;  . LOWER EXTREMITY VENOGRAPHY N/A 03/07/2018   Procedure: LOWER EXTREMITY VENOGRAPHY;  Surgeon: Waynetta Sandy, MD;  Location: [facility];  Service: Cardiovascular;  Laterality: N/A;  . PERIPHERAL VASCULAR BALLOON ANGIOPLASTY Left 03/07/2018   Procedure: PERIPHERAL VASCULAR BALLOON ANGIOPLASTY;  Surgeon: Waynetta Sandy, MD;  Location: [facility];  Service: Cardiovascular;  Laterality: Left;  left common iliac vein  . PERIPHERAL VASCULAR INTERVENTION Left 05/12/2017   Procedure: PERIPHERAL VASCULAR INTERVENTION;  Surgeon: Waynetta Sandy, MD;  Location: [facility];  Service: Cardiovascular;  Laterality: Left;  IVC TO LT COMMON FEM VEIN  STENT  . PERIPHERAL VASCULAR INTERVENTION Right 03/07/2018   Procedure: PERIPHERAL VASCULAR INTERVENTION;  Surgeon: Waynetta Sandy, MD;  Location: [facility];  Service: Cardiovascular;  Laterality: Right;  right common iliac vein  . PERIPHERAL VASCULAR THROMBECTOMY Left 05/11/2017   Procedure: PERIPHERAL VASCULAR THROMBECTOMY;  Surgeon: Serafina Mitchell, MD;  Location: [facility];  Service: Cardiovascular;  Laterality: Left;  left lower extremity venous    Social History   Socioeconomic History  . Marital status: Married    Spouse name: Not on file  . Number of children: Not on file  . Years of education: Not on file  . Highest education level: Not on file  Occupational History  . Occupation: Pharmacist, hospital  Tobacco Use  . Smoking status: Never Smoker  . Smokeless tobacco: Never Used  Vaping Use  . Vaping Use: Never used  Substance and Sexual Activity  . Alcohol use: No    Alcohol/week: 0.0 standard drinks  . Drug use: No  . Sexual activity: Yes    Partners: Male    Birth control/protection: None    Comment: Occ use condoms  Other Topics Concern  . Not on file  Social History Narrative  . Not on file   Social Determinants of Health   Financial Resource Strain: Not on file  Food Insecurity: Not on  file  Transportation Needs: Not on file  Physical Activity: Not on file  Stress: Not on file  Social Connections: Not on file  Intimate Partner Violence: Not on file   Family History  Adopted: Yes    Current Outpatient Medications  Medication Sig Dispense Refill  . aspirin 81 MG tablet Take by mouth.    . ferrous sulfate 325 (65  FE) MG EC tablet Take 325 mg by mouth 3 (three) times daily with meals.    . RESTASIS 0.05 % ophthalmic emulsion     . Vitamin D, Ergocalciferol, (DRISDOL) 1.25 MG (50000 UNIT) CAPS capsule TAKE 1 CAPSULE BY MOUTH EVERY 7 DAYS 12 capsule 3   No current facility-administered medications for this visit.    Allergies  Allergen Reactions  . Wellbutrin [Bupropion] Other (See Comments)    Temporary memory loss  . Latex Rash and Swelling     REVIEW OF SYSTEMS:   [X]  denotes positive finding, [ ]  denotes negative finding Cardiac  Comments:  Chest pain or chest pressure:    Shortness of breath upon exertion:    Short of breath when lying flat:    Irregular heart rhythm:        Vascular    Pain in calf, thigh, or hip brought on by ambulation:    Pain in feet at night that wakes you up from your sleep:     Blood clot in your veins:    Leg swelling:         Pulmonary    Oxygen at home:    Productive cough:     Wheezing:         Neurologic    Sudden weakness in arms or legs:     Sudden numbness in arms or legs:     Sudden onset of difficulty speaking or slurred speech:    Temporary loss of vision in one eye:     Problems with dizziness:         Gastrointestinal    Blood in stool:     Vomited blood:         Genitourinary    Burning when urinating:     Blood in urine:        Psychiatric    Major depression:         Hematologic    Bleeding problems:    Problems with blood clotting too easily:        Skin    Rashes or ulcers:        Constitutional    Fever or chills:      PHYSICAL EXAMINATION:  Vitals:   11/22/20 0838  BP: 122/82  Pulse: 66  Resp: 20  Temp: 98.6 F (37 C)  TempSrc: Temporal  SpO2: 100%  Weight: 121 lb 3.2 oz (55 kg)  Height: 5\' 2"  (1.575 m)    General:  WDWN in NAD; vital signs documented above Gait: Not observed HENT: WNL, normocephalic Pulmonary: normal non-labored breathing , without Rales, rhonchi,  wheezing Cardiac: regular  HR, Skin: without rashes Vascular Exam/Pulses: 2+ right DP pulse.  1+ left PT pulse Extremities: without ischemic changes, without Gangrene , without cellulitis; without open wounds; well-healed left lower leg anterior incision from prior traumatic injury.  Both feet are warm and well perfused.  Motor function and sensation are intact.  No lower extremity edema. Musculoskeletal: no muscle wasting or atrophy  Neurologic: A&O X 3;  No focal weakness or paresthesias are detected Psychiatric:  The pt  has Normal affect.   Non-Invasive Vascular Imaging:   11/22/2020 IVC/Iliac: Patent right CIV stent. Patent left CIV, EIV, and CFV stents.     ASSESSMENT/PLAN:: 50 y.o. female here for follow up for bilateral common iliac vein stent placement as well as left external iliac vein and common femoral vein stent placement.  She is asymptomatic. Continue daily aspirin Fit her for thigh-high compression stockings today. Follow up in 1 year with venous duplex of IVC and bilateral iliac veins.  Barbie Banner, PA-C [facility] [phone]  Clinic MD:   Dr. Stanford Breed on-call

## 2020-11-22 NOTE — Patient Instructions (Signed)
Corns and Calluses Corns are small areas of thickened skin that form on the top, sides, or tip of a toe. Corns have a cone-shaped core with a point that can press on a nerve below. This causes pain. Calluses are areas of thickened skin that can form anywhere on the body, including the hands, fingers, palms, soles of the feet, and heels. Calluses are usually larger than corns. What are the causes? Corns and calluses are caused by rubbing (friction) or pressure, such as from shoes that are too tight or do not fit properly. What increases the risk? Corns are more likely to develop in people who have misshapen toes (toe deformities), such as hammer toes. Calluses can form with friction to any area of the skin. They are more likely to develop in people who:  Work with their hands.  Wear shoes that fit poorly, are too tight, or are high-heeled.  Have toe deformities. What are the signs or symptoms? Symptoms of a corn or callus include:  A hard growth on the skin.  Pain or tenderness under the skin.  Redness and swelling.  Increased discomfort while wearing tight-fitting shoes, if your feet are affected. If a corn or callus becomes infected, symptoms may include:  Redness and swelling that gets worse.  Pain.  Fluid, blood, or pus draining from the corn or callus.   How is this diagnosed? Corns and calluses may be diagnosed based on your symptoms, your medical history, and a physical exam. How is this treated? Treatment for corns and calluses may include:  Removing the cause of the friction or pressure. This may involve: ? Changing your shoes. ? Wearing shoe inserts (orthotics) or other protective layers in your shoes, such as a corn pad. ? Wearing gloves.  Applying medicine to the skin (topical medicine) to help soften skin in the hardened, thickened areas.  Removing layers of dead skin with a file to reduce the size of the corn or callus.  Removing the corn or callus with a  scalpel or laser.  Taking antibiotic medicines, if your corn or callus is infected.  Having surgery, if a toe deformity is the cause. Follow these instructions at home:  Take over-the-counter and prescription medicines only as told by your health care provider.  If you were prescribed an antibiotic medicine, take it as told by your health care provider. Do not stop taking it even if your condition improves.  Wear shoes that fit well. Avoid wearing high-heeled shoes and shoes that are too tight or too loose.  Wear any padding, protective layers, gloves, or orthotics as told by your health care provider.  Soak your hands or feet. Then use a file or pumice stone to soften your corn or callus. Do this as told by your health care provider.  Check your corn or callus every day for signs of infection.   Contact a health care provider if:  Your symptoms do not improve with treatment.  You have redness or swelling that gets worse.  Your corn or callus becomes painful.  You have fluid, blood, or pus coming from your corn or callus.  You have new symptoms. Get help right away if:  You develop severe pain with redness. Summary  Corns are small areas of thickened skin that form on the top, sides, or tip of a toe. These can be painful.  Calluses are areas of thickened skin that can form anywhere on the body, including the hands, fingers, palms, and soles of the   feet. Calluses are usually larger than corns.  Corns and calluses are caused by rubbing (friction) or pressure, such as from shoes that are too tight or do not fit properly.  Treatment may include wearing padding, protective layers, gloves, or orthotics as told by your health care provider. This information is not intended to replace advice given to you by your health care provider. Make sure you discuss any questions you have with your health care provider. Document Revised: 01/04/2020 Document Reviewed: 01/04/2020 Elsevier  Patient Education  2021 Elsevier Inc.  

## 2020-11-22 NOTE — Progress Notes (Signed)
April Vega - 50 y.o. female MRN [id number]  Date of birth: 09-May-1971  SUBJECTIVE:  Including CC & ROS.  No chief complaint on file.   April Vega is a 50 y.o. female that is following up for leg length discrepancy.  She has been doing well with no significant pain.   Review of Systems See HPI   HISTORY: Past Medical, Surgical, Social, and Family History Reviewed & Updated per EMR.   Pertinent Historical Findings include:  Past Medical History:  Diagnosis Date  . ADHD (attention deficit hyperactivity disorder)   . Anemia   . Dry eyes due to decreased tear production   . DVT (deep venous thrombosis) ([place])   . 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 Menometrorrhagia 01/02/2016    Past Surgical History:  Procedure Laterality Date  . bone graph  01/1990  . CESAREAN SECTION    . ENDOMETRIAL ABLATION W/ NOVASURE  2019  . INTRAVASCULAR ULTRASOUND/IVUS Left 05/12/2017   Procedure: Intravascular Ultrasound/IVUS;  Surgeon: Waynetta Sandy, MD;  Location: [facility];  Service: Cardiovascular;  Laterality: Left;  IVC TO LT POPLITEAL VEIN  . INTRAVASCULAR ULTRASOUND/IVUS N/A 03/07/2018   Procedure: INTRAVASCULAR ULTRASOUND/IVUS;  Surgeon: Waynetta Sandy, MD;  Location: [facility];  Service: Cardiovascular;  Laterality: N/A;  . LOWER EXTREMITY VENOGRAPHY Bilateral 05/11/2017   Procedure: Bilateral Lower Extremity Venography;  Surgeon: Serafina Mitchell, MD;  Location: [facility];  Service: Cardiovascular;  Laterality: Bilateral;  . LOWER EXTREMITY VENOGRAPHY N/A 03/07/2018   Procedure: LOWER EXTREMITY VENOGRAPHY;  Surgeon: Waynetta Sandy, MD;  Location: [facility];  Service: Cardiovascular;  Laterality: N/A;  . PERIPHERAL VASCULAR BALLOON ANGIOPLASTY Left 03/07/2018   Procedure: PERIPHERAL VASCULAR BALLOON ANGIOPLASTY;  Surgeon: Waynetta Sandy, MD;  Location: [facility];  Service: Cardiovascular;   Laterality: Left;  left common iliac vein  . PERIPHERAL VASCULAR INTERVENTION Left 05/12/2017   Procedure: PERIPHERAL VASCULAR INTERVENTION;  Surgeon: Waynetta Sandy, MD;  Location: [facility];  Service: Cardiovascular;  Laterality: Left;  IVC TO LT COMMON FEM VEIN  STENT  . PERIPHERAL VASCULAR INTERVENTION Right 03/07/2018   Procedure: PERIPHERAL VASCULAR INTERVENTION;  Surgeon: Waynetta Sandy, MD;  Location: [facility];  Service: Cardiovascular;  Laterality: Right;  right common iliac vein  . PERIPHERAL VASCULAR THROMBECTOMY Left 05/11/2017   Procedure: PERIPHERAL VASCULAR THROMBECTOMY;  Surgeon: Serafina Mitchell, MD;  Location: [facility];  Service: Cardiovascular;  Laterality: Left;  left lower extremity venous    Family History  Adopted: Yes    Social History   Socioeconomic History  . Marital status: Married    Spouse name: Not on file  . Number of children: Not on file  . Years of education: Not on file  . Highest education level: Not on file  Occupational History  . Occupation: Pharmacist, hospital  Tobacco Use  . Smoking status: Never Smoker  . Smokeless tobacco: Never Used  Vaping Use  . Vaping Use: Never used  Substance and Sexual Activity  . Alcohol use: No    Alcohol/week: 0.0 standard drinks  . Drug use: No  . Sexual activity: Yes    Partners: Male    Birth control/protection: None    Comment: Occ use condoms  Other Topics Concern  . Not on file  Social History Narrative  . Not on file   Social Determinants of Health   Financial Resource Strain: Not on file  Food Insecurity: Not on file  Transportation Needs: Not on file  Physical Activity: Not on file  Stress: Not on file  Social Connections: Not on file  Intimate Partner Violence: Not on file     PHYSICAL EXAM:  VS: Ht 5\' 4"  (1.626 m)   BMI 20.80 kg/m  Physical Exam Gen: NAD, alert, cooperative with exam, well-appearing   ASSESSMENT & PLAN:   Acquired leg  length discrepancy Has been doing well with adjustments made to previous orthotics.  Would like other adjustments made. -Counseled on home exercise therapy and supportive care. -Provided other heel lifts

## 2020-11-25 ENCOUNTER — Encounter: Payer: Self-pay | Admitting: Physician Assistant

## 2020-11-25 ENCOUNTER — Other Ambulatory Visit: Payer: Self-pay | Admitting: Physician Assistant

## 2020-11-25 DIAGNOSIS — Z1159 Encounter for screening for other viral diseases: Secondary | ICD-10-CM | POA: Diagnosis not present

## 2020-11-25 DIAGNOSIS — R6889 Other general symptoms and signs: Secondary | ICD-10-CM | POA: Diagnosis not present

## 2020-11-25 DIAGNOSIS — Z13220 Encounter for screening for lipoid disorders: Secondary | ICD-10-CM | POA: Diagnosis not present

## 2020-11-25 LAB — LIPID PANEL W/REFLEX DIRECT LDL
Cholesterol: 183 mg/dL (ref <200)
HDL: 68 mg/dL (ref >=50)
LDL Cholesterol (Calc): 101 mg/dL (calc) — ABNORMAL HIGH
Non-HDL Cholesterol (Calc): 115 mg/dL (calc) (ref <130)
Total CHOL/HDL Ratio: 2.7 (calc) (ref <5.0)
Triglycerides: 49 mg/dL (ref <150)

## 2020-11-25 LAB — HEPATITIS C ANTIBODY
Hepatitis C Ab: NONREACTIVE
SIGNAL TO CUT-OFF: 0.02 (ref <1.00)

## 2020-11-26 NOTE — Progress Notes (Signed)
Eldana,   Cholesterol looks great! Perfect HDL and good LDL.  Hep C negative.

## 2020-11-27 ENCOUNTER — Encounter: Payer: Self-pay | Admitting: Physician Assistant

## 2020-11-28 ENCOUNTER — Encounter: Payer: Self-pay | Admitting: Physician Assistant

## 2020-11-28 NOTE — Telephone Encounter (Signed)
Sent referral to [facility] they will call and schedule with patient for good appointment time. - CF

## 2020-12-05 ENCOUNTER — Telehealth: Payer: Self-pay | Admitting: *Deleted

## 2020-12-05 NOTE — Telephone Encounter (Signed)
Patient wishes to cancel for now. She said that she will call back to reschedule.

## 2020-12-10 ENCOUNTER — Encounter: Payer: Self-pay | Admitting: Physician Assistant

## 2020-12-11 NOTE — Telephone Encounter (Signed)
[facility] for referral in [place] for GI for colonoscopy.

## 2020-12-12 NOTE — Telephone Encounter (Signed)
I resent referral to [facility] due to them stating they did not receive it. I faxed it from my fax and the fax up front. I will call them tomorrow to make sure they received the fax and to get patient scheduled. - CF

## 2020-12-13 ENCOUNTER — Other Ambulatory Visit: Payer: Federal, State, Local not specified - PPO

## 2020-12-13 ENCOUNTER — Ambulatory Visit: Payer: Federal, State, Local not specified - PPO | Admitting: Family

## 2020-12-30 DIAGNOSIS — D1721 Benign lipomatous neoplasm of skin and subcutaneous tissue of right arm: Secondary | ICD-10-CM | POA: Diagnosis not present

## 2020-12-30 DIAGNOSIS — Z189 Retained foreign body fragments, unspecified material: Secondary | ICD-10-CM | POA: Diagnosis not present

## 2021-01-01 ENCOUNTER — Ambulatory Visit (INDEPENDENT_AMBULATORY_CARE_PROVIDER_SITE_OTHER): Payer: Federal, State, Local not specified - PPO | Admitting: Physician Assistant

## 2021-01-01 ENCOUNTER — Other Ambulatory Visit: Payer: Self-pay

## 2021-01-01 VITALS — BP 113/77 | HR 66 | Ht 64.0 in | Wt 127.0 lb

## 2021-01-01 DIAGNOSIS — E538 Deficiency of other specified B group vitamins: Secondary | ICD-10-CM | POA: Diagnosis not present

## 2021-01-01 DIAGNOSIS — K909 Intestinal malabsorption, unspecified: Secondary | ICD-10-CM

## 2021-01-01 DIAGNOSIS — R6889 Other general symptoms and signs: Secondary | ICD-10-CM | POA: Insufficient documentation

## 2021-01-01 DIAGNOSIS — Z1329 Encounter for screening for other suspected endocrine disorder: Secondary | ICD-10-CM

## 2021-01-01 DIAGNOSIS — Z0000 Encounter for general adult medical examination without abnormal findings: Secondary | ICD-10-CM | POA: Diagnosis not present

## 2021-01-01 DIAGNOSIS — Z23 Encounter for immunization: Secondary | ICD-10-CM

## 2021-01-01 NOTE — Progress Notes (Signed)
Subjective:     April Vega is a 50 y.o. female and is here for a comprehensive physical exam. The patient reports problems - continues to have fatigue issues which she thinks is related to ongoing anemia problem. she also mentions weight flucuations daily.    Social History   Socioeconomic History  . Marital status: Married    Spouse name: Not on file  . Number of children: Not on file  . Years of education: Not on file  . Highest education level: Not on file  Occupational History  . Occupation: Pharmacist, hospital  Tobacco Use  . Smoking status: Never Smoker  . Smokeless tobacco: Never Used  Vaping Use  . Vaping Use: Never used  Substance and Sexual Activity  . Alcohol use: No    Alcohol/week: 0.0 standard drinks  . Drug use: No  . Sexual activity: Yes    Partners: Male    Birth control/protection: None    Comment: Occ use condoms  Other Topics Concern  . Not on file  Social History Narrative  . Not on file   Social Determinants of Health   Financial Resource Strain: Not on file  Food Insecurity: Not on file  Transportation Needs: Not on file  Physical Activity: Not on file  Stress: Not on file  Social Connections: Not on file  Intimate Partner Violence: Not on file   Health Maintenance  Topic Date Due  . COVID-19 Vaccine (4 - Booster for Pfizer series) 01/10/2021  . MAMMOGRAM  03/08/2021  . INFLUENZA VACCINE  04/21/2021  . Fecal DNA (Cologuard)  02/09/2022  . PAP SMEAR-Modifier  10/10/2025  . TETANUS/TDAP  11/23/2030  . Hepatitis C Screening  Completed  . HIV Screening  Completed  . HPV VACCINES  Aged Out    The following portions of the patient's history were reviewed and updated as appropriate: allergies, current medications, past family history, past medical history, past social history, past surgical history and problem list.  Review of Systems A comprehensive review of systems was negative.   Objective:    BP 113/77   Pulse 66   Ht 5\' 4"  (1.626  m)   Wt 127 lb (57.6 kg)   SpO2 100%   BMI 21.80 kg/m  General appearance: alert, cooperative and appears stated age Head: Normocephalic, without obvious abnormality, atraumatic Eyes: conjunctivae/corneas clear. PERRL, EOM's intact. Fundi benign. Ears: normal TM's and external ear canals both ears Nose: Nares normal. Septum midline. Mucosa normal. No drainage or sinus tenderness. Throat: lips, mucosa, and tongue normal; teeth and gums normal Neck: no adenopathy, no carotid bruit, no JVD, supple, symmetrical, trachea midline and thyroid not enlarged, symmetric, no tenderness/mass/nodules Back: symmetric, no curvature. ROM normal. No CVA tenderness. Lungs: clear to auscultation bilaterally Heart: regular rate and rhythm, S1, S2 normal, no murmur, click, rub or gallop Abdomen: soft, non-tender; bowel sounds normal; no masses,  no organomegaly Extremities: extremities normal, atraumatic, no cyanosis or edema Pulses: 2+ and symmetric Skin: Skin color, texture, turgor normal. No rashes or lesions Lymph nodes: Cervical, supraclavicular, and axillary nodes normal. Neurologic: Alert and oriented X 3, normal strength and tone. Normal symmetric reflexes. Normal coordination and gait   .[name] Depression screen [facility] 2/9 01/01/2021 08/02/2019 05/02/2018 04/11/2018 01/04/2018  Decreased Interest 0 2 0 0 2  Down, Depressed, Hopeless 0 0 0 0 0  PHQ - 2 Score 0 2 0 0 2  Altered sleeping - 2 1 1 2   Tired, decreased energy - 0 2 2 2  Change in appetite - 0 2 2 0  Feeling bad or failure about yourself  - 0 0 0 0  Trouble concentrating - 3 2 2 2   Moving slowly or fidgety/restless - 3 0 0 0  Suicidal thoughts - 0 0 0 0  PHQ-9 Score - 10 7 7 8   Difficult doing work/chores - Not difficult at all Somewhat difficult Somewhat difficult Not difficult at all  Some recent data might be hidden    Assessment:    Healthy female exam.  Plan:    [name] KitchenKeundra was seen today for annual exam.  Diagnoses and all orders for  this visit:  Routine physical examination -     COMPLETE METABOLIC PANEL WITH GFR -     Fe+TIBC+Fer -     CBC -     TSH + free T4 -     B12 and Folate Panel -     Vitamin B6  Need for shingles vaccine -     Varicella-zoster vaccine IM  Iron malabsorption -     Fe+TIBC+Fer -     CBC  B12 deficiency -     B12 and Folate Panel -     Vitamin B6  Thyroid disorder screen  Fluctuation of weight   Discussed 150 minutes of exercise a week.  Encouraged vitamin D 1000 units and Calcium 1300mg  or 4 servings of dairy a day.  PHQ 9 WNL. Fasting labs ordered.  Cologuard UTD.  Mammogram UTD.  Pap smear UTD.  shingrix first shot given today. 2-6 months next shot.  covid vaccine UTD. Flu vaccine UTD.   Discussed fluctuation of weight likely being water weight and linked to salt intake. Try to keep salt intake stable.   See After Visit Summary for Counseling Recommendations

## 2021-01-01 NOTE — Patient Instructions (Signed)
Health Maintenance, Female Adopting a healthy lifestyle and getting preventive care are important in promoting health and wellness. Ask your health care provider about:  The right schedule for you to have regular tests and exams.  Things you can do on your own to prevent diseases and keep yourself healthy. What should I know about diet, weight, and exercise? Eat a healthy diet  Eat a diet that includes plenty of vegetables, fruits, low-fat dairy products, and lean protein.  Do not eat a lot of foods that are high in solid fats, added sugars, or sodium.   Maintain a healthy weight Body mass index (BMI) is used to identify weight problems. It estimates body fat based on height and weight. Your health care provider can help determine your BMI and help you achieve or maintain a healthy weight. Get regular exercise Get regular exercise. This is one of the most important things you can do for your health. Most adults should:  Exercise for at least 150 minutes each week. The exercise should increase your heart rate and make you sweat (moderate-intensity exercise).  Do strengthening exercises at least twice a week. This is in addition to the moderate-intensity exercise.  Spend less time sitting. Even light physical activity can be beneficial. Watch cholesterol and blood lipids Have your blood tested for lipids and cholesterol at 52 years of age, then have this test every 5 years. Have your cholesterol levels checked more often if:  Your lipid or cholesterol levels are high.  You are older than 52 years of age.  You are at high risk for heart disease. What should I know about cancer screening? Depending on your health history and family history, you may need to have cancer screening at various ages. This may include screening for:  Breast cancer.  Cervical cancer.  Colorectal cancer.  Skin cancer.  Lung cancer. What should I know about heart disease, diabetes, and high blood  pressure? Blood pressure and heart disease  High blood pressure causes heart disease and increases the risk of stroke. This is more likely to develop in people who have high blood pressure readings, are of African descent, or are overweight.  Have your blood pressure checked: ? Every 3-5 years if you are 18-39 years of age. ? Every year if you are 40 years old or older. Diabetes Have regular diabetes screenings. This checks your fasting blood sugar level. Have the screening done:  Once every three years after age 40 if you are at a normal weight and have a low risk for diabetes.  More often and at a younger age if you are overweight or have a high risk for diabetes. What should I know about preventing infection? Hepatitis B If you have a higher risk for hepatitis B, you should be screened for this virus. Talk with your health care provider to find out if you are at risk for hepatitis B infection. Hepatitis C Testing is recommended for:  Everyone born from 1945 through 1965.  Anyone with known risk factors for hepatitis C. Sexually transmitted infections (STIs)  Get screened for STIs, including gonorrhea and chlamydia, if: ? You are sexually active and are younger than 52 years of age. ? You are older than 52 years of age and your health care provider tells you that you are at risk for this type of infection. ? Your sexual activity has changed since you were last screened, and you are at increased risk for chlamydia or gonorrhea. Ask your health care provider   if you are at risk.  Ask your health care provider about whether you are at high risk for HIV. Your health care provider may recommend a prescription medicine to help prevent HIV infection. If you choose to take medicine to prevent HIV, you should first get tested for HIV. You should then be tested every 3 months for as long as you are taking the medicine. Pregnancy  If you are about to stop having your period (premenopausal) and  you may become pregnant, seek counseling before you get pregnant.  Take 400 to 800 micrograms (mcg) of folic acid every day if you become pregnant.  Ask for birth control (contraception) if you want to prevent pregnancy. Osteoporosis and menopause Osteoporosis is a disease in which the bones lose minerals and strength with aging. This can result in bone fractures. If you are 65 years old or older, or if you are at risk for osteoporosis and fractures, ask your health care provider if you should:  Be screened for bone loss.  Take a calcium or vitamin D supplement to lower your risk of fractures.  Be given hormone replacement therapy (HRT) to treat symptoms of menopause. Follow these instructions at home: Lifestyle  Do not use any products that contain nicotine or tobacco, such as cigarettes, e-cigarettes, and chewing tobacco. If you need help quitting, ask your health care provider.  Do not use street drugs.  Do not share needles.  Ask your health care provider for help if you need support or information about quitting drugs. Alcohol use  Do not drink alcohol if: ? Your health care provider tells you not to drink. ? You are pregnant, may be pregnant, or are planning to become pregnant.  If you drink alcohol: ? Limit how much you use to 0-1 drink a day. ? Limit intake if you are breastfeeding.  Be aware of how much alcohol is in your drink. In the [place] one drink equals one 12 oz bottle of beer (355 mL), one 5 oz glass of wine (148 mL), or one 1 oz glass of hard liquor (44 mL). General instructions  Schedule regular health, dental, and eye exams.  Stay current with your vaccines.  Tell your health care provider if: ? You often feel depressed. ? You have ever been abused or do not feel safe at home. Summary  Adopting a healthy lifestyle and getting preventive care are important in promoting health and wellness.  Follow your health care provider's instructions about healthy  diet, exercising, and getting tested or screened for diseases.  Follow your health care provider's instructions on monitoring your cholesterol and blood pressure. This information is not intended to replace advice given to you by your health care provider. Make sure you discuss any questions you have with your health care provider. Document Revised: 08/31/2018 Document Reviewed: 08/31/2018 Elsevier Patient Education  2021 Elsevier Inc.  

## 2021-01-02 ENCOUNTER — Ambulatory Visit: Payer: Federal, State, Local not specified - PPO

## 2021-01-02 ENCOUNTER — Encounter: Payer: Self-pay | Admitting: Physician Assistant

## 2021-01-02 NOTE — Progress Notes (Signed)
   To:                 P    [facility] CLINICAL POOL            Annamaria Helling, CMA                            Insert SmartText                                                        Also file Progress Note             Copy from Pt Comm                 MyChart Comment   Release results to [facility] SmartText                                                       Notify if not viewed              Copy from Result Note              Accept         Apply to Selected         Cancel            [place],   Thyroid looks great.  Vitamin C12 low normal. Certainly down from a year ago. Did you decrease what you were taking.  Platelets up some. I would start 81mg  of ASA daily.

## 2021-01-05 LAB — CBC
HCT: 40 % (ref 35.0–45.0)
Hemoglobin: 12.8 g/dL (ref 11.7–15.5)
MCH: 28.4 pg (ref 27.0–33.0)
MCHC: 32 g/dL (ref 32.0–36.0)
MCV: 88.9 fL (ref 80.0–100.0)
MPV: 10.7 fL (ref 7.5–12.5)
Platelets: 411 10*3/uL — ABNORMAL HIGH (ref 140–400)
RBC: 4.5 10*6/uL (ref 3.80–5.10)
RDW: 12.5 % (ref 11.0–15.0)
WBC: 5.4 10*3/uL (ref 3.8–10.8)

## 2021-01-05 LAB — COMPLETE METABOLIC PANEL WITH GFR
AG Ratio: 1.6 (calc) (ref 1.0–2.5)
ALT: 11 U/L (ref 6–29)
AST: 14 U/L (ref 10–35)
Albumin: 4.2 g/dL (ref 3.6–5.1)
Alkaline phosphatase (APISO): 49 U/L (ref 37–153)
BUN/Creatinine Ratio: 10 (calc) (ref 6–22)
BUN: 6 mg/dL — ABNORMAL LOW (ref 7–25)
CO2: 29 mmol/L (ref 20–32)
Calcium: 9.4 mg/dL (ref 8.6–10.4)
Chloride: 105 mmol/L (ref 98–110)
Creat: 0.59 mg/dL (ref 0.50–1.05)
GFR, Est African American: 122 mL/min/{1.73_m2} (ref >=60)
GFR, Est Non African American: 105 mL/min/{1.73_m2} (ref >=60)
Globulin: 2.7 g/dL (calc) (ref 1.9–3.7)
Glucose, Bld: 78 mg/dL (ref 65–99)
Potassium: 4.2 mmol/L (ref 3.5–5.3)
Sodium: 140 mmol/L (ref 135–146)
Total Bilirubin: 0.3 mg/dL (ref 0.2–1.2)
Total Protein: 6.9 g/dL (ref 6.1–8.1)

## 2021-01-05 LAB — IRON,TIBC AND FERRITIN PANEL
%SAT: 20 % (calc) (ref 16–45)
Ferritin: 173 ng/mL (ref 16–232)
Iron: 48 ug/dL (ref 45–160)
TIBC: 242 mcg/dL (calc) — ABNORMAL LOW (ref 250–450)

## 2021-01-05 LAB — B12 AND FOLATE PANEL
Folate: 10.2 ng/mL
Vitamin B-12: 311 pg/mL (ref 200–1100)

## 2021-01-05 LAB — VITAMIN B6: Vitamin B6: 18.7 ng/mL (ref 2.1–21.7)

## 2021-01-05 LAB — TSH+FREE T4: TSH W/REFLEX TO FT4: 0.84 mIU/L

## 2021-01-06 DIAGNOSIS — D509 Iron deficiency anemia, unspecified: Secondary | ICD-10-CM | POA: Diagnosis not present

## 2021-01-06 NOTE — Progress Notes (Signed)
B6 is great.

## 2021-01-07 ENCOUNTER — Encounter: Payer: Self-pay | Admitting: Physician Assistant

## 2021-03-05 DIAGNOSIS — D1721 Benign lipomatous neoplasm of skin and subcutaneous tissue of right arm: Secondary | ICD-10-CM | POA: Diagnosis not present

## 2021-03-11 DIAGNOSIS — D509 Iron deficiency anemia, unspecified: Secondary | ICD-10-CM | POA: Diagnosis not present

## 2021-04-04 DIAGNOSIS — K635 Polyp of colon: Secondary | ICD-10-CM | POA: Diagnosis not present

## 2021-04-04 DIAGNOSIS — D125 Benign neoplasm of sigmoid colon: Secondary | ICD-10-CM | POA: Diagnosis not present

## 2021-04-04 DIAGNOSIS — K2950 Unspecified chronic gastritis without bleeding: Secondary | ICD-10-CM | POA: Diagnosis not present

## 2021-04-04 DIAGNOSIS — K3189 Other diseases of stomach and duodenum: Secondary | ICD-10-CM | POA: Diagnosis not present

## 2021-04-04 DIAGNOSIS — D509 Iron deficiency anemia, unspecified: Secondary | ICD-10-CM | POA: Diagnosis not present

## 2021-04-11 DIAGNOSIS — Z1231 Encounter for screening mammogram for malignant neoplasm of breast: Secondary | ICD-10-CM | POA: Diagnosis not present

## 2021-04-11 LAB — HM MAMMOGRAPHY

## 2021-04-28 DIAGNOSIS — D509 Iron deficiency anemia, unspecified: Secondary | ICD-10-CM | POA: Diagnosis not present

## 2021-05-02 ENCOUNTER — Encounter: Payer: Self-pay | Admitting: Physician Assistant

## 2021-06-27 ENCOUNTER — Telehealth: Payer: Self-pay

## 2021-06-27 NOTE — Telephone Encounter (Signed)
Patient calls to ask if it was safe to take tumeric and aspirin concurrently. Advised that as both could thin the blood and increase risk of bleeding it was not recommended to take them together. Patient verbalizes understanding and will only take aspirin.

## 2021-07-21 ENCOUNTER — Other Ambulatory Visit: Payer: Self-pay

## 2021-07-21 ENCOUNTER — Ambulatory Visit: Payer: Federal, State, Local not specified - PPO | Admitting: Physician Assistant

## 2021-07-21 VITALS — BP 118/72 | HR 70 | Ht 64.0 in | Wt 128.0 lb

## 2021-07-21 DIAGNOSIS — E538 Deficiency of other specified B group vitamins: Secondary | ICD-10-CM

## 2021-07-21 DIAGNOSIS — E559 Vitamin D deficiency, unspecified: Secondary | ICD-10-CM

## 2021-07-21 DIAGNOSIS — K909 Intestinal malabsorption, unspecified: Secondary | ICD-10-CM | POA: Diagnosis not present

## 2021-07-21 DIAGNOSIS — Z23 Encounter for immunization: Secondary | ICD-10-CM

## 2021-07-21 DIAGNOSIS — Z1329 Encounter for screening for other suspected endocrine disorder: Secondary | ICD-10-CM
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
## 2021-07-22 ENCOUNTER — Encounter: Payer: Self-pay | Admitting: Physician Assistant

## 2021-07-22 LAB — CBC WITH DIFFERENTIAL/PLATELET
Absolute Monocytes: 512 cells/uL (ref 200–950)
Basophils Absolute: 99 cells/uL (ref 0–200)
Basophils Relative: 1.8 %
Eosinophils Absolute: 314 cells/uL (ref 15–500)
Eosinophils Relative: 5.7 %
HCT: 41.3 % (ref 35.0–45.0)
Hemoglobin: 13.5 g/dL (ref 11.7–15.5)
Lymphs Abs: 1837 cells/uL (ref 850–3900)
MCH: 28.6 pg (ref 27.0–33.0)
MCHC: 32.7 g/dL (ref 32.0–36.0)
MCV: 87.5 fL (ref 80.0–100.0)
MPV: 11.4 fL (ref 7.5–12.5)
Monocytes Relative: 9.3 %
Neutro Abs: 2739 cells/uL (ref 1500–7800)
Neutrophils Relative %: 49.8 %
Platelets: 359 10*3/uL (ref 140–400)
RBC: 4.72 10*6/uL (ref 3.80–5.10)
RDW: 12.7 % (ref 11.0–15.0)
Total Lymphocyte: 33.4 %
WBC: 5.5 10*3/uL (ref 3.8–10.8)

## 2021-07-22 LAB — IRON,TIBC AND FERRITIN PANEL
%SAT: 15 % (calc) — ABNORMAL LOW (ref 16–45)
Ferritin: 150 ng/mL (ref 16–232)
Iron: 34 ug/dL — ABNORMAL LOW (ref 45–160)
TIBC: 224 mcg/dL (calc) — ABNORMAL LOW (ref 250–450)

## 2021-07-22 LAB — TSH: TSH: 0.88 mIU/L

## 2021-07-22 LAB — B12 AND FOLATE PANEL
Folate: 9.4 ng/mL
Vitamin B-12: 297 pg/mL (ref 200–1100)

## 2021-07-22 LAB — VITAMIN D 25 HYDROXY (VIT D DEFICIENCY, FRACTURES): Vit D, 25-Hydroxy: 96 ng/mL (ref 30–100)

## 2021-07-22 NOTE — Progress Notes (Signed)
Anye,   Thyroid normal range.  Vitamin B12 is still low. Would you like to start b12 injections monthly and see if we get b12 up does it make you feel better?  Vitamin D looks GREAT.  Hemoglobin looks good. Iron panel is pending.

## 2021-07-22 NOTE — Progress Notes (Signed)
Serum iron is 34 and iron stores 150.

## 2021-07-28 ENCOUNTER — Encounter: Payer: Self-pay | Admitting: Physician Assistant

## 2021-08-01 ENCOUNTER — Ambulatory Visit (INDEPENDENT_AMBULATORY_CARE_PROVIDER_SITE_OTHER): Payer: Federal, State, Local not specified - PPO | Admitting: Family Medicine

## 2021-08-01 ENCOUNTER — Other Ambulatory Visit: Payer: Self-pay

## 2021-08-01 VITALS — BP 101/60 | HR 77

## 2021-08-01 DIAGNOSIS — E538 Deficiency of other specified B group vitamins: Secondary | ICD-10-CM | POA: Diagnosis not present

## 2021-08-01 MED ORDER — CYANOCOBALAMIN 1000 MCG/ML IJ SOLN
1000.0000 ug | Freq: Once | INTRAMUSCULAR | Status: AC
  Administered 2021-08-01: 1000 ug via INTRAMUSCULAR

## 2021-08-01 NOTE — Progress Notes (Signed)
Established Patient Office Visit  Subjective:  Patient ID: April Vega, female    DOB: [birth year]  Age: 50 y.o. MRN: [id number]  CC:  Chief Complaint  Patient presents with   B12 Injection    HPI April Vega presents for first B12 injection.   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   Menometrorrhagia 01/02/2016    Past Surgical History:  Procedure Laterality Date   bone graph  01/1990   CESAREAN SECTION     ENDOMETRIAL ABLATION W/ NOVASURE  2019   INTRAVASCULAR ULTRASOUND/IVUS Left 05/12/2017   Procedure: Intravascular Ultrasound/IVUS;  Surgeon: Waynetta Sandy, MD;  Location: [facility];  Service: Cardiovascular;  Laterality: Left;  IVC TO LT POPLITEAL VEIN   INTRAVASCULAR ULTRASOUND/IVUS N/A 03/07/2018   Procedure: INTRAVASCULAR ULTRASOUND/IVUS;  Surgeon: Waynetta Sandy, MD;  Location: [facility];  Service: Cardiovascular;  Laterality: N/A;   LOWER EXTREMITY VENOGRAPHY Bilateral 05/11/2017   Procedure: Bilateral Lower Extremity Venography;  Surgeon: Serafina Mitchell, MD;  Location: [facility];  Service: Cardiovascular;  Laterality: Bilateral;   LOWER EXTREMITY VENOGRAPHY N/A 03/07/2018   Procedure: LOWER EXTREMITY VENOGRAPHY;  Surgeon: Waynetta Sandy, MD;  Location: [facility];  Service: Cardiovascular;  Laterality: N/A;   PERIPHERAL VASCULAR BALLOON ANGIOPLASTY Left 03/07/2018   Procedure: PERIPHERAL VASCULAR BALLOON ANGIOPLASTY;  Surgeon: Waynetta Sandy, MD;  Location: [facility];  Service: Cardiovascular;  Laterality: Left;  left common iliac vein   PERIPHERAL VASCULAR INTERVENTION Left 05/12/2017   Procedure: PERIPHERAL VASCULAR INTERVENTION;  Surgeon: Waynetta Sandy, MD;  Location: [facility];  Service: Cardiovascular;  Laterality: Left;  IVC TO LT COMMON FEM VEIN  STENT   PERIPHERAL VASCULAR INTERVENTION Right 03/07/2018   Procedure: PERIPHERAL VASCULAR INTERVENTION;  Surgeon: Waynetta Sandy, MD;  Location: [facility];  Service: Cardiovascular;  Laterality: Right;  right common iliac vein   PERIPHERAL VASCULAR THROMBECTOMY Left 05/11/2017   Procedure: PERIPHERAL VASCULAR THROMBECTOMY;  Surgeon: Serafina Mitchell, MD;  Location: [facility];  Service: Cardiovascular;  Laterality: Left;  left lower extremity venous    Family History  Adopted: Yes    Social History   Socioeconomic History   Marital status: Married    Spouse name: Not on file   Number of children: Not on file   Years of education: Not on file   Highest education level: Not on file  Occupational History   Occupation: teacher  Tobacco Use   Smoking status: Never   Smokeless tobacco: Never  Vaping Use   Vaping Use: Never used  Substance and Sexual Activity   Alcohol use: No    Alcohol/week: 0.0 standard drinks   Drug use: No   Sexual activity: Yes    Partners: Male    Birth control/protection: None    Comment: Occ use condoms  Other Topics Concern   Not on file  Social History Narrative   Not on file   Social Determinants of Health   Financial Resource Strain: Not on file  Food Insecurity: Not on file  Transportation Needs: Not on file  Physical Activity: Not on file  Stress: Not on file  Social Connections: Not on file  Intimate Partner Violence: Not on  file    Outpatient Medications Prior to Visit  Medication Sig Dispense Refill   aspirin 81 MG tablet Take by mouth.     Iron-FA-B Cmp-C-Biot-Probiotic (FUSION PLUS) CAPS      RESTASIS 0.05 % ophthalmic emulsion      Vitamin D, Ergocalciferol, (DRISDOL) 1.25 MG (50000 UNIT) CAPS capsule TAKE 1 CAPSULE BY MOUTH EVERY 7 DAYS 12 capsule 3   No facility-administered medications prior to visit.     Allergies  Allergen Reactions   Wellbutrin [Bupropion] Other (See Comments)    Temporary memory loss   Latex Rash and Swelling    ROS Review of Systems    Objective:    Physical Exam  BP 101/60   Pulse 77   SpO2 100%  Wt Readings from Last 3 Encounters:  07/21/21 128 lb (58.1 kg)  01/01/21 127 lb (57.6 kg)  11/22/20 122 lb (55.3 kg)     There are no preventive care reminders to display for this patient.  There are no preventive care reminders to display for this patient.  Lab Results  Component Value Date   TSH 0.88 07/21/2021   Lab Results  Component Value Date   WBC 5.5 07/21/2021   HGB 13.5 07/21/2021   HCT 41.3 07/21/2021   MCV 87.5 07/21/2021   PLT 359 07/21/2021   Lab Results  Component Value Date   NA 140 01/01/2021   K 4.2 01/01/2021   CHLORIDE 109 01/02/2016   CO2 29 01/01/2021   GLUCOSE 78 01/01/2021   BUN 6 (L) 01/01/2021   CREATININE 0.59 01/01/2021   BILITOT 0.3 01/01/2021   ALKPHOS 51 06/24/2018   AST 14 01/01/2021   ALT 11 01/01/2021   PROT 6.9 01/01/2021   ALBUMIN 4.1 06/24/2018   CALCIUM 9.4 01/01/2021   ANIONGAP 9 06/24/2018   EGFR >90 01/02/2016   Lab Results  Component Value Date   CHOL 183 11/25/2020   Lab Results  Component Value Date   HDL 68 11/25/2020   Lab Results  Component Value Date   LDLCALC 101 (H) 11/25/2020   Lab Results  Component Value Date   TRIG 49 11/25/2020   Lab Results  Component Value Date   CHOLHDL 2.7 11/25/2020   Lab Results  Component Value Date   HGBA1C 5.2 01/30/2019      Assessment & Plan:  B12 deficiency - Patient tolerated injection well without complications. Patient advised to schedule next injection 30 days from today.    Problem List Items Addressed This Visit     B12 deficiency - Primary    Meds ordered this encounter  Medications   cyanocobalamin ((VITAMIN B-12)) injection 1,000 mcg    Follow-up: Return in about 1 month (around 08/31/2021) for B12  injection. Durene Romans, Monico Blitz, [place]

## 2021-08-01 NOTE — Progress Notes (Signed)
Agree with documentation as above.   Delayza Lungren, MD  

## 2021-08-13 DIAGNOSIS — D509 Iron deficiency anemia, unspecified: Secondary | ICD-10-CM | POA: Diagnosis not present

## 2021-08-25 DIAGNOSIS — D509 Iron deficiency anemia, unspecified: Secondary | ICD-10-CM | POA: Diagnosis not present

## 2021-09-01 ENCOUNTER — Ambulatory Visit: Payer: Federal, State, Local not specified - PPO

## 2021-09-01 DIAGNOSIS — D509 Iron deficiency anemia, unspecified: Secondary | ICD-10-CM | POA: Diagnosis not present

## 2021-09-04 ENCOUNTER — Ambulatory Visit (INDEPENDENT_AMBULATORY_CARE_PROVIDER_SITE_OTHER): Payer: Federal, State, Local not specified - PPO | Admitting: Physician Assistant

## 2021-09-04 ENCOUNTER — Other Ambulatory Visit: Payer: Self-pay

## 2021-09-04 VITALS — BP 127/77 | HR 77 | Ht 64.0 in | Wt 129.0 lb

## 2021-09-04 DIAGNOSIS — E538 Deficiency of other specified B group vitamins: Secondary | ICD-10-CM

## 2021-09-04 MED ORDER — CYANOCOBALAMIN 1000 MCG/ML IJ SOLN
1000.0000 ug | Freq: Once | INTRAMUSCULAR | Status: AC
  Administered 2021-09-04: 1000 ug via INTRAMUSCULAR

## 2021-09-04 NOTE — Progress Notes (Signed)
Pt is here for a vitamin B12 injection. Denies GI problems or dizziness.  Pt tolerated injection well without complications. Pt advised to schedule next injection in 30 days.

## 2021-09-08 NOTE — Progress Notes (Signed)
Agree with above plan. 

## 2021-09-20 ENCOUNTER — Other Ambulatory Visit: Payer: Self-pay | Admitting: Physician Assistant

## 2021-10-06 ENCOUNTER — Other Ambulatory Visit: Payer: Self-pay

## 2021-10-06 ENCOUNTER — Ambulatory Visit (INDEPENDENT_AMBULATORY_CARE_PROVIDER_SITE_OTHER): Payer: Federal, State, Local not specified - PPO | Admitting: Physician Assistant

## 2021-10-06 VITALS — BP 111/69 | HR 73

## 2021-10-06 DIAGNOSIS — E538 Deficiency of other specified B group vitamins: Secondary | ICD-10-CM

## 2021-10-06 MED ORDER — CYANOCOBALAMIN 1000 MCG/ML IJ SOLN
1000.0000 ug | Freq: Once | INTRAMUSCULAR | Status: AC
  Administered 2021-10-06: 1000 ug via INTRAMUSCULAR

## 2021-10-06 NOTE — Progress Notes (Signed)
Established Patient Office Visit  Subjective:  Patient ID: April Vega, female    DOB: 1971-09-17  Age: 51 y.o. MRN: [id number]  CC:  Chief Complaint  Patient presents with   Pernicious Anemia    HPI April Vega is here for a vitamin B 12 injection. Denies muscle cramps, weakness or irregular heart rate.    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   Menometrorrhagia 01/02/2016    Past Surgical History:  Procedure Laterality Date   bone graph  01/1990   CESAREAN SECTION     ENDOMETRIAL ABLATION W/ NOVASURE  2019   INTRAVASCULAR ULTRASOUND/IVUS Left 05/12/2017   Procedure: Intravascular Ultrasound/IVUS;  Surgeon: Waynetta Sandy, MD;  Location: [facility];  Service: Cardiovascular;  Laterality: Left;  IVC TO LT POPLITEAL VEIN   INTRAVASCULAR ULTRASOUND/IVUS N/A 03/07/2018   Procedure: INTRAVASCULAR ULTRASOUND/IVUS;  Surgeon: Waynetta Sandy, MD;  Location: [facility];  Service: Cardiovascular;  Laterality: N/A;   LOWER EXTREMITY VENOGRAPHY Bilateral 05/11/2017   Procedure: Bilateral Lower Extremity Venography;  Surgeon: Serafina Mitchell, MD;  Location: [facility];  Service: Cardiovascular;  Laterality: Bilateral;   LOWER EXTREMITY VENOGRAPHY N/A 03/07/2018   Procedure: LOWER EXTREMITY VENOGRAPHY;  Surgeon: Waynetta Sandy, MD;  Location: [facility];  Service: Cardiovascular;  Laterality: N/A;   PERIPHERAL VASCULAR BALLOON ANGIOPLASTY Left 03/07/2018   Procedure: PERIPHERAL VASCULAR BALLOON ANGIOPLASTY;  Surgeon: Waynetta Sandy, MD;  Location: [facility];  Service: Cardiovascular;  Laterality: Left;  left common iliac vein   PERIPHERAL VASCULAR INTERVENTION Left 05/12/2017   Procedure: PERIPHERAL VASCULAR  INTERVENTION;  Surgeon: Waynetta Sandy, MD;  Location: [facility];  Service: Cardiovascular;  Laterality: Left;  IVC TO LT COMMON FEM VEIN  STENT   PERIPHERAL VASCULAR INTERVENTION Right 03/07/2018   Procedure: PERIPHERAL VASCULAR INTERVENTION;  Surgeon: Waynetta Sandy, MD;  Location: [facility];  Service: Cardiovascular;  Laterality: Right;  right common iliac vein   PERIPHERAL VASCULAR THROMBECTOMY Left 05/11/2017   Procedure: PERIPHERAL VASCULAR THROMBECTOMY;  Surgeon: Serafina Mitchell, MD;  Location: [facility];  Service: Cardiovascular;  Laterality: Left;  left lower extremity venous    Family History  Adopted: Yes    Social History   Socioeconomic History   Marital status: Married    Spouse name: Not on file   Number of children: Not on file   Years of education: Not on file   Highest education level: Not on file  Occupational History   Occupation: teacher  Tobacco Use   Smoking status: Never   Smokeless tobacco: Never  Vaping Use   Vaping Use: Never used  Substance and Sexual Activity   Alcohol use: No    Alcohol/week: 0.0 standard drinks   Drug use: No   Sexual activity: Yes    Partners: Male    Birth control/protection: None    Comment: Occ use condoms  Other Topics Concern   Not on file  Social History Narrative   Not on file   Social Determinants of Health   Financial Resource Strain: Not on file  Food Insecurity: Not on file  Transportation Needs: Not on file  Physical Activity: Not on file  Stress: Not on  Social Connections: Not on file  °Intimate Partner Violence: Not on file  ° ° °Outpatient Medications Prior to Visit  °Medication Sig Dispense Refill  ° aspirin 81 MG tablet Take by mouth.    ° Iron-FA-B Cmp-C-Biot-Probiotic (FUSION PLUS) CAPS     ° RESTASIS 0.05 % ophthalmic emulsion     ° Vitamin D, Ergocalciferol, (DRISDOL) 1.25 MG (50000 UNIT) CAPS capsule TAKE 1 CAPSULE BY MOUTH ONE TIME PER WEEK 12  capsule 3  ° °No facility-administered medications prior to visit.  ° ° °Allergies  °Allergen Reactions  ° Wellbutrin [Bupropion] Other (See Comments)  °  Temporary memory loss  ° Latex Rash and Swelling  ° ° °ROS °Review of Systems ° °  °Objective:  °  °Physical Exam ° °BP 111/69    Pulse 73    SpO2 100%  °Wt Readings from Last 3 Encounters:  °09/04/21 129 lb (58.5 kg)  °07/21/21 128 lb (58.1 kg)  °01/01/21 127 lb (57.6 kg)  ° ° ° °Health Maintenance Due  °Topic Date Due  ° COVID-19 Vaccine (5 - Booster for Pfizer series) 05/25/2021  ° ° °There are no preventive care reminders to display for this patient. ° °Lab Results  °Component Value Date  ° TSH 0.88 07/21/2021  ° °Lab Results  °Component Value Date  ° WBC 5.5 07/21/2021  ° HGB 13.5 07/21/2021  ° HCT 41.3 07/21/2021  ° MCV 87.5 07/21/2021  ° PLT 359 07/21/2021  ° °Lab Results  °Component Value Date  ° NA 140 01/01/2021  ° K 4.2 01/01/2021  ° CHLORIDE 109 01/02/2016  ° CO2 29 01/01/2021  ° GLUCOSE 78 01/01/2021  ° BUN 6 (L) 01/01/2021  ° CREATININE 0.59 01/01/2021  ° BILITOT 0.3 01/01/2021  ° ALKPHOS 51 06/24/2018  ° AST 14 01/01/2021  ° ALT 11 01/01/2021  ° PROT 6.9 01/01/2021  ° ALBUMIN 4.1 06/24/2018  ° CALCIUM 9.4 01/01/2021  ° ANIONGAP 9 06/24/2018  ° EGFR >90 01/02/2016  ° °Lab Results  °Component Value Date  ° CHOL 183 11/25/2020  ° °Lab Results  °Component Value Date  ° HDL 68 11/25/2020  ° °Lab Results  °Component Value Date  ° LDLCALC 101 (H) 11/25/2020  ° °Lab Results  °Component Value Date  ° TRIG 49 11/25/2020  ° °Lab Results  °Component Value Date  ° CHOLHDL 2.7 11/25/2020  ° °Lab Results  °Component Value Date  ° HGBA1C 5.2 01/30/2019  ° ° °  °Assessment & Plan:  °B12 deficiency - Patient tolerated injection well without complications. Patient advised to schedule next injection 30 days from today.   ° °Problem List Items Addressed This Visit   ° ° B12 deficiency - Primary  ° ° °Meds ordered this encounter  °Medications  ° cyanocobalamin ((VITAMIN  B-12)) injection 1,000 mcg  ° ° °Follow-up: Return in about 1 month (around 11/06/2021) for B12 injection. .  ° ° °,  [place], CMA °

## 2021-10-06 NOTE — Progress Notes (Signed)
° °  Subjective:    Patient ID: April Vega, female    DOB: 04/13/1971, 51 y.o.   MRN: [id number]  HPI  Agree with the above plan.  Review of Systems     Objective:   Physical Exam        Assessment & Plan:

## 2021-11-10 ENCOUNTER — Ambulatory Visit (INDEPENDENT_AMBULATORY_CARE_PROVIDER_SITE_OTHER): Payer: Federal, State, Local not specified - PPO | Admitting: Family Medicine

## 2021-11-10 ENCOUNTER — Other Ambulatory Visit: Payer: Self-pay

## 2021-11-10 VITALS — BP 98/63 | HR 86

## 2021-11-10 DIAGNOSIS — E538 Deficiency of other specified B group vitamins: Secondary | ICD-10-CM | POA: Diagnosis not present

## 2021-11-10 MED ORDER — CYANOCOBALAMIN 1000 MCG/ML IJ SOLN
1000.0000 ug | Freq: Once | INTRAMUSCULAR | Status: AC
  Administered 2021-11-10: 1000 ug via INTRAMUSCULAR

## 2021-11-10 NOTE — Progress Notes (Signed)
Agree with documentation as above.   Esaias Cleavenger, MD  

## 2021-11-10 NOTE — Progress Notes (Signed)
Patient is here for a vitamin B12 injection. Denies gastrointestinal problems or dizziness. B12 injection to left deltoid with no apparent complications. Patient advised to schedule next injection in 30 days.   

## 2021-11-11 ENCOUNTER — Encounter: Payer: Self-pay | Admitting: Physician Assistant

## 2021-12-08 ENCOUNTER — Ambulatory Visit (INDEPENDENT_AMBULATORY_CARE_PROVIDER_SITE_OTHER): Payer: Federal, State, Local not specified - PPO | Admitting: Physician Assistant

## 2021-12-08 ENCOUNTER — Other Ambulatory Visit: Payer: Self-pay

## 2021-12-08 VITALS — BP 122/80 | HR 71 | Resp 20

## 2021-12-08 DIAGNOSIS — E538 Deficiency of other specified B group vitamins: Secondary | ICD-10-CM

## 2021-12-08 MED ORDER — CYANOCOBALAMIN 1000 MCG/ML IJ SOLN
1000.0000 ug | Freq: Once | INTRAMUSCULAR | Status: AC
  Administered 2021-12-08: 1000 ug via INTRAMUSCULAR

## 2021-12-08 NOTE — Progress Notes (Signed)
Vitamin B12 injection. Denies muscle cramps, weakness or irregular heart rate. ? ?Administered injection in Right Deltoid ? ?Follow - up in 1 month. ?

## 2021-12-08 NOTE — Progress Notes (Signed)
Agree with above plan. 

## 2021-12-21 ENCOUNTER — Other Ambulatory Visit: Payer: Self-pay

## 2021-12-21 DIAGNOSIS — I82413 Acute embolism and thrombosis of femoral vein, bilateral: Secondary | ICD-10-CM

## 2021-12-22 ENCOUNTER — Other Ambulatory Visit (HOSPITAL_COMMUNITY)
Admission: RE | Admit: 2021-12-22 | Discharge: 2021-12-22 | Disposition: A | Discharge status: Home / Self Care | Payer: Federal, State, Local not specified - PPO | Source: Ambulatory Visit | Attending: Obstetrics & Gynecology | Admitting: Obstetrics & Gynecology

## 2021-12-22 ENCOUNTER — Ambulatory Visit (INDEPENDENT_AMBULATORY_CARE_PROVIDER_SITE_OTHER): Payer: Federal, State, Local not specified - PPO | Admitting: Family Medicine

## 2021-12-22 ENCOUNTER — Encounter: Payer: Self-pay | Admitting: Obstetrics & Gynecology

## 2021-12-22 ENCOUNTER — Encounter: Payer: Self-pay | Admitting: Family Medicine

## 2021-12-22 ENCOUNTER — Ambulatory Visit (INDEPENDENT_AMBULATORY_CARE_PROVIDER_SITE_OTHER): Payer: Federal, State, Local not specified - PPO | Admitting: Obstetrics & Gynecology

## 2021-12-22 VITALS — BP 105/70 | HR 76 | Resp 16 | Ht 64.0 in | Wt 122.0 lb

## 2021-12-22 DIAGNOSIS — M2170 Unequal limb length (acquired), unspecified site: Secondary | ICD-10-CM | POA: Diagnosis not present

## 2021-12-22 DIAGNOSIS — Z01419 Encounter for gynecological examination (general) (routine) without abnormal findings: Secondary | ICD-10-CM | POA: Diagnosis not present

## 2021-12-22 NOTE — Progress Notes (Signed)
?April Vega - 51 y.o. female MRN [id number]  Date of birth: 09/06/1971 ? ?SUBJECTIVE:  Including CC & ROS.  ?No chief complaint on file. ? ? ?April Vega is a 51 y.o. female that is  here for orthotics. ? ? ? ?Review of Systems ?See HPI  ? ?HISTORY: Past Medical, Surgical, Social, and Family History Reviewed & Updated per EMR.   ?Pertinent Historical Findings include: ? ?Past Medical History:  ?Diagnosis Date  ? ADHD (attention deficit hyperactivity disorder)   ? Anemia   ? 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 DVT (deep venous thrombosis) ([place])   ? 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 Menometrorrhagia 01/02/2016  ? ? ?Past Surgical History:  ?Procedure Laterality Date  ? bone graph  01/1990  ? CESAREAN SECTION    ? ENDOMETRIAL ABLATION W/ NOVASURE  2019  ? INTRAVASCULAR ULTRASOUND/IVUS Left 05/12/2017  ? Procedure: Intravascular Ultrasound/IVUS;  Surgeon: Waynetta Sandy, MD;  Location: [facility];  Service: Cardiovascular;  Laterality: Left;  IVC TO LT POPLITEAL VEIN  ? INTRAVASCULAR ULTRASOUND/IVUS N/A 03/07/2018  ? Procedure: INTRAVASCULAR ULTRASOUND/IVUS;  Surgeon: Waynetta Sandy, MD;  Location: [facility];  Service: Cardiovascular;  Laterality: N/A;  ? LOWER EXTREMITY VENOGRAPHY Bilateral 05/11/2017  ? Procedure: Bilateral Lower Extremity Venography;  Surgeon: Serafina Mitchell, MD;  Location: [facility];  Service: Cardiovascular;  Laterality: Bilateral;  ? LOWER EXTREMITY VENOGRAPHY N/A 03/07/2018  ? Procedure: LOWER EXTREMITY VENOGRAPHY;  Surgeon: Waynetta Sandy, MD;  Location: [facility];  Service: Cardiovascular;  Laterality: N/A;  ? PERIPHERAL VASCULAR BALLOON ANGIOPLASTY Left 03/07/2018  ? Procedure: PERIPHERAL VASCULAR BALLOON ANGIOPLASTY;  Surgeon: Waynetta Sandy, MD;  Location: [facility];  Service: Cardiovascular;  Laterality: Left;  left common iliac vein  ? PERIPHERAL VASCULAR INTERVENTION Left  05/12/2017  ? Procedure: PERIPHERAL VASCULAR INTERVENTION;  Surgeon: Waynetta Sandy, MD;  Location: [facility];  Service: Cardiovascular;  Laterality: Left;  IVC TO LT COMMON FEM VEIN  STENT  ? PERIPHERAL VASCULAR INTERVENTION Right 03/07/2018  ? Procedure: PERIPHERAL VASCULAR INTERVENTION;  Surgeon: Waynetta Sandy, MD;  Location: [facility];  Service: Cardiovascular;  Laterality: Right;  right common iliac vein  ? PERIPHERAL VASCULAR THROMBECTOMY Left 05/11/2017  ? Procedure: PERIPHERAL VASCULAR THROMBECTOMY;  Surgeon: Serafina Mitchell, MD;  Location: [facility];  Service: Cardiovascular;  Laterality: Left;  left lower extremity venous  ? ? ? ?PHYSICAL EXAM:  ?VS: Ht '5\' 4"'$  (1.626 m)   Wt 129 lb (58.5 kg)   BMI 22.14 kg/m?  ?Physical Exam ?Gen: NAD, alert, cooperative with exam, well-appearing ?MSK:  ?Neurovascularly intact   ? ?Patient was fitted for a standard, cushioned, semi-rigid orthotic. ?The orthotic was heated and afterward the patient stood on the orthotic blank positioned on the orthotic stand. ?The patient was positioned in subtalar neutral position and 10 degrees of ankle dorsiflexion in a weight bearing stance. ?After completion of molding, a stable base was applied to the orthotic blank. ?The blank was ground to a stable position for weight bearing. ?Size: 42m ?Pairs: 2 ?[facility] ?Additional Posting and Padding: Left with 3/4 length heel lift of 7/16" and heel lift 5/16" ?The patient ambulated these, and they were very comfortable. ? ? ? ? ?ASSESSMENT & PLAN:  ? ?Acquired leg length discrepancy ?Acute on chronic in nature.  Is here for new orthotics. ?-Orthotics. ?-Provided heel lifts. ? ? ? ? ?

## 2021-12-22 NOTE — Assessment & Plan Note (Signed)
Acute on chronic in nature.  Is here for new orthotics. ?-Orthotics. ?-Provided heel lifts. ? ?

## 2021-12-22 NOTE — Progress Notes (Signed)
? ? ?GYNECOLOGY ANNUAL PREVENTATIVE CARE ENCOUNTER NOTE ? ?History:    ? April Vega is a 51 y.o. G51P2012 female here for a routine annual gynecologic exam.  Current complaints: none.   Denies abnormal vaginal bleeding, discharge, pelvic pain, problems with intercourse or other gynecologic concerns.  ?  ?Gynecologic History ?No LMP recorded. Patient has had an ablation. ?Contraception: abstinence ?Last Pap: 10/10/2020. Result was normal with negative HPV ?Last Mammogram: 04/11/2021.  Result was normal ?Last Cologuard 02/08/2019.  Result was normal ? ?Obstetric History ?OB History  ?Gravida Para Term Preterm AB Living  ?'3 2 2   1 2  '$ ?SAB IAB Ectopic Multiple Live Births  ?1          ?  ?# Outcome Date GA Lbr Len/2nd Weight Sex Delivery Anes PTL Lv  ?3 SAB           ?2 Term           ?1 Term      CS-Unspec     ? ? ?Past Medical History:  ?Diagnosis Date  ? ADHD (attention deficit hyperactivity disorder)   ? Anemia   ? 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 DVT (deep venous thrombosis) ([place])   ? 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 Menometrorrhagia 01/02/2016  ? ? ?Past Surgical History:  ?Procedure Laterality Date  ? bone graph  01/1990  ? CESAREAN SECTION    ? ENDOMETRIAL ABLATION W/ NOVASURE  2019  ? INTRAVASCULAR ULTRASOUND/IVUS Left 05/12/2017  ? Procedure: Intravascular Ultrasound/IVUS;  Surgeon: Waynetta Sandy, MD;  Location: [facility];  Service: Cardiovascular;  Laterality: Left;  IVC TO LT POPLITEAL VEIN  ? INTRAVASCULAR ULTRASOUND/IVUS N/A 03/07/2018  ? Procedure: INTRAVASCULAR ULTRASOUND/IVUS;  Surgeon: Waynetta Sandy, MD;  Location: [facility];  Service: Cardiovascular;  Laterality: N/A;  ? LOWER EXTREMITY VENOGRAPHY Bilateral 05/11/2017  ? Procedure: Bilateral Lower Extremity Venography;  Surgeon: Serafina Mitchell, MD;  Location: [facility];  Service: Cardiovascular;  Laterality: Bilateral;  ? LOWER EXTREMITY VENOGRAPHY N/A 03/07/2018  ? Procedure:  LOWER EXTREMITY VENOGRAPHY;  Surgeon: Waynetta Sandy, MD;  Location: [facility];  Service: Cardiovascular;  Laterality: N/A;  ? PERIPHERAL VASCULAR BALLOON ANGIOPLASTY Left 03/07/2018  ? Procedure: PERIPHERAL VASCULAR BALLOON ANGIOPLASTY;  Surgeon: Waynetta Sandy, MD;  Location: [facility];  Service: Cardiovascular;  Laterality: Left;  left common iliac vein  ? PERIPHERAL VASCULAR INTERVENTION Left 05/12/2017  ? Procedure: PERIPHERAL VASCULAR INTERVENTION;  Surgeon: Waynetta Sandy, MD;  Location: [facility];  Service: Cardiovascular;  Laterality: Left;  IVC TO LT COMMON FEM VEIN  STENT  ? PERIPHERAL VASCULAR INTERVENTION Right 03/07/2018  ? Procedure: PERIPHERAL VASCULAR INTERVENTION;  Surgeon: Waynetta Sandy, MD;  Location: [facility];  Service: Cardiovascular;  Laterality: Right;  right common iliac vein  ? PERIPHERAL VASCULAR THROMBECTOMY Left 05/11/2017  ? Procedure: PERIPHERAL VASCULAR THROMBECTOMY;  Surgeon: Serafina Mitchell, MD;  Location: [facility];  Service: Cardiovascular;  Laterality: Left;  left lower extremity venous  ? ? ?Current Outpatient Medications on File Prior to Visit  ?Medication Sig Dispense Refill  ? aspirin 81 MG tablet Take by mouth.    ? Iron-FA-B Cmp-C-Biot-Probiotic (FUSION PLUS) CAPS     ? RESTASIS 0.05 % ophthalmic emulsion     ? Vitamin D, Ergocalciferol, (DRISDOL) 1.25 MG (50000 UNIT) CAPS capsule TAKE 1 CAPSULE BY MOUTH ONE TIME PER WEEK  12 capsule 3  ? ?No current facility-administered medications on file prior to visit.  ? ? ?Allergies  ?Allergen Reactions  ? Wellbutrin [Bupropion] Other (See Comments)  ?  Temporary memory loss  ? Latex Rash and Swelling  ? ? ?Social History:  reports that she has never smoked. She has never used smokeless tobacco. She reports that she does not drink alcohol and does not use drugs. ? ?Family History  ?Adopted: Yes  ? ? ?The following portions of the patient's history  were reviewed and updated as appropriate: allergies, current medications, past family history, past medical history, past social history, past surgical history and problem list. ? ?Review of Systems ?Pertinent items noted in HPI and remainder of comprehensive ROS otherwise negative. ? ?Physical Exam:  ?BP 105/70   Pulse 76   Resp 16   Ht '5\' 4"'$  (1.626 m)   Wt 122 lb (55.3 kg)   BMI 20.94 kg/m?  ?CONSTITUTIONAL: Well-developed, well-nourished female in no acute distress.  ?HENT:  Normocephalic, atraumatic, External right and left ear normal.  ?EYES: Conjunctivae and EOM are normal. Pupils are equal, round, and reactive to light. No scleral icterus.  ?NECK: Normal range of motion, supple, no masses.  Normal thyroid.  ?SKIN: Skin is warm and dry. No rash noted. Not diaphoretic. No erythema. No pallor. ?MUSCULOSKELETAL: Normal range of motion. No tenderness.  No cyanosis, clubbing, or edema. ?NEUROLOGIC: Alert and oriented to person, place, and time. Normal reflexes, muscle tone coordination.  ?PSYCHIATRIC: Normal mood and affect. Normal behavior. Normal judgment and thought content. ?CARDIOVASCULAR: Normal heart rate noted, regular rhythm ?RESPIRATORY: Clear to auscultation bilaterally. Effort and breath sounds normal, no problems with respiration noted. ?BREASTS: Symmetric in size. No masses, tenderness, skin changes, nipple drainage, or lymphadenopathy bilaterally. Performed in the presence of a chaperone. ?ABDOMEN: Soft, no distention noted.  No tenderness, rebound or guarding.  ?PELVIC: Normal appearing external genitalia and urethral meatus; normal appearing vaginal mucosa and cervix. Mild atrophy.  No abnormal vaginal discharge noted.  Pap smear obtained.  Normal uterine size, no other palpable masses, no uterine or adnexal tenderness.  Performed in the presence of a chaperone. ?  ?Assessment and Plan:  ?  1. Well woman exam with routine gynecological exam ?- Cytology - PAP ?Will follow up results of pap  smear and manage accordingly. ?Mammogram and colon cancer screening is up to date ?Routine preventative health maintenance measures emphasized. ?Please refer to After Visit Summary for other counseling recommendations.  ?   ? ?Verita Schneiders, MD, FACOG ?Obstetrician Social research officer, government, Faculty Practice ?[facility] ? ?

## 2021-12-23 DIAGNOSIS — E538 Deficiency of other specified B group vitamins: Secondary | ICD-10-CM | POA: Diagnosis not present

## 2021-12-23 DIAGNOSIS — D509 Iron deficiency anemia, unspecified: Secondary | ICD-10-CM | POA: Diagnosis not present

## 2021-12-24 ENCOUNTER — Ambulatory Visit: Payer: Federal, State, Local not specified - PPO | Admitting: Family Medicine

## 2021-12-24 LAB — CYTOLOGY - PAP
Comment: NEGATIVE
Diagnosis: NEGATIVE
High risk HPV: NEGATIVE

## 2021-12-30 ENCOUNTER — Ambulatory Visit: Payer: Federal, State, Local not specified - PPO

## 2021-12-30 ENCOUNTER — Encounter (HOSPITAL_COMMUNITY): Payer: Federal, State, Local not specified - PPO

## 2022-01-08 ENCOUNTER — Ambulatory Visit (INDEPENDENT_AMBULATORY_CARE_PROVIDER_SITE_OTHER): Payer: Federal, State, Local not specified - PPO | Admitting: Family Medicine

## 2022-01-08 VITALS — BP 107/63 | HR 70

## 2022-01-08 DIAGNOSIS — E538 Deficiency of other specified B group vitamins: Secondary | ICD-10-CM | POA: Diagnosis not present

## 2022-01-08 MED ORDER — CYANOCOBALAMIN 1000 MCG/ML IJ SOLN
1000.0000 ug | Freq: Once | INTRAMUSCULAR | Status: AC
  Administered 2022-01-08: 1000 ug via INTRAMUSCULAR

## 2022-01-08 NOTE — Progress Notes (Signed)
Patient is here for a vitamin B12 injection. Denies gastrointestinal problems or dizziness. B12 injection to left deltoid with no apparent complications. Patient advised to schedule next injection in 30 days.   

## 2022-01-08 NOTE — Progress Notes (Signed)
Agree with documentation as above.   Esaiah Wanless, MD  

## 2022-01-19 ENCOUNTER — Ambulatory Visit: Payer: Federal, State, Local not specified - PPO | Admitting: Physician Assistant

## 2022-02-06 ENCOUNTER — Ambulatory Visit (INDEPENDENT_AMBULATORY_CARE_PROVIDER_SITE_OTHER): Payer: Federal, State, Local not specified - PPO | Admitting: Family Medicine

## 2022-02-06 VITALS — BP 117/73 | HR 72 | Ht 64.0 in | Wt 125.0 lb

## 2022-02-06 DIAGNOSIS — E538 Deficiency of other specified B group vitamins: Secondary | ICD-10-CM

## 2022-02-06 MED ORDER — CYANOCOBALAMIN 1000 MCG/ML IJ SOLN
1000.0000 ug | Freq: Once | INTRAMUSCULAR | Status: AC
  Administered 2022-02-06: 1000 ug via INTRAMUSCULAR

## 2022-02-06 NOTE — Progress Notes (Signed)
Pt is here for a vitamin B12 injection. Denies GI problems or dizziness. Pt tolerated injections well without complications. Pt advised to schedule next injection in 30 days.

## 2022-02-06 NOTE — Progress Notes (Signed)
Agree with documentation as above.   Sharanda Shinault, MD  

## 2022-02-12 DIAGNOSIS — Z79899 Other long term (current) drug therapy: Secondary | ICD-10-CM | POA: Diagnosis not present

## 2022-02-12 DIAGNOSIS — Z91040 Latex allergy status: Secondary | ICD-10-CM | POA: Diagnosis not present

## 2022-02-12 DIAGNOSIS — M5414 Radiculopathy, thoracic region: Secondary | ICD-10-CM | POA: Diagnosis not present

## 2022-02-12 DIAGNOSIS — J984 Other disorders of lung: Secondary | ICD-10-CM | POA: Diagnosis not present

## 2022-02-12 DIAGNOSIS — Z7982 Long term (current) use of aspirin: Secondary | ICD-10-CM | POA: Diagnosis not present

## 2022-02-12 DIAGNOSIS — M546 Pain in thoracic spine: Secondary | ICD-10-CM | POA: Diagnosis not present

## 2022-02-12 DIAGNOSIS — R079 Chest pain, unspecified: Secondary | ICD-10-CM | POA: Diagnosis not present

## 2022-02-12 DIAGNOSIS — M5416 Radiculopathy, lumbar region: Secondary | ICD-10-CM | POA: Diagnosis not present

## 2022-02-12 DIAGNOSIS — Z888 Allergy status to other drugs, medicaments and biological substances status: Secondary | ICD-10-CM | POA: Diagnosis not present

## 2022-02-12 DIAGNOSIS — R9431 Abnormal electrocardiogram [ECG] [EKG]: Secondary | ICD-10-CM | POA: Diagnosis not present
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
## 2022-03-05 ENCOUNTER — Ambulatory Visit: Payer: Federal, State, Local not specified - PPO | Admitting: Physician Assistant

## 2022-03-05 ENCOUNTER — Ambulatory Visit (HOSPITAL_COMMUNITY)
Admission: RE | Admit: 2022-03-05 | Discharge: 2022-03-05 | Disposition: A | Discharge status: Home / Self Care | Payer: Federal, State, Local not specified - PPO | Source: Ambulatory Visit | Attending: Vascular Surgery | Admitting: Vascular Surgery

## 2022-03-05 ENCOUNTER — Encounter: Payer: Self-pay | Admitting: Physician Assistant

## 2022-03-05 VITALS — BP 113/73 | HR 68 | Temp 98.5°F | Resp 20 | Ht 64.0 in | Wt 121.8 lb

## 2022-03-05 DIAGNOSIS — I82413 Acute embolism and thrombosis of femoral vein, bilateral: Secondary | ICD-10-CM | POA: Insufficient documentation

## 2022-03-05 DIAGNOSIS — I871 Compression of vein: Secondary | ICD-10-CM

## 2022-03-06 ENCOUNTER — Ambulatory Visit: Payer: Federal, State, Local not specified - PPO | Admitting: Physician Assistant

## 2022-03-06 ENCOUNTER — Encounter: Payer: Self-pay | Admitting: Physician Assistant

## 2022-03-06 ENCOUNTER — Encounter (HOSPITAL_COMMUNITY): Payer: Federal, State, Local not specified - PPO

## 2022-03-06 ENCOUNTER — Ambulatory Visit: Payer: Federal, State, Local not specified - PPO

## 2022-03-06 VITALS — BP 101/68 | HR 68 | Ht 64.0 in | Wt 120.0 lb

## 2022-03-06 DIAGNOSIS — D508 Other iron deficiency anemias: Secondary | ICD-10-CM | POA: Diagnosis not present

## 2022-03-06 DIAGNOSIS — E538 Deficiency of other specified B group vitamins: Secondary | ICD-10-CM

## 2022-03-06 DIAGNOSIS — K909 Intestinal malabsorption, unspecified: Secondary | ICD-10-CM

## 2022-03-06 DIAGNOSIS — M542 Cervicalgia: Secondary | ICD-10-CM

## 2022-03-06 MED ORDER — CYANOCOBALAMIN 1000 MCG/ML IJ SOLN
1000.0000 ug | Freq: Once | INTRAMUSCULAR | Status: AC
  Administered 2022-03-06: 1000 ug via INTRAMUSCULAR

## 2022-03-06 NOTE — Progress Notes (Unsigned)
Established Patient Office Visit  Subjective   Patient ID: April Vega, female    DOB: 1971/04/20  Age: 51 y.o. MRN: [id number]  No chief complaint on file.   HPI  April Vega is a 51 year old female with a history of April Vega, DVT, April Vega, April Vega, April Vega, April Vega presenting for follow up of April Vega. She reports that she has been doing well. Denies any paresthesias in the hands or feet. Denies any excessive fatigue. She notes that she has had increasing right sided neck pain over the last 3 weeks. Denies any left arm weakness, numbness, or tingling. She is also having a similar pain on the left side of her neck that has not been there as long. Denies any headaches, fever, chills, nausea, vomiting, diarrhea, shortness of breath, chest pain.   Review of Systems  Musculoskeletal:  Positive for neck pain.  All other systems reviewed and are negative.     Objective:     BP 101/68   Pulse 68   Ht '5\' 4"'$  (1.626 m)   Wt 54.4 kg   SpO2 100%   BMI 20.60 kg/m  {Vitals History (Optional):23777}  Physical Exam Vitals reviewed.  Constitutional:      General: She is not in acute distress.    Appearance: Normal appearance. She is not ill-appearing.  HENT:     Head: Normocephalic and atraumatic.  Eyes:     General: No scleral icterus.    Extraocular Movements: Extraocular movements intact.     Conjunctiva/sclera: Conjunctivae normal.  Neck:     Comments: Firm, tender mass 2cm x 2cm noted on the right trapezius. Firm, tender mass1cm x 1cm noted on the left trapezius.  Cardiovascular:     Rate and Rhythm: Normal rate and regular rhythm.     Pulses: Normal pulses.     Heart sounds: Normal heart sounds.  Pulmonary:     Effort: Pulmonary effort is normal.     Breath sounds: Normal breath sounds.  Skin:    General: Skin is warm and dry.     Capillary Refill: Capillary refill takes less than 2  seconds.  Neurological:     Mental Status: She is alert and oriented to person, place, and time.  Psychiatric:        Mood and Affect: Mood normal.        Behavior: Behavior normal.      No results found for any visits on 03/06/22.  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    Allen Norris, [place]

## 2022-03-06 NOTE — Progress Notes (Unsigned)
Pt came in today for B12 injection. Injection given in LD pt tolerated well.  Pt reports no negative side effects from medication. Denies any dizziness, chest pain or palpitations, and no GI problems..   Advised to RTC in 30 days for next injection.

## 2022-03-09 ENCOUNTER — Encounter: Payer: Self-pay | Admitting: Rehabilitative and Restorative Service Providers"

## 2022-03-09 ENCOUNTER — Encounter: Payer: Self-pay | Admitting: Physician Assistant

## 2022-03-09 ENCOUNTER — Ambulatory Visit
Payer: Federal, State, Local not specified - PPO | Attending: Physician Assistant | Admitting: Rehabilitative and Restorative Service Providers"

## 2022-03-09 DIAGNOSIS — M539 Dorsopathy, unspecified: Secondary | ICD-10-CM | POA: Diagnosis not present

## 2022-03-09 DIAGNOSIS — M542 Cervicalgia: Secondary | ICD-10-CM | POA: Diagnosis not present

## 2022-03-09 DIAGNOSIS — R29898 Other symptoms and signs involving the musculoskeletal system: Secondary | ICD-10-CM | POA: Insufficient documentation

## 2022-03-09 DIAGNOSIS — R293 Abnormal posture: Secondary | ICD-10-CM | POA: Diagnosis not present
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
## 2022-03-09 NOTE — Patient Instructions (Addendum)
Trigger Point Dry Needling  What is Trigger Point Dry Needling (DN)? DN is a physical therapy technique used to treat muscle pain and dysfunction. Specifically, DN helps deactivate muscle trigger points (muscle knots).  A thin filiform needle is used to penetrate the skin and stimulate the underlying trigger point. The goal is for a local twitch response (LTR) to occur and for the trigger point to relax. No medication of any kind is injected during the procedure.   What Does Trigger Point Dry Needling Feel Like?  The procedure feels different for each individual patient. Some patients report that they do not actually feel the needle enter the skin and overall the process is not painful. Very mild bleeding may occur. However, many patients feel a deep cramping in the muscle in which the needle was inserted. This is the local twitch response.   How Will I feel after the treatment? Soreness is normal, and the onset of soreness may not occur for a few hours. Typically this soreness does not last longer than two days.  Bruising is uncommon, however; ice can be used to decrease any possible bruising.  In rare cases feeling tired or nauseous after the treatment is normal. In addition, your symptoms may get worse before they get better, this period will typically not last longer than 24 hours.   What Can I do After My Treatment? Increase your hydration by drinking more water for the next 24 hours. You may place ice or heat on the areas treated that have become sore, however, do not use heat on inflamed or bruised areas. Heat often brings more relief post needling. You can continue your regular activities, but vigorous activity is not recommended initially after the treatment for 24 hours. DN is best combined with other physical therapy such as strengthening, stretching, and other therapies.  Access Code: [email or web address] URL: [email or web address] Date: 03/09/2022 Prepared by: Gillermo Murdoch  Exercises - Seated Cervical Retraction  - 3 x daily - 7 x weekly - 1 sets - 10 reps - Standing Scapular Retraction  - 3 x daily - 7 x weekly - 1 sets - 10 reps - 10 hold - Shoulder External Rotation and Scapular Retraction  - 3 x daily - 7 x weekly - 1 sets - 10 reps - 3 sec   hold - Shoulder External Rotation in 45 Degrees Abduction  - 2 x daily - 7 x weekly - 1-2 sets - 10 reps - 3 sec  hold - Doorway Pec Stretch at 60 Degrees Abduction  - 3 x daily - 7 x weekly - 1 sets - 3 reps - Doorway Pec Stretch at 90 Degrees Abduction  - 3 x daily - 7 x weekly - 1 sets - 3 reps - 30 seconds  hold - Doorway Pec Stretch at 120 Degrees Abduction  - 3 x daily - 7 x weekly - 1 sets - 3 reps - 30 second hold  hold

## 2022-03-09 NOTE — Progress Notes (Signed)
Kidney and glucose looks great.  Hemoglobin is great. Serum iron is great.  Ferritin is very high! Do you have appt with hematology coming up?  B12 is too high as well. No more injections. You can take OTC 107mg daily recheck in 2-3 months.

## 2022-03-11 LAB — BASIC METABOLIC PANEL WITH GFR
BUN: 8 mg/dL (ref 7–25)
CO2: 27 mmol/L (ref 20–32)
Calcium: 9.9 mg/dL (ref 8.6–10.4)
Chloride: 105 mmol/L (ref 98–110)
Creat: 0.67 mg/dL (ref 0.50–1.03)
Glucose, Bld: 73 mg/dL (ref 65–99)
Potassium: 4.1 mmol/L (ref 3.5–5.3)
Sodium: 141 mmol/L (ref 135–146)
eGFR: 104 mL/min/{1.73_m2} (ref >=60)

## 2022-03-11 LAB — CBC WITH DIFFERENTIAL/PLATELET
Absolute Monocytes: 424 cells/uL (ref 200–950)
Basophils Absolute: 80 cells/uL (ref 0–200)
Basophils Relative: 1.9 %
Eosinophils Absolute: 181 cells/uL (ref 15–500)
Eosinophils Relative: 4.3 %
HCT: 41.8 % (ref 35.0–45.0)
Hemoglobin: 14 g/dL (ref 11.7–15.5)
Lymphs Abs: 1508 cells/uL (ref 850–3900)
MCH: 29.8 pg (ref 27.0–33.0)
MCHC: 33.5 g/dL (ref 32.0–36.0)
MCV: 88.9 fL (ref 80.0–100.0)
MPV: 11.2 fL (ref 7.5–12.5)
Monocytes Relative: 10.1 %
Neutro Abs: 2008 cells/uL (ref 1500–7800)
Neutrophils Relative %: 47.8 %
Platelets: 340 10*3/uL (ref 140–400)
RBC: 4.7 10*6/uL (ref 3.80–5.10)
RDW: 11.7 % (ref 11.0–15.0)
Total Lymphocyte: 35.9 %
WBC: 4.2 10*3/uL (ref 3.8–10.8)

## 2022-03-11 LAB — IRON,TIBC AND FERRITIN PANEL
%SAT: 25 % (calc) (ref 16–45)
Ferritin: 700 ng/mL — ABNORMAL HIGH (ref 16–232)
Iron: 55 ug/dL (ref 45–160)
TIBC: 218 mcg/dL (calc) — ABNORMAL LOW (ref 250–450)

## 2022-03-11 LAB — B12 AND FOLATE PANEL
Folate: 15.3 ng/mL
Vitamin B-12: >2000 pg/mL — ABNORMAL HIGH (ref 200–1100)

## 2022-03-11 LAB — VITAMIN B6: Vitamin B6: 26.3 ng/mL — ABNORMAL HIGH (ref 2.1–21.7)

## 2022-03-25 ENCOUNTER — Encounter: Payer: Self-pay | Admitting: Rehabilitative and Restorative Service Providers"

## 2022-03-25 ENCOUNTER — Ambulatory Visit
Payer: Federal, State, Local not specified - PPO | Attending: Physician Assistant | Admitting: Rehabilitative and Restorative Service Providers"

## 2022-03-25 DIAGNOSIS — M539 Dorsopathy, unspecified: Secondary | ICD-10-CM | POA: Diagnosis not present

## 2022-03-25 DIAGNOSIS — R29898 Other symptoms and signs involving the musculoskeletal system: Secondary | ICD-10-CM | POA: Insufficient documentation

## 2022-03-25 DIAGNOSIS — R293 Abnormal posture: Secondary | ICD-10-CM | POA: Insufficient documentation
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
## 2022-03-25 NOTE — Patient Instructions (Addendum)
TENS UNIT: This is helpful for muscle pain and spasm.   Search and Purchase a TENS 7000 2nd edition at www.tenspros.com. It should be less than $30.     TENS unit instructions: Do not shower or bathe with the unit on Turn the unit off before removing electrodes or batteries If the electrodes lose stickiness add a drop of water to the electrodes after they are disconnected from the unit and place on plastic sheet. If you continued to have difficulty, call the TENS unit company to purchase more electrodes. Do not apply lotion on the skin area prior to use. Make sure the skin is clean and dry as this will help prolong the life of the electrodes. After use, always check skin for unusual red areas, rash or other skin difficulties. If there are any skin problems, does not apply electrodes to the same area. Never remove the electrodes from the unit by pulling the wires. Do not use the TENS unit or electrodes other than as directed. Do not change electrode placement without consultating your therapist or physician. Keep 2 fingers with between each electrode.  Access Code: [email or web address] URL: [email or web address] Date: 03/25/2022 Prepared by: Gillermo Murdoch  Exercises - Seated Cervical Retraction  - 3 x daily - 7 x weekly - 1 sets - 10 reps - Standing Scapular Retraction  - 3 x daily - 7 x weekly - 1 sets - 10 reps - 10 hold - Shoulder External Rotation and Scapular Retraction  - 3 x daily - 7 x weekly - 1 sets - 10 reps - 3 sec   hold - Shoulder External Rotation in 45 Degrees Abduction  - 2 x daily - 7 x weekly - 1-2 sets - 10 reps - 3 sec  hold - Doorway Pec Stretch at 60 Degrees Abduction  - 3 x daily - 7 x weekly - 1 sets - 3 reps - Doorway Pec Stretch at 90 Degrees Abduction  - 3 x daily - 7 x weekly - 1 sets - 3 reps - 30 seconds  hold - Doorway Pec Stretch at 120 Degrees Abduction  - 3 x daily - 7 x weekly - 1 sets - 3 reps - 30 second hold  hold - Shoulder External Rotation and  Scapular Retraction with Resistance  - 2 x daily - 7 x weekly - 3 sets - 10 reps - Scapular Retraction with Resistance  - 1 x daily - 7 x weekly - 2-3 sets - 10 reps

## 2022-03-26 ENCOUNTER — Ambulatory Visit: Payer: Federal, State, Local not specified - PPO | Admitting: Rehabilitative and Restorative Service Providers"

## 2022-03-26 ENCOUNTER — Encounter: Payer: Self-pay | Admitting: Rehabilitative and Restorative Service Providers"

## 2022-03-26 DIAGNOSIS — M539 Dorsopathy, unspecified: Secondary | ICD-10-CM | POA: Diagnosis not present

## 2022-03-26 DIAGNOSIS — R29898 Other symptoms and signs involving the musculoskeletal system: Secondary | ICD-10-CM

## 2022-03-26 DIAGNOSIS — R293 Abnormal posture: Secondary | ICD-10-CM
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
## 2022-03-30 ENCOUNTER — Encounter: Payer: Self-pay | Admitting: Rehabilitative and Restorative Service Providers"

## 2022-04-01 ENCOUNTER — Ambulatory Visit: Payer: Federal, State, Local not specified - PPO | Admitting: Rehabilitative and Restorative Service Providers"

## 2022-04-01 ENCOUNTER — Encounter: Payer: Self-pay | Admitting: Rehabilitative and Restorative Service Providers"

## 2022-04-01 DIAGNOSIS — R29898 Other symptoms and signs involving the musculoskeletal system: Secondary | ICD-10-CM | POA: Diagnosis not present

## 2022-04-01 DIAGNOSIS — M539 Dorsopathy, unspecified: Secondary | ICD-10-CM | POA: Diagnosis not present

## 2022-04-01 DIAGNOSIS — R293 Abnormal posture: Secondary | ICD-10-CM

## 2022-04-01 NOTE — Patient Instructions (Addendum)
Access Code: [email or web address] URL: [email or web address] Date: 04/01/2022 Prepared by: Gillermo Murdoch  Exercises - Seated Cervical Retraction  - 3 x daily - 7 x weekly - 1 sets - 10 reps - Standing Scapular Retraction  - 3 x daily - 7 x weekly - 1 sets - 10 reps - 10 hold - Shoulder External Rotation and Scapular Retraction  - 3 x daily - 7 x weekly - 1 sets - 10 reps - 3 sec   hold - Shoulder External Rotation in 45 Degrees Abduction  - 2 x daily - 7 x weekly - 1-2 sets - 10 reps - 3 sec  hold - Doorway Pec Stretch at 60 Degrees Abduction  - 3 x daily - 7 x weekly - 1 sets - 3 reps - Doorway Pec Stretch at 90 Degrees Abduction  - 3 x daily - 7 x weekly - 1 sets - 3 reps - 30 seconds  hold - Doorway Pec Stretch at 120 Degrees Abduction  - 3 x daily - 7 x weekly - 1 sets - 3 reps - 30 second hold  hold - Shoulder External Rotation and Scapular Retraction with Resistance  - 2 x daily - 7 x weekly - 3 sets - 10 reps - Scapular Retraction with Resistance  - 1 x daily - 7 x weekly - 2-3 sets - 10 reps - Drawing Bow  - 1 x daily - 7 x weekly - 1 sets - 10 reps - 3 sec  hold -Antirotation - 1x/day - 10 reps 1-2 sets of 10
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
## 2022-04-03 ENCOUNTER — Encounter: Payer: Self-pay | Admitting: Rehabilitative and Restorative Service Providers"

## 2022-04-10 ENCOUNTER — Ambulatory Visit: Payer: Federal, State, Local not specified - PPO

## 2022-04-24 DIAGNOSIS — D509 Iron deficiency anemia, unspecified: Secondary | ICD-10-CM | POA: Diagnosis not present

## 2022-04-24 DIAGNOSIS — E538 Deficiency of other specified B group vitamins: Secondary | ICD-10-CM | POA: Diagnosis not present

## 2022-07-05 DIAGNOSIS — Z79899 Other long term (current) drug therapy: Secondary | ICD-10-CM | POA: Diagnosis not present

## 2022-07-05 DIAGNOSIS — M7989 Other specified soft tissue disorders: Secondary | ICD-10-CM | POA: Diagnosis not present

## 2022-07-05 DIAGNOSIS — Z86718 Personal history of other venous thrombosis and embolism: Secondary | ICD-10-CM | POA: Diagnosis not present

## 2022-07-05 DIAGNOSIS — Z888 Allergy status to other drugs, medicaments and biological substances status: Secondary | ICD-10-CM | POA: Diagnosis not present

## 2022-07-05 DIAGNOSIS — Z91040 Latex allergy status: Secondary | ICD-10-CM | POA: Diagnosis not present

## 2022-07-05 DIAGNOSIS — Z7982 Long term (current) use of aspirin: Secondary | ICD-10-CM | POA: Diagnosis not present

## 2022-07-06 ENCOUNTER — Other Ambulatory Visit: Payer: Self-pay | Admitting: Physician Assistant

## 2022-07-10 ENCOUNTER — Ambulatory Visit: Payer: Federal, State, Local not specified - PPO | Admitting: Physician Assistant

## 2022-07-10 ENCOUNTER — Ambulatory Visit (INDEPENDENT_AMBULATORY_CARE_PROVIDER_SITE_OTHER): Payer: Federal, State, Local not specified - PPO

## 2022-07-10 VITALS — BP 128/80 | HR 67 | Ht 64.0 in | Wt 123.0 lb

## 2022-07-10 DIAGNOSIS — K2970 Gastritis, unspecified, without bleeding: Secondary | ICD-10-CM

## 2022-07-10 DIAGNOSIS — M25472 Effusion, left ankle: Secondary | ICD-10-CM

## 2022-07-10 DIAGNOSIS — M25572 Pain in left ankle and joints of left foot: Secondary | ICD-10-CM | POA: Diagnosis not present

## 2022-07-10 MED ORDER — HYOSCYAMINE SULFATE 0.125 MG PO TBDP
0.1250 mg | ORAL_TABLET | Freq: Once | ORAL | Status: AC
  Administered 2022-07-10: 0.125 mg via SUBLINGUAL

## 2022-07-10 MED ORDER — OMEPRAZOLE 40 MG PO CPDR: DELAYED_RELEASE_CAPSULE | ORAL | 1 refills | Status: DC

## 2022-07-10 MED ORDER — LIDOCAINE VISCOUS HCL 2 % MT SOLN
15.0000 mL | Freq: Once | OROMUCOSAL | Status: AC
  Administered 2022-07-10: 15 mL via OROMUCOSAL

## 2022-07-10 MED ORDER — ALUM & MAG HYDROXIDE-SIMETH 200-200-20 MG/5ML PO SUSP
30.0000 mL | Freq: Once | ORAL | Status: AC
  Administered 2022-07-10: 30 mL via ORAL

## 2022-07-10 NOTE — Patient Instructions (Addendum)
Get xray Get labs Avoid overuse of ibuprofen Ice ankle Use voltaren gel Start omeprazole daily for 2 weeks then in combination with ibuprofen  Gastritis, Adult Gastritis is inflammation of the stomach. There are two kinds of gastritis: Acute gastritis. This kind develops suddenly. Chronic gastritis. This kind is much more common. It develops slowly and lasts for a long time. Gastritis happens when the lining of the stomach becomes weak or gets damaged. Without treatment, gastritis can lead to stomach bleeding and ulcers. What are the causes? This condition may be caused by: An infection. Drinking too much alcohol. Certain medicines. These include steroids, antibiotics, and some over-the-counter medicines, such as aspirin or ibuprofen. Having too much acid in the stomach. Having a disease of the stomach. Other causes may include: An allergic reaction. Some cancer treatments (radiation). Smoking cigarettes or the use of products that contain nicotine or tobacco. In some cases, the cause of this condition is not known. What increases the risk? Having a disease of the intestines. Having a disease in which the body's immune system attacks the body (autoimmune disease), such as Crohn's disease. Using aspirin or ibuprofen and other NSAIDs to treat other conditions, such as heart disease or chronic pain. Stress. What are the signs or symptoms? Symptoms of this condition include: Pain or a burning sensation in the upper abdomen. Nausea. Vomiting. An uncomfortable feeling of fullness after eating. Weight loss. Bad breath. Blood in your vomit or stool (feces). In some cases, there are no symptoms. How is this diagnosed? This condition may be diagnosed based on your medical history, a physical exam, and tests. Tests may include: Your medical history and a description of your symptoms. A physical exam. Tests. These can include: Blood tests. Stool tests. A test in which a thin,  flexible instrument with a light and a camera is passed down the esophagus and into the stomach (upper endoscopy). A test in which a tissue sample is removed to look at it under a microscope (biopsy). How is this treated? This condition may be treated with medicines. The medicines that are used vary depending on the cause of the gastritis. If the condition is caused by a bacterial infection, you may be given antibiotic medicines. If the condition is caused by too much acid in the stomach, you may be given medicines called H2 blockers, proton pump inhibitors, or antacids. Treatment may also involve stopping the use of certain medicines such as aspirin or ibuprofen and other NSAIDs. Follow these instructions at home: Medicines Take over-the-counter and prescription medicines only as told by your health care provider. If you were prescribed an antibiotic medicine, take it as told by your health care provider. Do not stop taking the antibiotic even if you start to feel better. Alcohol use Do not drink alcohol if: Your health care provider tells you not to drink. You are pregnant, may be pregnant, or are planning to become pregnant. If you drink alcohol: Limit your use to: 0-1 drink a day for women. 0-2 drinks a day for men. Know how much alcohol is in your drink. In the [place] one drink equals one 12 oz bottle of beer (355 mL), one 5 oz glass of wine (148 mL), or one 1 oz glass of hard liquor (44 mL). General instructions  Eat small, frequent meals instead of large meals. Avoid foods and drinks that make your symptoms worse. Talk with your health care provider about ways to manage stress, such as getting regular exercise or practicing deep breathing,  meditation, or yoga. Do not use any products that contain nicotine or tobacco. These products include cigarettes, chewing tobacco, and vaping devices, such as e-cigarettes. If you need help quitting, ask your health care provider. Drink enough  fluid to keep your urine pale yellow. Keep all follow-up visits. This is important. Contact a health care provider if: Your symptoms get worse. Your abdominal pain gets worse. Your symptoms return after treatment. You have a fever. Get help right away if: You vomit blood or a substance that looks like coffee grounds. You have black or dark red stools. You are unable to keep fluids down. These symptoms may represent a serious problem that is an emergency. Do not wait to see if the symptoms will go away. Get medical help right away. Call your local emergency services (911 in the [place] Do not drive yourself to the hospital. Summary Gastritis is inflammation of the lining of the stomach that can occur suddenly (acute) or develop slowly over time (chronic). This condition is diagnosed with a medical history, a physical exam, or tests. This condition may be treated with medicines to treat infection or medicines to reduce the amount of acid in your stomach. Follow your health care provider's instructions about taking medicines, making changes to your diet, and knowing when to call for help. This information is not intended to replace advice given to you by your health care provider. Make sure you discuss any questions you have with your health care provider. Document Revised: 01/11/2021 Document Reviewed: 01/11/2021 Elsevier Patient Education  [place].   Posterior Tibial Tendinitis Posterior tibial tendinitis is irritation of a tendon called the posterior tibial tendon. Your posterior tibial tendon is a cord-like tissue that connects bones of your lower leg and foot to a muscle that: Supports your arch. Helps you raise up on your toes. Helps you turn your foot down and in. This condition causes foot and ankle pain. It can also lead to a flat foot. What are the causes? This condition is most often caused by repeated stress to the tendon (overuse injury). It can also be caused by a  sudden injury that stresses the tendon, such as landing on your foot after jumping or falling. What increases the risk? This condition is more likely to develop in: People who play a sport that involves putting a lot of pressure on the feet, such as: Basketball. Tennis. Soccer. Hockey. Runners. Females who are older than 53 years of age and are overweight. People with diabetes. People with decreased foot stability. People with flat feet. What are the signs or symptoms? Symptoms include: Pain in the inner ankle. Pain at the arch of your foot. Pain that gets worse with running, walking, or standing. Swelling on the inside of your ankle and foot. Weakness in your ankle or foot. Inability to stand up on tiptoe. Flattening of the arch of your foot. How is this diagnosed? This condition may be diagnosed based on: Your symptoms. Your medical history. A physical exam. Tests, such as: X-ray. MRI. Ultrasound. How is this treated? This condition may be treated by: Putting ice to the injured area. Taking NSAIDs, such as ibuprofen, to reduce pain and swelling. Wearing a special shoe or shoe insert to support your arch (orthotic). Having physical therapy. Replacing high-impact exercise with low-impact exercise, such as swimming or cycling. If your symptoms do not improve with these treatments, you may need to wear a splint, removable walking boot, or short leg cast for 6-8 weeks to  keep your foot and ankle still (immobilized). Follow these instructions at home: If you have a cast, splint, or boot: Keep it clean and dry. Check the skin around it every day. Tell your health care provider about any concerns. If you have a cast: Do not stick anything inside it to scratch your skin. Doing that increases your risk of infection. You may put lotion on dry skin around the edges of the cast. Do not put lotion on the skin underneath the cast. If you have a splint or boot: Wear it as told by  your health care provider. Remove it only as told by your health care provider. Loosen it if your toes tingle, become numb, or turn cold and blue. Bathing Do not take baths, swim, or use a hot tub until your health care provider approves. Ask your health care provider if you may take showers. If your cast, splint, or boot is not waterproof: Do not let it get wet. Cover it with a waterproof covering while you take a bath or a shower. Managing pain and swelling  If directed, put ice on the injured area. If you have a removable splint or boot, remove it as told by your health care provider. Put ice in a plastic bag. Place a towel between your skin and the bag or between your cast and the bag. Leave the ice on for 20 minutes, 2-3 times a day. Move your toes often to reduce stiffness and swelling. Raise (elevate) the injured area above the level of your heart while you are sitting or lying down. Activity Do not use the injured foot to support your body weight until your health care provider says that you can. Use crutches as told by your health care provider. Do not do activities that make pain or swelling worse. Ask your health care provider when it is safe to drive if you have a cast, splint, or boot on your foot. Return to your normal activities as told by your health care provider. Ask your health care provider what activities are safe for you. Do exercises as told by your health care provider. General instructions Take over-the-counter and prescription medicines only as told by your health care provider. If you have an orthotic, use it as told by your health care provider. Keep all follow-up visits as told by your health care provider. This is important. How is this prevented? Wear footwear that is appropriate to your athletic activity. Avoid athletic activities that cause pain or swelling in your ankle or foot. Before being active, do range-of-motion and stretching exercises. If you  develop pain or swelling while training, stop training. If you have pain or swelling that does not improve after a few days of rest, see your health care provider. If you start a new athletic activity, start gradually so you can build up your strength and flexibility. Contact a health care provider if: Your symptoms get worse. Your symptoms do not improve in 6-8 weeks. You develop new, unexplained symptoms. Your splint, boot, or cast gets damaged. Summary Posterior tibial tendinitis is irritation of a tendon called the posterior tibial tendon. This condition is most often caused by repeated stress to the tendon (overuse injury). This condition causes foot pain and ankle pain. It can also lead to a flat foot. This condition may be treated by not doing high-impact activities, applying ice, having physical therapy, wearing orthotics, and wearing a cast, splint, or boot if needed. This information is not intended to replace advice  given to you by your health care provider. Make sure you discuss any questions you have with your health care provider. Document Revised: 01/03/2019 Document Reviewed: 11/10/2018 Elsevier Patient Education  [place].

## 2022-07-10 NOTE — Progress Notes (Unsigned)
   Established Patient Office Visit  Subjective   Patient ID: VALMA ROTENBERG, female    DOB: [birth year]  Age: 51 y.o. MRN: [id number]  No chief complaint on file.   HPI  Left foot no injury [place] negative for DVT  {History (Optional):23778}  ROS    Objective:     There were no vitals taken for this visit. BP Readings from Last 3 Encounters:  07/10/22 128/80  03/06/22 101/68  03/05/22 113/73   Wt Readings from Last 3 Encounters:  07/10/22 123 lb (55.8 kg)  03/06/22 120 lb (54.4 kg)  03/05/22 121 lb 12.8 oz (55.2 kg)      Physical Exam     Assessment & Plan:   No follow-ups on file.    Iran Planas, PA-C

## 2022-07-11 LAB — CBC WITH DIFFERENTIAL/PLATELET
Absolute Monocytes: 553 cells/uL (ref 200–950)
Basophils Absolute: 108 cells/uL (ref 0–200)
Basophils Relative: 1.9 %
Eosinophils Absolute: 439 cells/uL (ref 15–500)
Eosinophils Relative: 7.7 %
HCT: 38.7 % (ref 35.0–45.0)
Hemoglobin: 12.9 g/dL (ref 11.7–15.5)
Lymphs Abs: 1892 cells/uL (ref 850–3900)
MCH: 29.9 pg (ref 27.0–33.0)
MCHC: 33.3 g/dL (ref 32.0–36.0)
MCV: 89.8 fL (ref 80.0–100.0)
MPV: 10.5 fL (ref 7.5–12.5)
Monocytes Relative: 9.7 %
Neutro Abs: 2708 cells/uL (ref 1500–7800)
Neutrophils Relative %: 47.5 %
Platelets: 461 10*3/uL — ABNORMAL HIGH (ref 140–400)
RBC: 4.31 10*6/uL (ref 3.80–5.10)
RDW: 12.9 % (ref 11.0–15.0)
Total Lymphocyte: 33.2 %
WBC: 5.7 10*3/uL (ref 3.8–10.8)

## 2022-07-11 LAB — COMPLETE METABOLIC PANEL WITH GFR
AG Ratio: 1.6 (calc) (ref 1.0–2.5)
ALT: 24 U/L (ref 6–29)
AST: 16 U/L (ref 10–35)
Albumin: 4.6 g/dL (ref 3.6–5.1)
Alkaline phosphatase (APISO): 56 U/L (ref 37–153)
BUN: 9 mg/dL (ref 7–25)
CO2: 30 mmol/L (ref 20–32)
Calcium: 10.2 mg/dL (ref 8.6–10.4)
Chloride: 104 mmol/L (ref 98–110)
Creat: 0.55 mg/dL (ref 0.50–1.03)
Globulin: 2.8 g/dL (calc) (ref 1.9–3.7)
Glucose, Bld: 76 mg/dL (ref 65–99)
Potassium: 4.2 mmol/L (ref 3.5–5.3)
Sodium: 141 mmol/L (ref 135–146)
Total Bilirubin: 0.4 mg/dL (ref 0.2–1.2)
Total Protein: 7.4 g/dL (ref 6.1–8.1)
eGFR: 110 mL/min/{1.73_m2} (ref >=60)

## 2022-07-11 LAB — URIC ACID: Uric Acid, Serum: 4.5 mg/dL (ref 2.5–7.0)

## 2022-07-11 LAB — SEDIMENTATION RATE: Sed Rate: 9 mm/h (ref 0–30)

## 2022-07-13 ENCOUNTER — Encounter: Payer: Self-pay | Admitting: Physician Assistant

## 2022-07-13 DIAGNOSIS — M85872 Other specified disorders of bone density and structure, left ankle and foot: Secondary | ICD-10-CM | POA: Insufficient documentation

## 2022-07-13 NOTE — Progress Notes (Signed)
No significant arthritis. No fracture or dislocation. Your bones do look dense. Make sure you are getting encough calcium in diet and continue on vitamin D. I would like to do a bone density to look at all your bones. Is that ok to order?

## 2022-07-13 NOTE — Progress Notes (Signed)
Azure,   Kidney, glucose, liver look good.  Inflammation rate normal.  Uric acid normal.  Platelets are a little elevated. Continue your ASA daily.  Normal WBC.

## 2022-07-15 ENCOUNTER — Encounter: Payer: Self-pay | Admitting: Physician Assistant

## 2022-07-15 DIAGNOSIS — Z13820 Encounter for screening for osteoporosis: Secondary | ICD-10-CM

## 2022-07-15 DIAGNOSIS — M85872 Other specified disorders of bone density and structure, left ankle and foot: Secondary | ICD-10-CM

## 2022-07-20 DIAGNOSIS — H25813 Combined forms of age-related cataract, bilateral: Secondary | ICD-10-CM | POA: Diagnosis not present

## 2022-07-20 DIAGNOSIS — H04123 Dry eye syndrome of bilateral lacrimal glands: Secondary | ICD-10-CM | POA: Diagnosis not present

## 2022-07-31 DIAGNOSIS — Z1231 Encounter for screening mammogram for malignant neoplasm of breast: Secondary | ICD-10-CM | POA: Diagnosis not present

## 2022-07-31 LAB — HM MAMMOGRAPHY

## 2022-08-01 ENCOUNTER — Other Ambulatory Visit: Payer: Self-pay | Admitting: Physician Assistant

## 2022-08-01 DIAGNOSIS — K2970 Gastritis, unspecified, without bleeding: Secondary | ICD-10-CM

## 2022-08-12 ENCOUNTER — Ambulatory Visit (INDEPENDENT_AMBULATORY_CARE_PROVIDER_SITE_OTHER): Payer: Federal, State, Local not specified - PPO

## 2022-08-12 ENCOUNTER — Encounter: Payer: Self-pay | Admitting: Physician Assistant

## 2022-08-12 DIAGNOSIS — D509 Iron deficiency anemia, unspecified: Secondary | ICD-10-CM | POA: Diagnosis not present

## 2022-08-12 DIAGNOSIS — M85872 Other specified disorders of bone density and structure, left ankle and foot: Secondary | ICD-10-CM | POA: Diagnosis not present

## 2022-08-12 DIAGNOSIS — E538 Deficiency of other specified B group vitamins: Secondary | ICD-10-CM | POA: Diagnosis not present

## 2022-08-12 DIAGNOSIS — Z13820 Encounter for screening for osteoporosis: Secondary | ICD-10-CM

## 2022-08-12 DIAGNOSIS — M810 Age-related osteoporosis without current pathological fracture: Secondary | ICD-10-CM | POA: Diagnosis not present

## 2022-08-12 NOTE — Progress Notes (Signed)
Tscore is -3.0 and consider osteoporotic. Lets schedule a in person or virtual visit to discuss options.

## 2022-08-19 ENCOUNTER — Telehealth: Payer: Federal, State, Local not specified - PPO | Admitting: Physician Assistant

## 2022-08-24 ENCOUNTER — Encounter: Payer: Self-pay | Admitting: Physician Assistant

## 2022-08-28 ENCOUNTER — Other Ambulatory Visit: Payer: Self-pay | Admitting: Physician Assistant

## 2022-09-02 ENCOUNTER — Telehealth: Payer: Federal, State, Local not specified - PPO | Admitting: Physician Assistant

## 2022-09-09 ENCOUNTER — Ambulatory Visit: Payer: Federal, State, Local not specified - PPO | Admitting: Physician Assistant

## 2022-09-09 ENCOUNTER — Encounter: Payer: Self-pay | Admitting: Physician Assistant

## 2022-09-09 VITALS — BP 121/75 | HR 77 | Ht 64.0 in | Wt 123.1 lb

## 2022-09-09 DIAGNOSIS — M816 Localized osteoporosis [Lequesne]: Secondary | ICD-10-CM | POA: Diagnosis not present

## 2022-09-09 DIAGNOSIS — F909 Attention-deficit hyperactivity disorder, unspecified type: Secondary | ICD-10-CM

## 2022-09-09 MED ORDER — LISDEXAMFETAMINE DIMESYLATE 40 MG PO CAPS: 40.0000 mg | ORAL_CAPSULE | ORAL | 0 refills | Status: DC
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
## 2022-09-09 NOTE — Patient Instructions (Addendum)
Start vyvanse.   Osteoporosis  Osteoporosis happens when the bones become thin and less dense than normal. Osteoporosis makes bones more brittle and fragile and more likely to break (fracture). Over time, osteoporosis can cause your bones to become so weak that they fracture after a minor fall. Bones in the hip, wrist, and spine are most likely to fracture due to osteoporosis. What are the causes? The exact cause of this condition is not known. What increases the risk? You are more likely to develop this condition if you: Have family members with this condition. Have poor nutrition. Use the following: Steroid medicines, such as prednisone. Anti-seizure medicines. Nicotine or tobacco, such as cigarettes, e-cigarettes, and chewing tobacco. Are female. Are age 61 or older. Are not physically active (are sedentary). Are of European or Asian descent. Have a small body frame. What are the signs or symptoms? A fracture might be the first sign of osteoporosis, especially if the fracture results from a fall or injury that usually would not cause a bone to break. Other signs and symptoms include: Pain in the neck or low back. Stooped posture. Loss of height. How is this diagnosed? This condition may be diagnosed based on: Your medical history. A physical exam. A bone mineral density test, also called a DXA or DEXA test (dual-energy X-ray absorptiometry test). This test uses X-rays to measure the amount of minerals in your bones. How is this treated? This condition may be treated by: Making lifestyle changes, such as: Including foods with more calcium and vitamin D in your diet. Doing weight-bearing and muscle-strengthening exercises. Stopping tobacco use. Limiting alcohol intake. Taking medicine to slow the process of bone loss or to increase bone density. Taking daily supplements of calcium and vitamin D. Taking hormone replacement medicines, such as estrogen for women and testosterone  for men. Monitoring your levels of calcium and vitamin D. The goal of treatment is to strengthen your bones and lower your risk for a fracture. Follow these instructions at home: Eating and drinking Include calcium and vitamin D in your diet. Calcium is important for bone health, and vitamin D helps your body absorb calcium. Good sources of calcium and vitamin D include: Certain fatty fish, such as salmon and tuna. Products that have calcium and vitamin D added to them (are fortified), such as fortified cereals. Egg yolks. Cheese. Liver.  Activity Do exercises as told by your health care provider. Ask your health care provider what exercises and activities are safe for you. You should do: Exercises that make you work against gravity (weight-bearing exercises), such as tai chi, yoga, or walking. Exercises to strengthen muscles, such as lifting weights. Lifestyle Do not drink alcohol if: Your health care provider tells you not to drink. You are pregnant, may be pregnant, or are planning to become pregnant. If you drink alcohol: Limit how much you use to: 0-1 drink a day for women. 0-2 drinks a day for men. Know how much alcohol is in your drink. In the [place] one drink equals one 12 oz bottle of beer (355 mL), one 5 oz glass of wine (148 mL), or one 1 oz glass of hard liquor (44 mL). Do not use any products that contain nicotine or tobacco, such as cigarettes, e-cigarettes, and chewing tobacco. If you need help quitting, ask your health care provider. Preventing falls Use devices to help you move around (mobility aids) as needed, such as canes, walkers, scooters, or crutches. Keep rooms well-lit and clutter-free. Remove tripping hazards from  walkways, including cords and throw rugs. Install grab bars in bathrooms and safety rails on stairs. Use rubber mats in the bathroom and other areas that are often wet or slippery. Wear closed-toe shoes that fit well and support your feet. Wear  shoes that have rubber soles or low heels. Review your medicines with your health care provider. Some medicines can cause dizziness or changes in blood pressure, which can increase your risk of falling. General instructions Take over-the-counter and prescription medicines only as told by your health care provider. Keep all follow-up visits. This is important. Contact a health care provider if: You have never been screened for osteoporosis and you are: A woman who is age 22 or older. A man who is age 5 or older. Get help right away if: You fall or injure yourself. Summary Osteoporosis is thinning and loss of density in your bones. This makes bones more brittle and fragile and more likely to break (fracture),even with minor falls. The goal of treatment is to strengthen your bones and lower your risk for a fracture. Include calcium and vitamin D in your diet. Calcium is important for bone health, and vitamin D helps your body absorb calcium. Talk with your health care provider about screening for osteoporosis if you are a woman who is age 73 or older, or a man who is age 31 or older. This information is not intended to replace advice given to you by your health care provider. Make sure you discuss any questions you have with your health care provider. Document Revised: 02/22/2020 Document Reviewed: 02/22/2020 Elsevier Patient Education  [place].

## 2022-09-15 ENCOUNTER — Telehealth: Payer: Self-pay

## 2022-09-15 ENCOUNTER — Encounter: Payer: Self-pay | Admitting: Physician Assistant

## 2022-09-15 NOTE — Telephone Encounter (Signed)
Initiated Prior authorization WIO:MBTDHR '60MG'$ /ML syringes Via: Covermymeds Case/Key:[id number] Status: approved  as of 09/15/22 Reason:The authorization is valid from 08/16/2022 through 09/15/2023. A letter of explanation will also be mailed to the patient. Notified Pt via: Mychart Called pt left a detailed vm   Cost breakdown:  $185( pt is eligible for additional saving on [email or web address]) [facility]  phone  [phone]  Unable to reach pt to see if she will proceed with this injection

## 2022-09-16 ENCOUNTER — Ambulatory Visit (INDEPENDENT_AMBULATORY_CARE_PROVIDER_SITE_OTHER): Payer: Federal, State, Local not specified - PPO | Admitting: Physician Assistant

## 2022-09-16 ENCOUNTER — Ambulatory Visit (INDEPENDENT_AMBULATORY_CARE_PROVIDER_SITE_OTHER): Payer: Federal, State, Local not specified - PPO

## 2022-09-16 VITALS — BP 119/72 | HR 80 | Ht 64.0 in | Wt 125.0 lb

## 2022-09-16 DIAGNOSIS — Z86718 Personal history of other venous thrombosis and embolism: Secondary | ICD-10-CM | POA: Diagnosis not present

## 2022-09-16 DIAGNOSIS — Z79899 Other long term (current) drug therapy: Secondary | ICD-10-CM

## 2022-09-16 DIAGNOSIS — M25562 Pain in left knee: Secondary | ICD-10-CM

## 2022-09-16 DIAGNOSIS — M25462 Effusion, left knee: Secondary | ICD-10-CM

## 2022-09-16 LAB — CBC WITH DIFFERENTIAL/PLATELET
Absolute Monocytes: 502 cells/uL (ref 200–950)
Basophils Absolute: 92 cells/uL (ref 0–200)
Basophils Relative: 1.7 %
Eosinophils Absolute: 259 cells/uL (ref 15–500)
Eosinophils Relative: 4.8 %
HCT: 38.5 % (ref 35.0–45.0)
Hemoglobin: 12.6 g/dL (ref 11.7–15.5)
Lymphs Abs: 1949 cells/uL (ref 850–3900)
MCH: 29.2 pg (ref 27.0–33.0)
MCHC: 32.7 g/dL (ref 32.0–36.0)
MCV: 89.1 fL (ref 80.0–100.0)
MPV: 10.2 fL (ref 7.5–12.5)
Monocytes Relative: 9.3 %
Neutro Abs: 2597 cells/uL (ref 1500–7800)
Neutrophils Relative %: 48.1 %
Platelets: 364 10*3/uL (ref 140–400)
RBC: 4.32 10*6/uL (ref 3.80–5.10)
RDW: 11.8 % (ref 11.0–15.0)
Total Lymphocyte: 36.1 %
WBC: 5.4 10*3/uL (ref 3.8–10.8)

## 2022-09-16 LAB — COMPLETE METABOLIC PANEL WITH GFR
AG Ratio: 1.6 (calc) (ref 1.0–2.5)
ALT: 15 U/L (ref 6–29)
AST: 13 U/L (ref 10–35)
Albumin: 4.5 g/dL (ref 3.6–5.1)
Alkaline phosphatase (APISO): 51 U/L (ref 37–153)
BUN: 9 mg/dL (ref 7–25)
CO2: 28 mmol/L (ref 20–32)
Calcium: 9.7 mg/dL (ref 8.6–10.4)
Chloride: 106 mmol/L (ref 98–110)
Creat: 0.59 mg/dL (ref 0.50–1.03)
Globulin: 2.8 g/dL (calc) (ref 1.9–3.7)
Glucose, Bld: 78 mg/dL (ref 65–99)
Potassium: 4 mmol/L (ref 3.5–5.3)
Sodium: 141 mmol/L (ref 135–146)
Total Bilirubin: 0.4 mg/dL (ref 0.2–1.2)
Total Protein: 7.3 g/dL (ref 6.1–8.1)
eGFR: 108 mL/min/{1.73_m2} (ref >=60)

## 2022-09-16 LAB — D-DIMER, QUANTITATIVE: D-Dimer, Quant: <0.19 mcg/mL FEU (ref <0.50)

## 2022-09-16 LAB — SEDIMENTATION RATE: Sed Rate: 6 mm/h (ref 0–30)

## 2022-09-16 LAB — URIC ACID: Uric Acid, Serum: 3.8 mg/dL (ref 2.5–7.0)

## 2022-09-16 MED ORDER — DICLOFENAC SODIUM 75 MG PO TBEC: 75.0000 mg | DELAYED_RELEASE_TABLET | Freq: Two times a day (BID) | ORAL | 0 refills | Status: DC
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
## 2022-09-16 NOTE — Patient Instructions (Signed)
Get xray Get labs Diclofenac up to twice a day

## 2022-09-17 NOTE — Progress Notes (Signed)
[place],   D-dimer normal which is great news for very low risk of blood clot.  Kidney, liver look good and need to have prolia shot in the next 30 days.  Uric acid is normal.  WBC is normal. No sign of infection.

## 2022-09-17 NOTE — Telephone Encounter (Signed)
Patient scheduled.

## 2022-09-18 ENCOUNTER — Ambulatory Visit (INDEPENDENT_AMBULATORY_CARE_PROVIDER_SITE_OTHER): Payer: Federal, State, Local not specified - PPO | Admitting: Family Medicine

## 2022-09-18 VITALS — BP 126/67 | HR 74 | Ht 64.0 in | Wt 127.0 lb

## 2022-09-18 DIAGNOSIS — M816 Localized osteoporosis [Lequesne]: Secondary | ICD-10-CM | POA: Diagnosis not present

## 2022-09-18 MED ORDER — DENOSUMAB 60 MG/ML ~~LOC~~ SOSY
60.0000 mg | PREFILLED_SYRINGE | Freq: Once | SUBCUTANEOUS | Status: AC
  Administered 2022-09-18: 60 mg via SUBCUTANEOUS

## 2022-09-18 NOTE — Progress Notes (Signed)
Pt is here for Prolia injection. She had labs done prior to her visit today (12/27). She is taking 1200 mg of Calcium daily and Vitamin D weekly.   Injection given in L arm pt tolerated well. Advised that she may have some pain at the injection site and told that should she experience any other adverse reactions to contact our office. She voiced understanding and agreed.   She will RTC in 6 months for her next injection. She was advised that she would need to have labs done again before her next injection.

## 2022-09-18 NOTE — Progress Notes (Signed)
Agree with documentation as above.   Zema Lizardo, MD  

## 2022-09-18 NOTE — Progress Notes (Signed)
No acute findings.  There are some changes in lucency in one of the screws of distal femur.? If could be some loosening. I would suggest follow up with orthopedist who did the surgery if possible, if pain the the medial knee persist.

## 2022-09-22 ENCOUNTER — Encounter: Payer: Self-pay | Admitting: Physician Assistant

## 2022-09-24 ENCOUNTER — Encounter: Payer: Self-pay | Admitting: Physician Assistant

## 2022-09-24 NOTE — Telephone Encounter (Signed)
Does this need PA? It has been sent.

## 2022-09-25 ENCOUNTER — Telehealth: Payer: Self-pay

## 2022-09-25 NOTE — Telephone Encounter (Signed)
Initiated Prior authorization ZZC:[id number] '40MG'$  capsules Via: Covermymeds Case/Key:[id number] Status: approved  as of 09/25/21 Reason:The authorization is valid from 08/26/2022 to 09/25/2023. Notified Pt via: Mychart

## 2022-09-29 ENCOUNTER — Other Ambulatory Visit: Payer: Self-pay | Admitting: Physician Assistant

## 2022-09-29 DIAGNOSIS — K2970 Gastritis, unspecified, without bleeding: Secondary | ICD-10-CM

## 2022-10-05 ENCOUNTER — Encounter: Payer: Self-pay | Admitting: Physician Assistant

## 2022-10-07 MED ORDER — AMPHETAMINE-DEXTROAMPHET ER 15 MG PO CP24: 15.0000 mg | ORAL_CAPSULE | ORAL | 0 refills | Status: DC

## 2022-10-13 ENCOUNTER — Other Ambulatory Visit: Payer: Self-pay | Admitting: Physician Assistant

## 2022-10-13 DIAGNOSIS — M25562 Pain in left knee: Secondary | ICD-10-CM

## 2022-10-13 DIAGNOSIS — M25462 Effusion, left knee: Secondary | ICD-10-CM

## 2022-10-15 ENCOUNTER — Encounter: Payer: Self-pay | Admitting: Physician Assistant

## 2022-10-16 ENCOUNTER — Telehealth: Payer: Self-pay

## 2022-10-16 NOTE — Telephone Encounter (Signed)
Initiated Prior authorization HKU:[id number] '15MG'$  er capsules Via: Covermymeds Case/Key:[id number]  Status: approved as of 10/16/22 Reason:The authorization is valid from 09/16/2022 to 10/16/2023. Notified Pt via: Mychart

## 2022-10-23 DIAGNOSIS — Z1330 Encounter for screening examination for mental health and behavioral disorders, unspecified: Secondary | ICD-10-CM | POA: Diagnosis not present

## 2022-10-23 DIAGNOSIS — E538 Deficiency of other specified B group vitamins: Secondary | ICD-10-CM | POA: Diagnosis not present

## 2022-10-23 DIAGNOSIS — D509 Iron deficiency anemia, unspecified: Secondary | ICD-10-CM | POA: Diagnosis not present

## 2022-10-26 ENCOUNTER — Encounter: Payer: Self-pay | Admitting: Physician Assistant

## 2022-10-26 DIAGNOSIS — R6889 Other general symptoms and signs: Secondary | ICD-10-CM

## 2022-10-26 DIAGNOSIS — M25562 Pain in left knee: Secondary | ICD-10-CM

## 2022-10-26 DIAGNOSIS — Z86718 Personal history of other venous thrombosis and embolism: Secondary | ICD-10-CM

## 2022-10-26 DIAGNOSIS — Z79899 Other long term (current) drug therapy: Secondary | ICD-10-CM

## 2022-10-30 ENCOUNTER — Encounter: Payer: Self-pay | Admitting: Physician Assistant

## 2022-11-09 ENCOUNTER — Other Ambulatory Visit: Payer: Self-pay | Admitting: Physician Assistant

## 2022-11-09 DIAGNOSIS — Z79899 Other long term (current) drug therapy: Secondary | ICD-10-CM | POA: Diagnosis not present

## 2022-11-09 DIAGNOSIS — M25562 Pain in left knee: Secondary | ICD-10-CM | POA: Diagnosis not present

## 2022-11-09 DIAGNOSIS — Z86718 Personal history of other venous thrombosis and embolism: Secondary | ICD-10-CM | POA: Diagnosis not present

## 2022-11-09 DIAGNOSIS — M25462 Effusion, left knee: Secondary | ICD-10-CM

## 2022-11-09 DIAGNOSIS — R6889 Other general symptoms and signs: Secondary | ICD-10-CM | POA: Diagnosis not present

## 2022-11-11 LAB — TSH: TSH: 1.14 mIU/L

## 2022-11-11 LAB — ANTI-NUCLEAR AB-TITER (ANA TITER): ANA Titer 1: 1:80 {titer} — ABNORMAL HIGH

## 2022-11-11 LAB — C-REACTIVE PROTEIN: CRP: 0.6 mg/L (ref <8.0)

## 2022-11-11 LAB — ANA: Anti Nuclear Antibody (ANA): POSITIVE — AB

## 2022-11-11 LAB — SED RATE MANUAL WEST RFLX: SED RATE BY MODIFIED WESTERGREN,MANUAL: 2 mm/h (ref 0–30)

## 2022-11-11 LAB — SEDIMENTATION RATE

## 2022-11-11 NOTE — Progress Notes (Signed)
Sed rate and TSH are normal. ANA pending.

## 2022-11-12 NOTE — Progress Notes (Signed)
ANA is elevated but at a very low antibody level which likely means false positive.  Any other symptoms abnormal issues to evaluate?

## 2022-11-13 ENCOUNTER — Encounter: Payer: Self-pay | Admitting: Physician Assistant

## 2022-11-13 ENCOUNTER — Other Ambulatory Visit: Payer: Self-pay | Admitting: Physician Assistant

## 2022-11-13 DIAGNOSIS — R7989 Other specified abnormal findings of blood chemistry: Secondary | ICD-10-CM

## 2022-11-13 DIAGNOSIS — R768 Other specified abnormal immunological findings in serum: Secondary | ICD-10-CM

## 2022-11-13 DIAGNOSIS — K909 Intestinal malabsorption, unspecified: Secondary | ICD-10-CM

## 2022-11-13 DIAGNOSIS — R898 Other abnormal findings in specimens from other organs, systems and tissues: Secondary | ICD-10-CM

## 2022-11-13 MED ORDER — AMPHETAMINE-DEXTROAMPHET ER 15 MG PO CP24: 15.0000 mg | ORAL_CAPSULE | ORAL | 0 refills | Status: DC

## 2022-11-13 NOTE — Telephone Encounter (Signed)
Reqeusting rx rf of Adderall XR 15 Last written 10/07/2022 Last OV 09/09/2022 No upcoming schld visit with provider.

## 2022-11-20 ENCOUNTER — Encounter: Payer: Self-pay | Admitting: Physician Assistant

## 2022-11-20 DIAGNOSIS — R898 Other abnormal findings in specimens from other organs, systems and tissues: Secondary | ICD-10-CM

## 2022-11-20 DIAGNOSIS — R7989 Other specified abnormal findings of blood chemistry: Secondary | ICD-10-CM

## 2022-11-20 DIAGNOSIS — D509 Iron deficiency anemia, unspecified: Secondary | ICD-10-CM

## 2022-11-20 DIAGNOSIS — K909 Intestinal malabsorption, unspecified: Secondary | ICD-10-CM

## 2022-11-20 DIAGNOSIS — E538 Deficiency of other specified B group vitamins: Secondary | ICD-10-CM

## 2022-11-23 DIAGNOSIS — M359 Systemic involvement of connective tissue, unspecified: Secondary | ICD-10-CM | POA: Diagnosis not present

## 2022-11-23 DIAGNOSIS — M810 Age-related osteoporosis without current pathological fracture: Secondary | ICD-10-CM | POA: Diagnosis not present

## 2022-11-23 DIAGNOSIS — R768 Other specified abnormal immunological findings in serum: Secondary | ICD-10-CM | POA: Diagnosis not present

## 2022-11-23 DIAGNOSIS — R7989 Other specified abnormal findings of blood chemistry: Secondary | ICD-10-CM | POA: Diagnosis not present

## 2022-11-23 LAB — C4 COMPLEMENT: C4 Complement: 27

## 2022-11-26 ENCOUNTER — Telehealth: Payer: Self-pay | Admitting: Physician Assistant

## 2022-11-26 NOTE — Telephone Encounter (Signed)
scheduled per 3/6 referral, pt has been called and confirmed date and time. Pt is aware of location and to arrive early for check in

## 2022-11-27 LAB — C3 COMPLEMENT: C3 Complement: 121

## 2022-12-21 NOTE — Progress Notes (Unsigned)
[facility] [phone]   Fax:([phone]) [place] NOTE  Patient Care Team: Lavada Mesi as PCP - General (Family Medicine) Silverio Decamp, MD as Consulting Physician (Family Medicine)  Hematological/Oncological History # ***  CHIEF COMPLAINTS/PURPOSE OF CONSULTATION:  Elevated Ferritin  HISTORY OF PRESENTING ILLNESS:  April Vega 52 y.o. female with medical history significant for ***  On review of the previous records ***  On exam today ***  MEDICAL HISTORY:  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   Menometrorrhagia 01/02/2016    SURGICAL HISTORY: Past Surgical History:  Procedure Laterality Date   bone graph  01/1990   CESAREAN SECTION     ENDOMETRIAL ABLATION W/ NOVASURE  2019   INTRAVASCULAR ULTRASOUND/IVUS Left 05/12/2017   Procedure: Intravascular Ultrasound/IVUS;  Surgeon: Waynetta Sandy, MD;  Location: [facility];  Service: Cardiovascular;  Laterality: Left;  IVC TO LT POPLITEAL VEIN   INTRAVASCULAR ULTRASOUND/IVUS N/A 03/07/2018   Procedure: INTRAVASCULAR ULTRASOUND/IVUS;  Surgeon: Waynetta Sandy, MD;  Location: [facility];  Service: Cardiovascular;  Laterality: N/A;   LOWER EXTREMITY VENOGRAPHY Bilateral 05/11/2017   Procedure: Bilateral Lower Extremity Venography;  Surgeon: Serafina Mitchell, MD;  Location: [facility];  Service: Cardiovascular;  Laterality: Bilateral;   LOWER EXTREMITY VENOGRAPHY N/A 03/07/2018   Procedure: LOWER EXTREMITY VENOGRAPHY;  Surgeon: Waynetta Sandy, MD;  Location: [facility];  Service: Cardiovascular;  Laterality: N/A;   PERIPHERAL VASCULAR BALLOON ANGIOPLASTY Left 03/07/2018   Procedure: PERIPHERAL VASCULAR BALLOON ANGIOPLASTY;   Surgeon: Waynetta Sandy, MD;  Location: [facility];  Service: Cardiovascular;  Laterality: Left;  left common iliac vein   PERIPHERAL VASCULAR INTERVENTION Left 05/12/2017   Procedure: PERIPHERAL VASCULAR INTERVENTION;  Surgeon: Waynetta Sandy, MD;  Location: [facility];  Service: Cardiovascular;  Laterality: Left;  IVC TO LT COMMON FEM VEIN  STENT   PERIPHERAL VASCULAR INTERVENTION Right 03/07/2018   Procedure: PERIPHERAL VASCULAR INTERVENTION;  Surgeon: Waynetta Sandy, MD;  Location: [facility];  Service: Cardiovascular;  Laterality: Right;  right common iliac vein   PERIPHERAL VASCULAR THROMBECTOMY Left 05/11/2017   Procedure: PERIPHERAL VASCULAR THROMBECTOMY;  Surgeon: Serafina Mitchell, MD;  Location: [facility];  Service: Cardiovascular;  Laterality: Left;  left lower extremity venous    SOCIAL HISTORY: Social History   Socioeconomic History   Marital status: Married    Spouse name: Not on file   Number of children: Not on file   Years of education: Not on file   Highest education level: Not on file  Occupational History   Occupation: teacher  Tobacco Use   Smoking status: Never    Passive exposure: Never   Smokeless tobacco: Never  Vaping Use   Vaping Use: Never used  Substance and Sexual Activity   Alcohol use: No    Alcohol/week: 0.0 standard drinks of alcohol   Drug use: No   Sexual activity: Yes    Partners: Male    Birth control/protection: None    Comment: Occ use condoms  Other Topics Concern   Not on file  Social History Narrative   Not on file   Social Determinants of Health   Financial Resource Strain: Not  on file  Food Insecurity: Not on file  Transportation Needs: Not on file  Physical Activity: Not on file  Stress: Not on file  Social Connections: Not on file  Intimate Partner Violence: Not on file    FAMILY HISTORY: Family History  Adopted: Yes    ALLERGIES:  is allergic to  wellbutrin [bupropion] and latex.  MEDICATIONS:  Current Outpatient Medications  Medication Sig Dispense Refill   amphetamine-dextroamphetamine (ADDERALL XR) 15 MG 24 hr capsule Take 1 capsule by mouth every morning. 30 capsule 0   aspirin 81 MG tablet Take by mouth.     diclofenac (VOLTAREN) 75 MG EC tablet TAKE 1 TABLET BY MOUTH TWICE A DAY 60 tablet 0   iron polysaccharides (NIFEREX) 150 MG capsule Take 150 mg by mouth 2 (two) times daily.     lisdexamfetamine (VYVANSE) 40 MG capsule Take 1 capsule (40 mg total) by mouth every morning. 30 capsule 0   omeprazole (PRILOSEC) 40 MG capsule Take once a day for 2 weeks then as needed in combination with any ibuprofen use. 90 capsule 3   RESTASIS 0.05 % ophthalmic emulsion      Vitamin D, Ergocalciferol, (DRISDOL) 1.25 MG (50000 UNIT) CAPS capsule TAKE 1 CAPSULE BY MOUTH ONE TIME PER WEEK 12 capsule 3   No current facility-administered medications for this visit.    REVIEW OF SYSTEMS:   Constitutional: ( - ) fevers, ( - )  chills , ( - ) night sweats Eyes: ( - ) blurriness of vision, ( - ) double vision, ( - ) watery eyes Ears, nose, mouth, throat, and face: ( - ) mucositis, ( - ) sore throat Respiratory: ( - ) cough, ( - ) dyspnea, ( - ) wheezes Cardiovascular: ( - ) palpitation, ( - ) chest discomfort, ( - ) lower extremity swelling Gastrointestinal:  ( - ) nausea, ( - ) heartburn, ( - ) change in bowel habits Skin: ( - ) abnormal skin rashes Lymphatics: ( - ) new lymphadenopathy, ( - ) easy bruising Neurological: ( - ) numbness, ( - ) tingling, ( - ) new weaknesses Behavioral/Psych: ( - ) mood change, ( - ) new changes  All other systems were reviewed with the patient and are negative.  PHYSICAL EXAMINATION: ECOG PERFORMANCE STATUS: {CHL ONC ECOG [id number]}  There were no vitals filed for this visit. There were no vitals filed for this visit.  GENERAL: well appearing *** in NAD  SKIN: skin color, texture, turgor are normal, no  rashes or significant lesions EYES: conjunctiva are pink and non-injected, sclera clear OROPHARYNX: no exudate, no erythema; lips, buccal mucosa, and tongue normal  NECK: supple, non-tender LYMPH:  no palpable lymphadenopathy in the cervical, axillary or supraclavicular lymph nodes.  LUNGS: clear to auscultation and percussion with normal breathing effort HEART: regular rate & rhythm and no murmurs and no lower extremity edema ABDOMEN: soft, non-tender, non-distended, normal bowel sounds Musculoskeletal: no cyanosis of digits and no clubbing  PSYCH: alert & oriented x 3, fluent speech NEURO: no focal motor/sensory deficits  LABORATORY DATA:  I have reviewed the data as listed    Latest Ref Rng & Units 09/16/2022    2:53 PM 07/10/2022   12:00 AM 03/06/2022   12:00 AM  CBC  WBC 3.8 - 10.8 Thousand/uL 5.4  5.7  4.2   Hemoglobin 11.7 - 15.5 g/dL 12.6  12.9  14.0   Hematocrit 35.0 - 45.0 % 38.5  38.7  41.8   Platelets 140 -  400 Thousand/uL 364  461  340        Latest Ref Rng & Units 09/16/2022    2:53 PM 07/10/2022   12:00 AM 03/06/2022   12:00 AM  CMP  Glucose 65 - 99 mg/dL 78  76  73   BUN 7 - 25 mg/dL 9  9  8    Creatinine 0.50 - 1.03 mg/dL 0.59  0.55  0.67   Sodium 135 - 146 mmol/L 141  141  141   Potassium 3.5 - 5.3 mmol/L 4.0  4.2  4.1   Chloride 98 - 110 mmol/L 106  104  105   CO2 20 - 32 mmol/L 28  30  27    Calcium 8.6 - 10.4 mg/dL 9.7  10.2  9.9   Total Protein 6.1 - 8.1 g/dL 7.3  7.4    Total Bilirubin 0.2 - 1.2 mg/dL 0.4  0.4    AST 10 - 35 U/L 13  16    ALT 6 - 29 U/L 15  24       PATHOLOGY: ***  BLOOD FILM: *** Review of the peripheral blood smear showed normal appearing white cells with neutrophils that were appropriately lobated and granulated. There was no predominance of bi-lobed or hyper-segmented neutrophils appreciated. No Dohle bodies were noted. There was no left shifting, immature forms or blasts noted. Lymphocytes remain normal in size without any  predominance of large granular lymphocytes. Red cells show no anisopoikilocytosis, macrocytes , microcytes or polychromasia. There were no schistocytes, target cells, echinocytes, acanthocytes, dacrocytes, or stomatocytes.There was no rouleaux formation, nucleated red cells, or intra-cellular inclusions noted. The platelets are normal in size, shape, and color without any clumping evident.  RADIOGRAPHIC STUDIES: I have personally reviewed the radiological images as listed and agreed with the findings in the report. No results found.  ASSESSMENT & PLAN *** Elevated serum ferritin levels have numerous possible etiologies. These include hereditary hemochromatosis (heterozygous or homozygous), inflammation, liver disease, or iron overload from an exogenous source. Hereditary hemochromatosis is a hereditary condition caused by mutations in the HFE gene, which regulates iron absorption. The most common genes mutated in this condition are the C282Y and H63D genes. Homozygous mutations represent a disease state which requires phlebotomy to decrease ferritin levels to a goal of <50  (Blood (2010) 116 (3): 317-325). The goal is to decrease ferritin so there is no deposition in critical organs (liver, heart, pancreas and thyroid). Heterozygous mutations (or compound heterozygotes) rarely require phlebotomy, but do have elevated serum iron/ferritin levels.  Ferritin is an acute phase reactant and can be elevated with systemic inflammation. Direct damage to liver tissue can also cause spillage of ferritin into the blood, resulting in elevated ferritin.  Additionally, serum iron levels can be quite transient and an elevation or serum iron may not represent a true overload of total body iron (best lab for this is ferritin).   # Elevated Iron/Ferritin --labs to include CBC, CMP, LDH, ESR, CRP --will repeat iron panel and ferritin today --will send for HFE gene mutation. If found to have homozygous mutation for HFE will  begin phlebotomies every other week with goal ferritin <50  --will order US liver to assess for liver disease --if patient confirmed to have hereditary hemochromatosis will order TSH, TTE, and Hepatitis B/C panels.  --RTC pending results of above studies.    No orders of the defined types were placed in this encounter.   All questions were answered. The patient knows to call the clinic  with any problems, questions or concerns.  I have spent a total of {CHL ONC TIME VISIT - [id number] minutes of face-to-face and non-face-to-face time, preparing to see the patient, obtaining and/or reviewing separately obtained history, performing a medically appropriate examination, counseling and educating the patient, ordering medications/tests/procedures, referring and communicating with other health care professionals, documenting clinical information in the electronic health record, independently interpreting results and communicating results to the patient, and care coordination.   Dede Query, PA-C Department of Hematology/Oncology [place] at [facility] Phone: [phone]

## 2022-12-22 ENCOUNTER — Encounter: Payer: Self-pay | Admitting: Physician Assistant

## 2022-12-22 ENCOUNTER — Inpatient Hospital Stay: Payer: Federal, State, Local not specified - PPO | Attending: Physician Assistant | Admitting: Physician Assistant

## 2022-12-22 ENCOUNTER — Inpatient Hospital Stay: Payer: Federal, State, Local not specified - PPO

## 2022-12-22 VITALS — BP 123/89 | HR 82 | Temp 97.9°F | Resp 18 | Wt 121.7 lb

## 2022-12-22 DIAGNOSIS — R7989 Other specified abnormal findings of blood chemistry: Secondary | ICD-10-CM | POA: Diagnosis not present

## 2022-12-22 DIAGNOSIS — Z862 Personal history of diseases of the blood and blood-forming organs and certain disorders involving the immune mechanism: Secondary | ICD-10-CM | POA: Insufficient documentation

## 2022-12-22 DIAGNOSIS — Z79899 Other long term (current) drug therapy: Secondary | ICD-10-CM | POA: Insufficient documentation

## 2022-12-22 DIAGNOSIS — Z86718 Personal history of other venous thrombosis and embolism: Secondary | ICD-10-CM | POA: Diagnosis not present

## 2022-12-22 DIAGNOSIS — F909 Attention-deficit hyperactivity disorder, unspecified type: Secondary | ICD-10-CM | POA: Insufficient documentation

## 2022-12-22 LAB — CBC WITH DIFFERENTIAL (CANCER CENTER ONLY)
Abs Immature Granulocytes: 0.02 10*3/uL (ref 0.00–0.07)
Basophils Absolute: 0.1 10*3/uL (ref 0.0–0.1)
Basophils Relative: 2 %
Eosinophils Absolute: 0.3 10*3/uL (ref 0.0–0.5)
Eosinophils Relative: 6 %
HCT: 38.5 % (ref 36.0–46.0)
Hemoglobin: 12.3 g/dL (ref 12.0–15.0)
Immature Granulocytes: 0 %
Lymphocytes Relative: 32 %
Lymphs Abs: 1.7 10*3/uL (ref 0.7–4.0)
MCH: 29.4 pg (ref 26.0–34.0)
MCHC: 31.9 g/dL (ref 30.0–36.0)
MCV: 91.9 fL (ref 80.0–100.0)
Monocytes Absolute: 0.5 10*3/uL (ref 0.1–1.0)
Monocytes Relative: 10 %
Neutro Abs: 2.7 10*3/uL (ref 1.7–7.7)
Neutrophils Relative %: 50 %
Platelet Count: 381 10*3/uL (ref 150–400)
RBC: 4.19 MIL/uL (ref 3.87–5.11)
RDW: 14.9 % (ref 11.5–15.5)
WBC Count: 5.3 10*3/uL (ref 4.0–10.5)
nRBC: 0 % (ref 0.0–0.2)

## 2022-12-22 LAB — CMP (CANCER CENTER ONLY)
ALT: 18 U/L (ref 0–44)
AST: 18 U/L (ref 15–41)
Albumin: 4.2 g/dL (ref 3.5–5.0)
Alkaline Phosphatase: 40 U/L (ref 38–126)
Anion gap: 4 — ABNORMAL LOW (ref 5–15)
BUN: 8 mg/dL (ref 6–20)
CO2: 29 mmol/L (ref 22–32)
Calcium: 9.4 mg/dL (ref 8.9–10.3)
Chloride: 108 mmol/L (ref 98–111)
Creatinine: 0.59 mg/dL (ref 0.44–1.00)
GFR, Estimated: >60 mL/min (ref >60)
Glucose, Bld: 80 mg/dL (ref 70–99)
Potassium: 3.8 mmol/L (ref 3.5–5.1)
Sodium: 141 mmol/L (ref 135–145)
Total Bilirubin: 0.4 mg/dL (ref 0.3–1.2)
Total Protein: 7.2 g/dL (ref 6.5–8.1)

## 2022-12-22 LAB — IRON AND IRON BINDING CAPACITY (CC-WL,HP ONLY)
Iron: 64 ug/dL (ref 28–170)
Saturation Ratios: 27 % (ref 10.4–31.8)
TIBC: 241 ug/dL — ABNORMAL LOW (ref 250–450)
UIBC: 177 ug/dL (ref 148–442)

## 2022-12-22 LAB — SEDIMENTATION RATE: Sed Rate: 2 mm/hr (ref 0–22)

## 2022-12-22 LAB — C-REACTIVE PROTEIN: CRP: <0.5 mg/dL (ref <1.0)

## 2022-12-22 LAB — FERRITIN: Ferritin: 594 ng/mL — ABNORMAL HIGH (ref 11–307)

## 2022-12-24 ENCOUNTER — Encounter: Payer: Self-pay | Admitting: Hematology & Oncology

## 2022-12-24 ENCOUNTER — Encounter: Payer: Self-pay | Admitting: Physician Assistant

## 2022-12-24 ENCOUNTER — Telehealth: Payer: Self-pay | Admitting: Physician Assistant

## 2022-12-24 DIAGNOSIS — R7989 Other specified abnormal findings of blood chemistry: Secondary | ICD-10-CM

## 2022-12-24 NOTE — Telephone Encounter (Signed)
I called [name] to review the lab results from 12/21/2021. Findings show improvement of ferritin levels from 1111 to 594.  Recommend to continue to hold PO iron therapy. Inflammatory markers are normal.   We will obtain abdominal US to evaluate for liver disease.   We will call back once abdominal US results are reviewed.   Patient expressed understanding of the plan provided.

## 2022-12-30 ENCOUNTER — Encounter: Payer: Self-pay | Admitting: Physician Assistant

## 2023-01-04 ENCOUNTER — Encounter: Payer: Self-pay | Admitting: *Deleted

## 2023-01-05 ENCOUNTER — Ambulatory Visit (HOSPITAL_COMMUNITY)
Admission: RE | Admit: 2023-01-05 | Discharge: 2023-01-05 | Disposition: A | Discharge status: Home / Self Care | Payer: Federal, State, Local not specified - PPO | Source: Ambulatory Visit | Attending: Physician Assistant | Admitting: Physician Assistant

## 2023-01-05 DIAGNOSIS — R7989 Other specified abnormal findings of blood chemistry: Secondary | ICD-10-CM | POA: Insufficient documentation

## 2023-01-05 DIAGNOSIS — K7689 Other specified diseases of liver: Secondary | ICD-10-CM | POA: Diagnosis not present

## 2023-01-06 MED ORDER — AMPHETAMINE-DEXTROAMPHET ER 20 MG PO CP24: 20.0000 mg | ORAL_CAPSULE | ORAL | 0 refills | Status: DC

## 2023-01-07 ENCOUNTER — Encounter: Payer: Self-pay | Admitting: Physician Assistant

## 2023-01-07 ENCOUNTER — Other Ambulatory Visit (HOSPITAL_COMMUNITY): Payer: Self-pay | Admitting: Physician Assistant

## 2023-01-07 ENCOUNTER — Telehealth: Payer: Self-pay

## 2023-01-07 DIAGNOSIS — R7989 Other specified abnormal findings of blood chemistry: Secondary | ICD-10-CM

## 2023-01-07 NOTE — Telephone Encounter (Signed)
Pt advised of [place] results and recommendations.  Lab only appt is scheduled for Wednesday 01/13/23 at 8:00 am

## 2023-01-07 NOTE — Telephone Encounter (Signed)
-----   Message from Briant Cedar, PA-C sent at 01/07/2023 10:14 AM EDT ----- Please notify patient that [place] didn't show any evidence of liver disease. We recommend patient return for hemochromatosis DNA testing.   Please schedule a lab only appt next week.   Thanks, Karena Addison  ----- Message ----- From: Jaci Standard, MD Sent: 01/06/2023   7:38 AM EDT To: Briant Cedar, PA-C  OK to order DNA testing

## 2023-01-13 ENCOUNTER — Inpatient Hospital Stay: Payer: Federal, State, Local not specified - PPO

## 2023-01-13 ENCOUNTER — Other Ambulatory Visit: Payer: Self-pay | Admitting: Physician Assistant

## 2023-01-13 DIAGNOSIS — F909 Attention-deficit hyperactivity disorder, unspecified type: Secondary | ICD-10-CM | POA: Diagnosis not present

## 2023-01-13 DIAGNOSIS — Z862 Personal history of diseases of the blood and blood-forming organs and certain disorders involving the immune mechanism: Secondary | ICD-10-CM | POA: Diagnosis not present

## 2023-01-13 DIAGNOSIS — R7989 Other specified abnormal findings of blood chemistry: Secondary | ICD-10-CM | POA: Diagnosis not present

## 2023-01-13 DIAGNOSIS — Z79899 Other long term (current) drug therapy: Secondary | ICD-10-CM | POA: Diagnosis not present

## 2023-01-13 DIAGNOSIS — Z86718 Personal history of other venous thrombosis and embolism: Secondary | ICD-10-CM | POA: Diagnosis not present

## 2023-01-15 ENCOUNTER — Ambulatory Visit: Payer: Federal, State, Local not specified - PPO | Admitting: Physician Assistant

## 2023-02-04 LAB — HEMOCHROMATOSIS DNA-PCR(C282Y,H63D)

## 2023-02-08 ENCOUNTER — Ambulatory Visit: Payer: Federal, State, Local not specified - PPO | Admitting: Physician Assistant

## 2023-02-08 ENCOUNTER — Encounter: Payer: Self-pay | Admitting: Physician Assistant

## 2023-02-08 VITALS — BP 128/88 | HR 98 | Ht 64.0 in | Wt 122.0 lb

## 2023-02-08 DIAGNOSIS — D508 Other iron deficiency anemias: Secondary | ICD-10-CM

## 2023-02-08 DIAGNOSIS — E538 Deficiency of other specified B group vitamins: Secondary | ICD-10-CM | POA: Diagnosis not present

## 2023-02-08 DIAGNOSIS — E559 Vitamin D deficiency, unspecified: Secondary | ICD-10-CM | POA: Diagnosis not present

## 2023-02-08 DIAGNOSIS — F909 Attention-deficit hyperactivity disorder, unspecified type: Secondary | ICD-10-CM | POA: Diagnosis not present

## 2023-02-08 LAB — CBC WITH DIFFERENTIAL/PLATELET
Basophils Absolute: 83 cells/uL (ref 0–200)
Hemoglobin: 13.2 g/dL (ref 11.7–15.5)
Lymphs Abs: 1991 cells/uL (ref 850–3900)
MCH: 28.8 pg (ref 27.0–33.0)
Monocytes Relative: 10.6 %
WBC: 5.5 10*3/uL (ref 3.8–10.8)

## 2023-02-08 MED ORDER — AMPHETAMINE-DEXTROAMPHET ER 30 MG PO CP24
30.0000 mg | ORAL_CAPSULE | ORAL | 0 refills | Status: DC
Start: 2023-02-08 — End: 2023-10-20
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
## 2023-02-09 ENCOUNTER — Telehealth: Payer: Self-pay | Admitting: Physician Assistant

## 2023-02-09 DIAGNOSIS — R7989 Other specified abnormal findings of blood chemistry: Secondary | ICD-10-CM

## 2023-02-09 LAB — CBC WITH DIFFERENTIAL/PLATELET
Absolute Monocytes: 583 cells/uL (ref 200–950)
Basophils Relative: 1.5 %
Eosinophils Absolute: 198 cells/uL (ref 15–500)
Eosinophils Relative: 3.6 %
HCT: 40.6 % (ref 35.0–45.0)
MCHC: 32.5 g/dL (ref 32.0–36.0)
MCV: 88.6 fL (ref 80.0–100.0)
MPV: 10 fL (ref 7.5–12.5)
Neutro Abs: 2646 cells/uL (ref 1500–7800)
Neutrophils Relative %: 48.1 %
Platelets: 380 10*3/uL (ref 140–400)
RBC: 4.58 10*6/uL (ref 3.80–5.10)
RDW: 11.8 % (ref 11.0–15.0)
Total Lymphocyte: 36.2 %

## 2023-02-09 LAB — B12 AND FOLATE PANEL
Folate: 17.2 ng/mL
Vitamin B-12: 219 pg/mL (ref 200–1100)

## 2023-02-09 LAB — VITAMIN D 25 HYDROXY (VIT D DEFICIENCY, FRACTURES): Vit D, 25-Hydroxy: 58 ng/mL (ref 30–100)

## 2023-02-09 LAB — IRON,TIBC AND FERRITIN PANEL
%SAT: 18 % (calc) (ref 16–45)
Ferritin: 547 ng/mL — ABNORMAL HIGH (ref 16–232)
Iron: 37 ug/dL — ABNORMAL LOW (ref 45–160)
TIBC: 211 mcg/dL (calc) — ABNORMAL LOW (ref 250–450)

## 2023-02-09 NOTE — Telephone Encounter (Signed)
I called Ms. Jayana Smith-Thompson to review the lab results from 01/13/2023. Hemochromatosis testing was negative for HFE mutation. Repeat labs from 02/08/2023 showed improving ferritin levels at 547. Recommend to monitor and hold iron supplementation at this time. Okay to incorporate iron rich foods into diet.   Patient will follow up in our clinic in 3 months for lab visit and 6 months for labs/follow up.   Patient expressed understanding of the plan provided.

## 2023-02-10 ENCOUNTER — Encounter: Payer: Self-pay | Admitting: Physician Assistant

## 2023-02-10 NOTE — Progress Notes (Signed)
Serum iron went from 64 to 37 in one month.  Ferritin stayed the same Hemoglobin is good B12 is very low, are you taking anything OTC?  Vitamin D looks good.

## 2023-02-12 ENCOUNTER — Telehealth: Payer: Self-pay | Admitting: Hematology and Oncology

## 2023-02-16 ENCOUNTER — Ambulatory Visit (INDEPENDENT_AMBULATORY_CARE_PROVIDER_SITE_OTHER): Payer: Federal, State, Local not specified - PPO | Admitting: Family Medicine

## 2023-02-16 VITALS — BP 121/75 | HR 84

## 2023-02-16 DIAGNOSIS — E538 Deficiency of other specified B group vitamins: Secondary | ICD-10-CM

## 2023-02-16 MED ORDER — CYANOCOBALAMIN 1000 MCG/ML IJ SOLN
1000.0000 ug | Freq: Once | INTRAMUSCULAR | Status: AC
Start: 2023-02-16 — End: 2023-02-16
  Administered 2023-02-16: 1000 ug via INTRAMUSCULAR

## 2023-02-16 NOTE — Progress Notes (Signed)
Agree with documentation as above.   Lori-Ann Lindfors, MD  

## 2023-02-16 NOTE — Progress Notes (Signed)
   Established Patient Office Visit  Subjective   Patient ID: April Vega, female    DOB: 12/16/1970  Age: 52 y.o. MRN: [id number]  Chief Complaint  Patient presents with   Pernicious Anemia    HPI  April Vega is here for a vitamin B 12 injection. Denies muscle cramps, weakness or irregular heart rate.   ROS    Objective:     BP 121/75   Pulse 84   SpO2 100%    Physical Exam   No results found for any visits on 02/16/23.    The 10-year ASCVD risk score (Arnett DK, et al., 2019) is: 1.8%    Assessment & Plan:  B12 injection - Patient tolerated injection well without complications. Patient advised to schedule next injection 30 days from today.    Problem List Items Addressed This Visit       Unprioritized   B12 deficiency - Primary    Return in about 1 month (around 03/19/2023) for B12 injection. Earna Coder, Janalyn Harder, CMA

## 2023-02-18 ENCOUNTER — Encounter: Payer: Self-pay | Admitting: Physician Assistant

## 2023-03-04 DIAGNOSIS — D485 Neoplasm of uncertain behavior of skin: Secondary | ICD-10-CM | POA: Diagnosis not present

## 2023-03-05 ENCOUNTER — Other Ambulatory Visit: Payer: Self-pay | Admitting: *Deleted

## 2023-03-05 DIAGNOSIS — K080 Exfoliation of teeth due to systemic causes: Secondary | ICD-10-CM | POA: Diagnosis not present

## 2023-03-05 DIAGNOSIS — I871 Compression of vein: Secondary | ICD-10-CM

## 2023-03-11 ENCOUNTER — Encounter: Payer: Self-pay | Admitting: Physician Assistant

## 2023-03-17 ENCOUNTER — Ambulatory Visit (HOSPITAL_COMMUNITY)
Admission: RE | Admit: 2023-03-17 | Discharge: 2023-03-17 | Disposition: A | Discharge status: Home / Self Care | Payer: Federal, State, Local not specified - PPO | Source: Ambulatory Visit | Attending: Vascular Surgery | Admitting: Vascular Surgery

## 2023-03-17 ENCOUNTER — Ambulatory Visit: Payer: Federal, State, Local not specified - PPO | Admitting: Physician Assistant

## 2023-03-17 VITALS — BP 116/76 | HR 71 | Temp 98.2°F | Resp 16 | Ht 64.0 in | Wt 128.0 lb

## 2023-03-17 DIAGNOSIS — Z86718 Personal history of other venous thrombosis and embolism: Secondary | ICD-10-CM

## 2023-03-17 DIAGNOSIS — I871 Compression of vein: Secondary | ICD-10-CM
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
## 2023-03-19 ENCOUNTER — Ambulatory Visit (INDEPENDENT_AMBULATORY_CARE_PROVIDER_SITE_OTHER): Payer: Federal, State, Local not specified - PPO | Admitting: Physician Assistant

## 2023-03-19 VITALS — BP 114/71 | HR 81 | Ht 64.0 in | Wt 126.0 lb

## 2023-03-19 DIAGNOSIS — E538 Deficiency of other specified B group vitamins: Secondary | ICD-10-CM

## 2023-03-19 DIAGNOSIS — M8000XD Age-related osteoporosis with current pathological fracture, unspecified site, subsequent encounter for fracture with routine healing: Secondary | ICD-10-CM

## 2023-03-19 DIAGNOSIS — E559 Vitamin D deficiency, unspecified: Secondary | ICD-10-CM

## 2023-03-19 MED ORDER — CYANOCOBALAMIN 1000 MCG/ML IJ SOLN
1000.0000 ug | Freq: Once | INTRAMUSCULAR | Status: AC
Start: 2023-03-19 — End: 2023-03-19
  Administered 2023-03-19: 1000 ug via INTRAMUSCULAR

## 2023-03-19 MED ORDER — DENOSUMAB 60 MG/ML ~~LOC~~ SOSY
60.0000 mg | PREFILLED_SYRINGE | Freq: Once | SUBCUTANEOUS | Status: AC
Start: 2023-03-19 — End: 2023-03-19
  Administered 2023-03-19: 60 mg via SUBCUTANEOUS

## 2023-03-19 NOTE — Progress Notes (Signed)
Pt is here for Prolia & B12 injections. Labs completed in May 2024.  She is taking 1200 mg of Calcium daily and Vitamin D weekly. She will RTC in 6 months for her next Prolia injection.   B12 Location: LD Prolia Location: Right Arm  Pt tolerated both well.

## 2023-03-19 NOTE — Progress Notes (Signed)
Agree with above plan. 

## 2023-03-31 ENCOUNTER — Telehealth: Payer: Self-pay

## 2023-03-31 NOTE — Telephone Encounter (Signed)
Initiated Prior authorization [id number] 60MG /ML syringes [id number] Via: called employee fep program Case/Key:n/a Status: approved  as of 03/31/23 Reason:no pa is required , covered medical benefit through buy and bill, if pt fill prescription  pt is responsible for 20% co-pay Notified Pt via: Mychart

## 2023-04-02 ENCOUNTER — Telehealth: Payer: Self-pay

## 2023-04-02 NOTE — Telephone Encounter (Signed)
Spoke with Gwyneth Sprout from billing, although a pre certification is not required for this pt for a Prolia injection she states  [employer] wants the physician to write a letter of medical necessity.

## 2023-04-03 ENCOUNTER — Other Ambulatory Visit: Payer: Self-pay

## 2023-04-03 DIAGNOSIS — I871 Compression of vein: Secondary | ICD-10-CM

## 2023-04-03 DIAGNOSIS — Z86718 Personal history of other venous thrombosis and embolism: Secondary | ICD-10-CM

## 2023-04-16 ENCOUNTER — Ambulatory Visit (INDEPENDENT_AMBULATORY_CARE_PROVIDER_SITE_OTHER): Payer: Federal, State, Local not specified - PPO | Admitting: Physician Assistant

## 2023-04-16 VITALS — BP 116/64 | HR 81 | Temp 99.0°F | Ht 64.0 in | Wt 127.0 lb

## 2023-04-16 DIAGNOSIS — E538 Deficiency of other specified B group vitamins: Secondary | ICD-10-CM

## 2023-04-16 MED ORDER — CYANOCOBALAMIN 1000 MCG/ML IJ SOLN
1000.0000 ug | Freq: Once | INTRAMUSCULAR | Status: AC
Start: 2023-04-16 — End: 2023-04-16
  Administered 2023-04-16: 1000 ug via INTRAMUSCULAR

## 2023-04-16 NOTE — Progress Notes (Signed)
Agree with above plan. 

## 2023-04-16 NOTE — Progress Notes (Signed)
Patient is here for a vitamin B 12 injection. Denies gastrointestinal problems or dizziness.   Patient tolerated injection well without complications on the right deltoid. Patient advised to schedule next injection in 30 days.

## 2023-04-21 ENCOUNTER — Encounter: Payer: Self-pay | Admitting: Physician Assistant

## 2023-04-21 DIAGNOSIS — R52 Pain, unspecified: Secondary | ICD-10-CM | POA: Diagnosis not present

## 2023-04-21 DIAGNOSIS — M84369P Stress fracture, unspecified tibia and fibula, subsequent encounter for fracture with malunion: Secondary | ICD-10-CM | POA: Diagnosis not present

## 2023-04-23 DIAGNOSIS — L700 Acne vulgaris: Secondary | ICD-10-CM | POA: Diagnosis not present

## 2023-04-23 DIAGNOSIS — L245 Irritant contact dermatitis due to other chemical products: Secondary | ICD-10-CM | POA: Diagnosis not present

## 2023-04-26 ENCOUNTER — Inpatient Hospital Stay: Payer: Federal, State, Local not specified - PPO | Attending: Hematology and Oncology

## 2023-04-26 ENCOUNTER — Encounter: Payer: Self-pay | Admitting: Physician Assistant

## 2023-04-26 ENCOUNTER — Telehealth: Payer: Self-pay | Admitting: Physician Assistant

## 2023-04-26 ENCOUNTER — Other Ambulatory Visit: Payer: Self-pay

## 2023-04-26 DIAGNOSIS — R7989 Other specified abnormal findings of blood chemistry: Secondary | ICD-10-CM | POA: Insufficient documentation

## 2023-04-26 DIAGNOSIS — M21762 Unequal limb length (acquired), left tibia: Secondary | ICD-10-CM | POA: Diagnosis not present

## 2023-04-26 DIAGNOSIS — E538 Deficiency of other specified B group vitamins: Secondary | ICD-10-CM

## 2023-04-26 DIAGNOSIS — M21751 Unequal limb length (acquired), right femur: Secondary | ICD-10-CM | POA: Diagnosis not present

## 2023-04-26 DIAGNOSIS — M79605 Pain in left leg: Secondary | ICD-10-CM | POA: Diagnosis not present

## 2023-04-26 LAB — CBC WITH DIFFERENTIAL (CANCER CENTER ONLY)
Abs Immature Granulocytes: 0.02 10*3/uL (ref 0.00–0.07)
Basophils Absolute: 0.1 10*3/uL (ref 0.0–0.1)
Basophils Relative: 2 %
Eosinophils Absolute: 0.2 10*3/uL (ref 0.0–0.5)
Eosinophils Relative: 4 %
HCT: 40.6 % (ref 36.0–46.0)
Hemoglobin: 13.3 g/dL (ref 12.0–15.0)
Immature Granulocytes: 1 %
Lymphocytes Relative: 37 %
Lymphs Abs: 1.6 10*3/uL (ref 0.7–4.0)
MCH: 28.9 pg (ref 26.0–34.0)
MCHC: 32.8 g/dL (ref 30.0–36.0)
MCV: 88.3 fL (ref 80.0–100.0)
Monocytes Absolute: 0.5 10*3/uL (ref 0.1–1.0)
Monocytes Relative: 12 %
Neutro Abs: 2 10*3/uL (ref 1.7–7.7)
Neutrophils Relative %: 44 %
Platelet Count: 414 10*3/uL — ABNORMAL HIGH (ref 150–400)
RBC: 4.6 MIL/uL (ref 3.87–5.11)
RDW: 13.6 % (ref 11.5–15.5)
WBC Count: 4.3 10*3/uL (ref 4.0–10.5)
nRBC: 0 % (ref 0.0–0.2)

## 2023-04-26 LAB — CMP (CANCER CENTER ONLY)
ALT: 16 U/L (ref 0–44)
AST: 18 U/L (ref 15–41)
Albumin: 4.2 g/dL (ref 3.5–5.0)
Alkaline Phosphatase: 42 U/L (ref 38–126)
Anion gap: 5 (ref 5–15)
BUN: 10 mg/dL (ref 6–20)
CO2: 29 mmol/L (ref 22–32)
Calcium: 9 mg/dL (ref 8.9–10.3)
Chloride: 107 mmol/L (ref 98–111)
Creatinine: 0.7 mg/dL (ref 0.44–1.00)
GFR, Estimated: >60 mL/min (ref >60)
Glucose, Bld: 83 mg/dL (ref 70–99)
Potassium: 3.9 mmol/L (ref 3.5–5.1)
Sodium: 141 mmol/L (ref 135–145)
Total Bilirubin: 0.4 mg/dL (ref 0.3–1.2)
Total Protein: 7.2 g/dL (ref 6.5–8.1)

## 2023-04-26 LAB — IRON AND IRON BINDING CAPACITY (CC-WL,HP ONLY)
Iron: 71 ug/dL (ref 28–170)
Saturation Ratios: 34 % — ABNORMAL HIGH (ref 10.4–31.8)
TIBC: 210 ug/dL — ABNORMAL LOW (ref 250–450)
UIBC: 139 ug/dL — ABNORMAL LOW (ref 148–442)

## 2023-04-26 LAB — SEDIMENTATION RATE: Sed Rate: 8 mm/hr (ref 0–22)

## 2023-04-26 LAB — FERRITIN: Ferritin: 630 ng/mL — ABNORMAL HIGH (ref 11–307)

## 2023-04-26 LAB — C-REACTIVE PROTEIN: CRP: 0.6 mg/dL (ref <1.0)

## 2023-04-26 NOTE — Telephone Encounter (Signed)
Patient is requesting B12 labs before next injection. Please order.

## 2023-04-27 NOTE — Telephone Encounter (Signed)
Lab order was placed.  Yes recommend that she have drawn a couple days before her next injection if at all possible.

## 2023-04-28 NOTE — Telephone Encounter (Signed)
K to add on B12 if we still can and if not then okay to order a B12 before her next appointment.

## 2023-04-29 DIAGNOSIS — M84369P Stress fracture, unspecified tibia and fibula, subsequent encounter for fracture with malunion: Secondary | ICD-10-CM | POA: Diagnosis not present

## 2023-04-30 DIAGNOSIS — E538 Deficiency of other specified B group vitamins: Secondary | ICD-10-CM | POA: Diagnosis not present

## 2023-05-03 NOTE — Progress Notes (Signed)
B12 looks great!!!  In fact we could probably space your injections a little bit.  How often are you getting them?  Is it monthly?  If yes, then we could probably go to every 6 weeks.

## 2023-05-14 ENCOUNTER — Ambulatory Visit: Payer: Federal, State, Local not specified - PPO

## 2023-05-18 ENCOUNTER — Encounter: Payer: Self-pay | Admitting: Physician Assistant

## 2023-05-19 NOTE — Telephone Encounter (Signed)
Can we look at this? I know I wrote a letter to company for approval?

## 2023-05-28 DIAGNOSIS — M84362P Stress fracture, left tibia, subsequent encounter for fracture with malunion: Secondary | ICD-10-CM | POA: Diagnosis not present

## 2023-06-07 DIAGNOSIS — M25552 Pain in left hip: Secondary | ICD-10-CM | POA: Diagnosis not present

## 2023-06-07 DIAGNOSIS — Z91040 Latex allergy status: Secondary | ICD-10-CM | POA: Diagnosis not present

## 2023-06-07 DIAGNOSIS — Z86718 Personal history of other venous thrombosis and embolism: Secondary | ICD-10-CM | POA: Diagnosis not present

## 2023-06-07 DIAGNOSIS — Z888 Allergy status to other drugs, medicaments and biological substances status: Secondary | ICD-10-CM | POA: Diagnosis not present

## 2023-06-07 DIAGNOSIS — M25551 Pain in right hip: Secondary | ICD-10-CM | POA: Diagnosis not present

## 2023-06-07 DIAGNOSIS — Z7982 Long term (current) use of aspirin: Secondary | ICD-10-CM | POA: Diagnosis not present

## 2023-06-07 DIAGNOSIS — M7061 Trochanteric bursitis, right hip: Secondary | ICD-10-CM | POA: Diagnosis not present

## 2023-06-08 DIAGNOSIS — M79605 Pain in left leg: Secondary | ICD-10-CM | POA: Diagnosis not present

## 2023-06-14 DIAGNOSIS — M79605 Pain in left leg: Secondary | ICD-10-CM | POA: Diagnosis not present

## 2023-06-18 ENCOUNTER — Ambulatory Visit (INDEPENDENT_AMBULATORY_CARE_PROVIDER_SITE_OTHER): Payer: Federal, State, Local not specified - PPO | Admitting: Family Medicine

## 2023-06-18 DIAGNOSIS — E538 Deficiency of other specified B group vitamins: Secondary | ICD-10-CM

## 2023-06-18 DIAGNOSIS — X58XXXD Exposure to other specified factors, subsequent encounter: Secondary | ICD-10-CM | POA: Diagnosis not present

## 2023-06-18 DIAGNOSIS — M84369P Stress fracture, unspecified tibia and fibula, subsequent encounter for fracture with malunion: Secondary | ICD-10-CM | POA: Diagnosis not present

## 2023-06-18 MED ORDER — CYANOCOBALAMIN 1000 MCG/ML IJ SOLN
1000.0000 ug | Freq: Once | INTRAMUSCULAR | Status: AC
Start: 2023-06-18 — End: 2023-06-18
  Administered 2023-06-18: 1000 ug via INTRAMUSCULAR

## 2023-06-18 NOTE — Progress Notes (Signed)
Patient returns today to get B12 injection, patient denies irregular HR and GI problems. Patient given injection Rt deltoid and tolerated well and will return in 6 weeks for next injection.

## 2023-06-22 NOTE — Addendum Note (Signed)
Addended by: Carren Rang A on: 06/22/2023 02:24 PM   Modules accepted: Level of Service

## 2023-06-23 NOTE — Progress Notes (Signed)
Needs cosign.

## 2023-07-01 DIAGNOSIS — M2170 Unequal limb length (acquired), unspecified site: Secondary | ICD-10-CM | POA: Diagnosis not present

## 2023-07-02 DIAGNOSIS — M84369P Stress fracture, unspecified tibia and fibula, subsequent encounter for fracture with malunion: Secondary | ICD-10-CM | POA: Diagnosis not present

## 2023-07-12 DIAGNOSIS — K5900 Constipation, unspecified: Secondary | ICD-10-CM | POA: Diagnosis not present

## 2023-07-29 ENCOUNTER — Other Ambulatory Visit: Payer: Self-pay | Admitting: Physician Assistant

## 2023-08-02 ENCOUNTER — Ambulatory Visit (INDEPENDENT_AMBULATORY_CARE_PROVIDER_SITE_OTHER): Payer: Federal, State, Local not specified - PPO

## 2023-08-02 DIAGNOSIS — M84369P Stress fracture, unspecified tibia and fibula, subsequent encounter for fracture with malunion: Secondary | ICD-10-CM | POA: Diagnosis not present

## 2023-08-02 DIAGNOSIS — E538 Deficiency of other specified B group vitamins: Secondary | ICD-10-CM

## 2023-08-02 DIAGNOSIS — Z1231 Encounter for screening mammogram for malignant neoplasm of breast: Secondary | ICD-10-CM | POA: Diagnosis not present

## 2023-08-02 DIAGNOSIS — R92333 Mammographic heterogeneous density, bilateral breasts: Secondary | ICD-10-CM | POA: Diagnosis not present

## 2023-08-02 LAB — HM MAMMOGRAPHY

## 2023-08-02 MED ORDER — CYANOCOBALAMIN 1000 MCG/ML IJ SOLN
1000.0000 ug | Freq: Once | INTRAMUSCULAR | Status: AC
Start: 2023-08-02 — End: 2023-08-02
  Administered 2023-08-02: 1000 ug via INTRAMUSCULAR

## 2023-08-02 NOTE — Progress Notes (Signed)
   Established Patient Office Visit  Subjective   Patient ID: April Vega, female    DOB: 12/21/1970  Age: 52 y.o. MRN: [id number]  Chief Complaint  Patient presents with   Pernicious Anemia    HPI  April Vega is here for a vitamin B 12 injection. Denies muscle cramps, weakness or irregular heart rate.   ROS    Objective:     There were no vitals taken for this visit.   Physical Exam   No results found for any visits on 08/02/23.    The 10-year ASCVD risk score (Arnett DK, et al., 2019) is: 1.7%    Assessment & Plan:  B12 injection - Patient tolerated injection well without complications. Patient advised to schedule next injection 6 weeks from today.    Problem List Items Addressed This Visit       Unprioritized   B12 deficiency - Primary    Return in about 6 weeks (around 09/13/2023) for B12 injection.Earna Coder, Janalyn Harder, CMA

## 2023-08-03 ENCOUNTER — Encounter: Payer: Self-pay | Admitting: Physician Assistant

## 2023-08-09 DIAGNOSIS — M21962 Unspecified acquired deformity of left lower leg: Secondary | ICD-10-CM | POA: Diagnosis not present

## 2023-08-09 DIAGNOSIS — M84369P Stress fracture, unspecified tibia and fibula, subsequent encounter for fracture with malunion: Secondary | ICD-10-CM | POA: Diagnosis not present

## 2023-08-15 ENCOUNTER — Encounter: Payer: Self-pay | Admitting: Emergency Medicine

## 2023-08-15 ENCOUNTER — Ambulatory Visit
Admission: EM | Admit: 2023-08-15 | Discharge: 2023-08-15 | Disposition: A | Discharge status: Home / Self Care | Payer: Federal, State, Local not specified - PPO | Attending: Family Medicine | Admitting: Family Medicine

## 2023-08-15 ENCOUNTER — Ambulatory Visit (INDEPENDENT_AMBULATORY_CARE_PROVIDER_SITE_OTHER): Payer: Federal, State, Local not specified - PPO

## 2023-08-15 DIAGNOSIS — K5900 Constipation, unspecified: Secondary | ICD-10-CM

## 2023-08-15 DIAGNOSIS — R1110 Vomiting, unspecified: Secondary | ICD-10-CM | POA: Diagnosis not present

## 2023-08-15 DIAGNOSIS — R197 Diarrhea, unspecified: Secondary | ICD-10-CM

## 2023-08-15 DIAGNOSIS — R1084 Generalized abdominal pain: Secondary | ICD-10-CM | POA: Diagnosis not present

## 2023-08-15 DIAGNOSIS — R140 Abdominal distension (gaseous): Secondary | ICD-10-CM | POA: Diagnosis not present

## 2023-08-15 NOTE — ED Provider Notes (Signed)
[facility]    CSN: [id number] Arrival date & time: 08/15/23  1426      History   Chief Complaint Chief Complaint  Patient presents with   Food Poisioning x 3 days    HPI April Vega is a 52 y.o. female.   HPI States that she had a normal lunch Thursday.  About 6 hours later developed abdominal crampy pain and vomited forcefully 1 time.  Since then she has no appetite.  She is trying to drink enough fluids.  She has been able to eat very much.  She had several episodes of watery diarrhea.  She continues to have crampy abdominal pain.  She has the sensation that she needs to have a bowel movement but is unable.  She does not usually suffer from constipation  Past Medical History:  Diagnosis Date   ADHD (attention deficit hyperactivity disorder)    Anemia    Dry eyes due to decreased tear production    DVT (deep venous thrombosis) (HCC)    treated with Xarelto for 2 years, now on ASA. 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   Menometrorrhagia 01/02/2016    Patient Active Problem List   Diagnosis Date Noted   Osteoporosis 08/12/2022   Osteopenia of left ankle 07/13/2022   Left ankle swelling 07/10/2022   Trigger point of neck 03/06/2022   Neck pain 03/06/2022   Fluctuation of weight 01/01/2021   Thyroid disorder screen 01/01/2021   Cold intolerance 11/22/2020   Lipoma of right upper extremity 11/22/2020   Corn of foot 11/22/2020   Bitter taste 04/28/2020   Gastritis without bleeding 04/28/2020   RLS (restless legs syndrome) 08/11/2019   Chondromalacia, knee, 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   Left Axillary lymphadenopathy 10/10/2018   Acquired leg length discrepancy 09/19/2018   No energy 05/03/2018   Irregular bleeding 05/03/2018   Overweight (BMI 25.0-29.9) 04/11/2018   Abnormal weight gain 04/11/2018   Fibroids 01/17/2018   Abnormal mammogram of left breast 12/21/2017   Depressed mood 05/10/2017   Acute deep vein thrombosis (DVT) of  femoral vein of left lower extremity (HCC) 05/06/2017   Menorrhagia with regular cycle    DVT (deep venous thrombosis) (HCC) 05/05/2017   Acute deep vein thrombosis (DVT) of popliteal vein of left lower extremity (HCC)    Neuropathy, leg    Arthralgia of lower leg    Tachycardia    Bipolar 1 disorder, depressed (HCC) 12/24/2016   Depression 04/10/2016   DUB (dysfunctional uterine bleeding) 04/02/2016   Absolute anemia 02/20/2016   Other specified behavioral and emotional disorders with onset usually occurring in childhood and adolescence 02/20/2016   Excessive and frequent menstruation with irregular cycle 01/02/2016   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   B12 deficiency 07/30/2015   GERD (gastroesophageal reflux disease) 12/22/2014   Environmental allergies 12/17/2014   Eosinophils increased 10/15/2014   IDA (iron deficiency anemia) 09/10/2014   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   GAD (generalized anxiety disorder) 09/23/2012   Adult ADHD 03/16/2012   Vitamin D deficiency 01/17/2012   Cannot sleep 11/04/2011   Generalized headache 11/04/2011    Past Surgical History:  Procedure Laterality Date   bone graph  01/1990   CESAREAN SECTION     CORONARY ULTRASOUND/IVUS Left 05/12/2017   Procedure: Intravascular Ultrasound/IVUS;  Surgeon: Maeola Harman, MD;  Location: [facility] INVASIVE CV LAB;  Service: Cardiovascular;  Laterality: Left;  IVC TO LT POPLITEAL VEIN   CORONARY ULTRASOUND/IVUS N/A  03/07/2018   Procedure: INTRAVASCULAR ULTRASOUND/IVUS;  Surgeon: Maeola Harman, MD;  Location: [facility] INVASIVE CV LAB;  Service: Cardiovascular;  Laterality: N/A;   ENDOMETRIAL ABLATION W/ NOVASURE  2019   LOWER EXTREMITY VENOGRAPHY Bilateral 05/11/2017   Procedure: Bilateral Lower Extremity Venography;  Surgeon: Nada Libman, MD;  Location: [facility] INVASIVE CV LAB;  Service: Cardiovascular;  Laterality: Bilateral;   LOWER EXTREMITY VENOGRAPHY  N/A 03/07/2018   Procedure: LOWER EXTREMITY VENOGRAPHY;  Surgeon: Maeola Harman, MD;  Location: [facility] INVASIVE CV LAB;  Service: Cardiovascular;  Laterality: N/A;   PERIPHERAL VASCULAR BALLOON ANGIOPLASTY Left 03/07/2018   Procedure: PERIPHERAL VASCULAR BALLOON ANGIOPLASTY;  Surgeon: Maeola Harman, MD;  Location: [facility] INVASIVE CV LAB;  Service: Cardiovascular;  Laterality: Left;  left common iliac vein   PERIPHERAL VASCULAR INTERVENTION Left 05/12/2017   Procedure: PERIPHERAL VASCULAR INTERVENTION;  Surgeon: Maeola Harman, MD;  Location: [facility] INVASIVE CV LAB;  Service: Cardiovascular;  Laterality: Left;  IVC TO LT COMMON FEM VEIN  STENT   PERIPHERAL VASCULAR INTERVENTION Right 03/07/2018   Procedure: PERIPHERAL VASCULAR INTERVENTION;  Surgeon: Maeola Harman, MD;  Location: [facility] INVASIVE CV LAB;  Service: Cardiovascular;  Laterality: Right;  right common iliac vein   PERIPHERAL VASCULAR THROMBECTOMY Left 05/11/2017   Procedure: PERIPHERAL VASCULAR THROMBECTOMY;  Surgeon: Nada Libman, MD;  Location: [facility] INVASIVE CV LAB;  Service: Cardiovascular;  Laterality: Left;  left lower extremity venous    OB History     Gravida  3   Para  2   Term  2   Preterm      AB  1   Living  2      SAB  1   IAB      Ectopic      Multiple      Live Births               Home Medications    Prior to Admission medications   Medication Sig Start Date End Date Taking? Authorizing Provider  aspirin 81 MG tablet Take by mouth.   Yes [provider]  RESTASIS 0.05 % ophthalmic emulsion  10/01/20  Yes [provider]  Vitamin D, Ergocalciferol, (DRISDOL) 1.25 MG (50000 UNIT) CAPS capsule TAKE 1 CAPSULE BY MOUTH ONE TIME PER WEEK 07/30/23  Yes [name], Jade L, PA-C  amphetamine-dextroamphetamine (ADDERALL XR) 30 MG 24 hr capsule Take 1 capsule (30 mg total) by mouth every morning. 02/08/23   Jomarie Longs, PA-C    Family History Family  History  Adopted: Yes    Social History Social History   Tobacco Use   Smoking status: Never    Passive exposure: Never   Smokeless tobacco: Never  Vaping Use   Vaping status: Never Used  Substance Use Topics   Alcohol use: No    Alcohol/week: 0.0 standard drinks of alcohol   Drug use: No     Allergies   Wellbutrin [bupropion] and Latex   Review of Systems Review of Systems See HPI  Physical Exam Triage Vital Signs ED Triage Vitals  Encounter Vitals Group     BP 08/15/23 1439 111/75     Systolic BP Percentile --      Diastolic BP Percentile --      Pulse Rate 08/15/23 1439 84     Resp 08/15/23 1439 16     Temp 08/15/23 1439 98.6 F (37 C)     Temp Source 08/15/23 1439 Oral     SpO2  08/15/23 1439 98 %     Weight 08/15/23 1440 110 lb (49.9 kg)     Height 08/15/23 1440 5\' 4"  (1.626 m)     Head Circumference --      Peak Flow --      Pain Score 08/15/23 1440 0     Pain Loc --      Pain Education --      Exclude from Growth Chart --    No data found.  Updated Vital Signs BP 111/75 (BP Location: Right Arm)   Pulse 84   Temp 98.6 F (37 C) (Oral)   Resp 16   Ht 5\' 4"  (1.626 m)   Wt 49.9 kg   SpO2 98%   BMI 18.88 kg/m       Physical Exam Constitutional:      General: She is in acute distress.     Appearance: She is well-developed and normal weight.  HENT:     Head: Normocephalic and atraumatic.     Mouth/Throat:     Mouth: Mucous membranes are moist.  Eyes:     Conjunctiva/sclera: Conjunctivae normal.     Pupils: Pupils are equal, round, and reactive to light.  Cardiovascular:     Rate and Rhythm: Normal rate and regular rhythm.     Heart sounds: Normal heart sounds.  Pulmonary:     Effort: Pulmonary effort is normal. No respiratory distress.     Breath sounds: Normal breath sounds.  Abdominal:     General: There is no distension.     Palpations: Abdomen is soft.     Tenderness: There is abdominal tenderness.     Comments: Abdomen is  soft.  Thin abdominal me.  There is palpable fullness in the left colon.  Tenderness in the left abdomen.  Musculoskeletal:        General: Normal range of motion.     Cervical back: Normal range of motion.  Skin:    General: Skin is warm and dry.  Neurological:     Mental Status: She is alert.      UC Treatments / Results  Labs (all labs ordered are listed, but only abnormal results are displayed) Labs Reviewed - No data to display  EKG   Radiology No results found.  Procedures Procedures (including critical care time)  Medications Ordered in UC Medications - No data to display  Initial Impression / Assessment and Plan / UC Course  I have reviewed the triage vital signs and the nursing notes.  Pertinent labs & imaging results that were available during my care of the patient were reviewed by me and considered in my medical decision making (see chart for details).     Patient is discharged prior to formal x-ray reading, however, patient does have an excess of stool in her left colon consistent with constipation Final Clinical Impressions(s) / UC Diagnoses   Final diagnoses:  Constipation, unspecified constipation type  Generalized abdominal pain     Discharge Instructions      You need to drink a lot of water You need to take MiraLAX to relieve the blockage.  Take a capful with water tonight.  Repeat in the morning if not successful A stronger alternative is to drink half bottle of magnesium citrate.  This works well but may increase the cramping  Follow-up with Ms. Caleen Essex   ED Prescriptions   None    PDMP not reviewed this encounter.   Eustace Moore, MD 08/15/23 [phone]

## 2023-08-15 NOTE — Discharge Instructions (Signed)
You need to drink a lot of water You need to take MiraLAX to relieve the blockage.  Take a capful with water tonight.  Repeat in the morning if not successful A stronger alternative is to drink half bottle of magnesium citrate.  This works well but may increase the cramping  Follow-up with Ms. Caleen Essex

## 2023-08-15 NOTE — ED Triage Notes (Signed)
Patient c/o possible food poisoning, vomiting and diarrhea x 3 days.  Patient states she ate a Cashew Chicken Salad she had for lunch on Thursday.  She hasn't been able to keep anything down since.  Home COVID test was negative.  Patient has taken TUMS for her sx's.

## 2023-08-18 ENCOUNTER — Encounter: Payer: Self-pay | Admitting: Obstetrics and Gynecology

## 2023-08-18 ENCOUNTER — Ambulatory Visit: Payer: Federal, State, Local not specified - PPO | Admitting: Obstetrics and Gynecology

## 2023-08-18 ENCOUNTER — Inpatient Hospital Stay (HOSPITAL_BASED_OUTPATIENT_CLINIC_OR_DEPARTMENT_OTHER): Payer: Federal, State, Local not specified - PPO | Admitting: Hematology and Oncology

## 2023-08-18 ENCOUNTER — Inpatient Hospital Stay: Payer: Federal, State, Local not specified - PPO | Attending: Hematology and Oncology

## 2023-08-18 VITALS — BP 111/75 | HR 71 | Temp 98.2°F | Resp 17 | Ht 64.0 in | Wt 111.3 lb

## 2023-08-18 VITALS — BP 111/69 | HR 71 | Resp 16 | Ht 64.0 in | Wt 110.0 lb

## 2023-08-18 DIAGNOSIS — K5900 Constipation, unspecified: Secondary | ICD-10-CM | POA: Diagnosis not present

## 2023-08-18 DIAGNOSIS — R7989 Other specified abnormal findings of blood chemistry: Secondary | ICD-10-CM | POA: Diagnosis not present

## 2023-08-18 DIAGNOSIS — Z86718 Personal history of other venous thrombosis and embolism: Secondary | ICD-10-CM | POA: Diagnosis not present

## 2023-08-18 DIAGNOSIS — Z01419 Encounter for gynecological examination (general) (routine) without abnormal findings: Secondary | ICD-10-CM

## 2023-08-18 LAB — CBC WITH DIFFERENTIAL (CANCER CENTER ONLY)
Abs Immature Granulocytes: 0.01 K/uL (ref 0.00–0.07)
Basophils Absolute: 0.1 K/uL (ref 0.0–0.1)
Basophils Relative: 2 %
Eosinophils Absolute: 0.2 K/uL (ref 0.0–0.5)
Eosinophils Relative: 4 %
HCT: 38.5 % (ref 36.0–46.0)
Hemoglobin: 12.3 g/dL (ref 12.0–15.0)
Immature Granulocytes: 0 %
Lymphocytes Relative: 31 %
Lymphs Abs: 1.3 K/uL (ref 0.7–4.0)
MCH: 28.8 pg (ref 26.0–34.0)
MCHC: 31.9 g/dL (ref 30.0–36.0)
MCV: 90.2 fL (ref 80.0–100.0)
Monocytes Absolute: 0.5 K/uL (ref 0.1–1.0)
Monocytes Relative: 11 %
Neutro Abs: 2.2 K/uL (ref 1.7–7.7)
Neutrophils Relative %: 52 %
Platelet Count: 372 K/uL (ref 150–400)
RBC: 4.27 MIL/uL (ref 3.87–5.11)
RDW: 13.9 % (ref 11.5–15.5)
Smear Review: NORMAL
WBC Count: 4.2 K/uL (ref 4.0–10.5)
nRBC: 0 % (ref 0.0–0.2)

## 2023-08-18 LAB — IRON AND IRON BINDING CAPACITY (CC-WL,HP ONLY)
Iron: 55 ug/dL (ref 28–170)
Saturation Ratios: 30 % (ref 10.4–31.8)
TIBC: 183 ug/dL — ABNORMAL LOW (ref 250–450)
UIBC: 128 ug/dL — ABNORMAL LOW (ref 148–442)

## 2023-08-18 LAB — CMP (CANCER CENTER ONLY)
ALT: 9 U/L (ref 0–44)
AST: 13 U/L — ABNORMAL LOW (ref 15–41)
Albumin: 4.3 g/dL (ref 3.5–5.0)
Alkaline Phosphatase: 31 U/L — ABNORMAL LOW (ref 38–126)
Anion gap: 5 (ref 5–15)
BUN: 5 mg/dL — ABNORMAL LOW (ref 6–20)
CO2: 28 mmol/L (ref 22–32)
Calcium: 9.2 mg/dL (ref 8.9–10.3)
Chloride: 109 mmol/L (ref 98–111)
Creatinine: 0.63 mg/dL (ref 0.44–1.00)
GFR, Estimated: >60 mL/min (ref >60)
Glucose, Bld: 84 mg/dL (ref 70–99)
Potassium: 3.2 mmol/L — ABNORMAL LOW (ref 3.5–5.1)
Sodium: 142 mmol/L (ref 135–145)
Total Bilirubin: 0.4 mg/dL (ref <1.2)
Total Protein: 7.2 g/dL (ref 6.5–8.1)

## 2023-08-18 LAB — C-REACTIVE PROTEIN: CRP: 0.5 mg/dL (ref <1.0)

## 2023-08-18 LAB — SEDIMENTATION RATE: Sed Rate: 4 mm/h (ref 0–22)

## 2023-08-18 LAB — FERRITIN: Ferritin: 437 ng/mL — ABNORMAL HIGH (ref 11–307)

## 2023-08-18 NOTE — Progress Notes (Signed)
[facility] [phone]   [phone]  PROGRESS NOTE  Patient Care Team: Nolene Ebbs as PCP - General (Family Medicine) Monica Becton, MD as Consulting Physician (Family Medicine)  Hematological/Oncological History # Elevated Ferritin  12/22/2022: establish care with Dr. Leonides Schanz   Interval History:  April Vega 52 y.o. female with medical history significant for elevated ferritin and prior provoked VTE who presents for a follow up visit. The patient's last visit was on 12/22/2022 at which time she established care. In the interim since the last visit she has had no major changes in her health.  On exam today April Vega reports she is not currently taking any iron supplements and stopped them back in April when requested.  She notes that her energy levels have dropped considerably and her energy is currently a 4 out of 10.  She reports that over the last 6 weeks has been doing her best not to consume any sodium or sugar and is try to eat a more plant-based and protein diet.  She reports that she is not having any issues with bleeding, bruising, or dark stools.  She notes that she continues to take a baby aspirin 81 mg p.o. daily for thromboprophylaxis after her estrogen induced right lower extremity DVT.  Overall she reports that she is at her baseline level of health other than a slumping energy level.  She currently denies any fevers, chills, sweats, nausea, vomiting or diarrhea.  A full 10 point ROS is otherwise negative.  MEDICAL HISTORY:  Past Medical History:  Diagnosis Date   ADHD (attention deficit hyperactivity disorder)    Anemia    Dry eyes due to decreased tear production    DVT (deep venous thrombosis) (HCC)    treated with Xarelto for 2 years, now on ASA. 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   Menometrorrhagia 01/02/2016    SURGICAL HISTORY: Past Surgical History:  Procedure  Laterality Date   bone graph  01/1990   CESAREAN SECTION     CORONARY ULTRASOUND/IVUS Left 05/12/2017   Procedure: Intravascular Ultrasound/IVUS;  Surgeon: Maeola Harman, MD;  Location: [facility] INVASIVE CV LAB;  Service: Cardiovascular;  Laterality: Left;  IVC TO LT POPLITEAL VEIN   CORONARY ULTRASOUND/IVUS N/A 03/07/2018   Procedure: INTRAVASCULAR ULTRASOUND/IVUS;  Surgeon: Maeola Harman, MD;  Location: [facility] INVASIVE CV LAB;  Service: Cardiovascular;  Laterality: N/A;   ENDOMETRIAL ABLATION W/ NOVASURE  2019   LOWER EXTREMITY VENOGRAPHY Bilateral 05/11/2017   Procedure: Bilateral Lower Extremity Venography;  Surgeon: Nada Libman, MD;  Location: [facility] INVASIVE CV LAB;  Service: Cardiovascular;  Laterality: Bilateral;   LOWER EXTREMITY VENOGRAPHY N/A 03/07/2018   Procedure: LOWER EXTREMITY VENOGRAPHY;  Surgeon: Maeola Harman, MD;  Location: [facility] INVASIVE CV LAB;  Service: Cardiovascular;  Laterality: N/A;   PERIPHERAL VASCULAR BALLOON ANGIOPLASTY Left 03/07/2018   Procedure: PERIPHERAL VASCULAR BALLOON ANGIOPLASTY;  Surgeon: Maeola Harman, MD;  Location: [facility] INVASIVE CV LAB;  Service: Cardiovascular;  Laterality: Left;  left common iliac vein   PERIPHERAL VASCULAR INTERVENTION Left 05/12/2017   Procedure: PERIPHERAL VASCULAR INTERVENTION;  Surgeon: Maeola Harman, MD;  Location: [facility] INVASIVE CV LAB;  Service: Cardiovascular;  Laterality: Left;  IVC TO LT COMMON FEM VEIN  STENT   PERIPHERAL VASCULAR INTERVENTION Right 03/07/2018   Procedure: PERIPHERAL VASCULAR INTERVENTION;  Surgeon: Maeola Harman, MD;  Location: [facility] INVASIVE CV LAB;  Service: Cardiovascular;  Laterality: Right;  right common iliac  vein   PERIPHERAL VASCULAR THROMBECTOMY Left 05/11/2017   Procedure: PERIPHERAL VASCULAR THROMBECTOMY;  Surgeon: Nada Libman, MD;  Location: [facility] INVASIVE CV LAB;  Service: Cardiovascular;  Laterality: Left;  left lower extremity venous    SOCIAL  HISTORY: Social History   Socioeconomic History   Marital status: Married    Spouse name: Not on file   Number of children: Not on file   Years of education: Not on file   Highest education level: Master's degree (e.g., MA, MS, MEng, MEd, MSW, MBA)  Occupational History   Occupation: Runner, broadcasting/film/video  Tobacco Use   Smoking status: Never    Passive exposure: Never   Smokeless tobacco: Never  Vaping Use   Vaping status: Never Used  Substance and Sexual Activity   Alcohol use: No    Alcohol/week: 0.0 standard drinks of alcohol   Drug use: No   Sexual activity: Yes    Partners: Male    Birth control/protection: None    Comment: Occ use condoms  Other Topics Concern   Not on file  Social History Narrative   Not on file   Social Determinants of Health   Financial Resource Strain: Low Risk  (04/20/2023)   Received from [facility]   Overall Financial Resource Strain (CARDIA)    Difficulty of Paying Living Expenses: Not very hard  Food Insecurity: No Food Insecurity (04/20/2023)   [facility]   Hunger Vital Sign    Worried About Running Out of Food in the Last Year: Never true    Ran Out of Food in the Last Year: Never true  Transportation Needs: No Transportation Needs (04/20/2023)   Received from [facility] - Transportation    Lack of Transportation (Medical): No    Lack of Transportation (Non-Medical): No  Physical Activity: Insufficiently Active (04/20/2023)   Received from [facility]   Exercise Vital Sign    Days of Exercise per Week: 3 days    Minutes of Exercise per Session: 40 min  Stress: No Stress Concern Present (04/20/2023)   Received from [facility] - Occupational Stress Questionnaire    Feeling of Stress : Not at all  Social Connections: Socially Integrated (04/20/2023)   Received from [facility]   Social Network    How would you rate your social network (family, work, friends)?: Good  participation with social networks  Intimate Partner Violence: Not At Risk (06/07/2023)   Received from [facility]   HITS    Over the last 12 months how often did your partner physically hurt you?: Never    Over the last 12 months how often did your partner insult you or talk down to you?: Never    Over the last 12 months how often did your partner threaten you with physical harm?: Never    Over the last 12 months how often did your partner scream or curse at you?: Never    FAMILY HISTORY: Family History  Adopted: Yes    ALLERGIES:  is allergic to wellbutrin [bupropion] and latex.  MEDICATIONS:  Current Outpatient Medications  Medication Sig Dispense Refill   amphetamine-dextroamphetamine (ADDERALL XR) 30 MG 24 hr capsule Take 1 capsule (30 mg total) by mouth every morning. 30 capsule 0   aspirin 81 MG tablet Take by mouth.     RESTASIS 0.05 % ophthalmic emulsion      Vitamin D, Ergocalciferol, (DRISDOL) 1.25 MG (50000 UNIT) CAPS capsule TAKE  1 CAPSULE BY MOUTH ONE TIME PER WEEK 12 capsule 3   No current facility-administered medications for this visit.    REVIEW OF SYSTEMS:   Constitutional: ( - ) fevers, ( - )  chills , ( - ) night sweats Eyes: ( - ) blurriness of vision, ( - ) double vision, ( - ) watery eyes Ears, nose, mouth, throat, and face: ( - ) mucositis, ( - ) sore throat Respiratory: ( - ) cough, ( - ) dyspnea, ( - ) wheezes Cardiovascular: ( - ) palpitation, ( - ) chest discomfort, ( - ) lower extremity swelling Gastrointestinal:  ( - ) nausea, ( - ) heartburn, ( - ) change in bowel habits Skin: ( - ) abnormal skin rashes Lymphatics: ( - ) new lymphadenopathy, ( - ) easy bruising Neurological: ( - ) numbness, ( - ) tingling, ( - ) new weaknesses Behavioral/Psych: ( - ) mood change, ( - ) new changes  All other systems were reviewed with the patient and are negative.  PHYSICAL EXAMINATION:  There were no vitals filed for this visit. There were no vitals filed  for this visit.  GENERAL: Well-appearing middle-age African-American female, alert, no distress and comfortable SKIN: skin color, texture, turgor are normal, no rashes or significant lesions EYES: conjunctiva are pink and non-injected, sclera clear LUNGS: clear to auscultation and percussion with normal breathing effort HEART: regular rate & rhythm and no murmurs and no lower extremity edema Musculoskeletal: no cyanosis of digits and no clubbing  PSYCH: alert & oriented x 3, fluent speech NEURO: no focal motor/sensory deficits  LABORATORY DATA:  I have reviewed the data as listed    Latest Ref Rng & Units 04/26/2023    7:33 AM 02/08/2023    4:29 PM 12/22/2022   11:12 AM  CBC  WBC 4.0 - 10.5 K/uL 4.3  5.5  5.3   Hemoglobin 12.0 - 15.0 g/dL 40.1  02.7  25.3   Hematocrit 36.0 - 46.0 % 40.6  40.6  38.5   Platelets 150 - 400 K/uL 414  380  381        Latest Ref Rng & Units 04/26/2023    7:33 AM 12/22/2022   11:12 AM 09/16/2022    2:53 PM  CMP  Glucose 70 - 99 mg/dL 83  80  78   BUN 6 - 20 mg/dL 10  8  9    Creatinine 0.44 - 1.00 mg/dL 6.64  4.03  4.74   Sodium 135 - 145 mmol/L 141  141  141   Potassium 3.5 - 5.1 mmol/L 3.9  3.8  4.0   Chloride 98 - 111 mmol/L 107  108  106   CO2 22 - 32 mmol/L 29  29  28    Calcium 8.9 - 10.3 mg/dL 9.0  9.4  9.7   Total Protein 6.5 - 8.1 g/dL 7.2  7.2  7.3   Total Bilirubin 0.3 - 1.2 mg/dL 0.4  0.4  0.4   Alkaline Phos 38 - 126 U/L 42  40    AST 15 - 41 U/L 18  18  13    ALT 0 - 44 U/L 16  18  15      RADIOGRAPHIC STUDIES: DG Abdomen 1 View  Result Date: 08/15/2023 CLINICAL DATA:  Vomiting and diarrhea. EXAM: ABDOMEN - 1 VIEW COMPARISON:  None Available. FINDINGS: Sub fine abdomen shows no overt gaseous bowel dilatation to suggest obstruction. There is a paucity of bowel gas over the pelvis. Iliac  vascular stents noted bilaterally. Fixation hardware proximal right femur has been incompletely visualized. IMPRESSION: Nonobstructive bowel gas pattern.  Electronically Signed   By: Kennith Center M.D.   On: 08/15/2023 16:18    ASSESSMENT & PLAN April Vega 52 y.o. female with medical history significant for elevated ferritin and prior provoked VTE who presents for a follow up visit.   # Elevated Iron/Ferritin --likely multifactorial, secondary to iron overload from chronic PO iron therapy and chronic inflammation. Discontinued in February 2024 after last lab results showing ferritin level of 1111 ng/mL.  --labs to include CBC, CMP, ESR, CRP, iron panel and ferritin today --no suspicion for hereditary hemachromatosis given negative genetic testing, also prior labs showed normal serum iron and saturation levels and low TIBC levels.  --labs today show white blood cell 4.2, hemoglobin 12.3, MCV 90.2, platelets 372.  Iron studies currently pending. --RTC in 3 months for labs and 6 months for clinic visit.     # Provoked VTE -- prior LE DVT in setting of estrogen use -- not currently on anticoagulation.   No orders of the defined types were placed in this encounter.   All questions were answered. The patient knows to call the clinic with any problems, questions or concerns.  A total of more than 30 minutes were spent on this encounter with face-to-face time and non-face-to-face time, including preparing to see the patient, ordering tests and/or medications, counseling the patient and coordination of care as outlined above.   Ulysees Barns, MD Department of Hematology/Oncology [facility] Phone: [phone] Pager: [phone] Email: [email or web address]  08/18/2023 7:20 AM

## 2023-08-18 NOTE — Progress Notes (Signed)
ANNUAL EXAM Patient name: April Vega MRN [id number]  Date of birth: [birth year] Chief Complaint:   Annual Exam  History of Present Illness:   April Vega is a 52 y.o. menopausal [phone] with No LMP recorded. Patient has had an ablation. being seen today for a routine annual exam.  Current complaints: Recent ED visit for abdominal pain - dx with severe constipation. Last BM >3 days ago  Last pap NILM/neg 12/22/21. H/O abnormal pap: no Last mammogram: 08/02/23. Results were: BI-RADS2.  Last colonoscopy: 2022. Results were: normal     12/22/2022    9:22 AM 09/09/2022    3:30 PM 07/10/2022    2:47 PM 03/06/2022    3:58 PM 01/01/2021    2:03 PM  Depression screen PHQ 2/9  Decreased Interest 0 0 0 0 0  Down, Depressed, Hopeless 0 0 0 0 0  PHQ - 2 Score 0 0 0 0 0        08/02/2019    9:57 AM 05/02/2018    3:41 PM 04/11/2018    4:08 PM 01/04/2018   11:27 AM  GAD 7 : Generalized Anxiety Score  Nervous, Anxious, on Edge 0 1 0 2  Control/stop worrying 0 1 2 1   Worry too much - different things 3 1 1 1   Trouble relaxing 3 1 2 2   Restless 2 1 1  0  Easily annoyed or irritable 2 0 1 0  Afraid - awful might happen 0 0 0 0  Total GAD 7 Score 10 5 7 6   Anxiety Difficulty Not difficult at all Somewhat difficult Somewhat difficult Not difficult at all     Review of Systems:   Pertinent items are noted in HPI Denies any headaches, blurred vision, fatigue, shortness of breath, chest pain, abdominal pain, abnormal vaginal discharge/itching/odor/irritation, problems with periods, bowel movements, urination, or intercourse unless otherwise stated above. Pertinent History Reviewed:  Reviewed past medical,surgical, social and family history.  Reviewed problem list, medications and allergies. Physical Assessment:   Vitals:   08/18/23 1020  BP: 111/69  Pulse: 71  Resp: 16  Weight: 110 lb (49.9 kg)  Height: 5\' 4"  (1.626 m)  Body mass index is 18.88 kg/m.         Physical Examination:   General appearance - well appearing, and in no distress  Mental status - alert, oriented to person, place, and time  Chest - respiratory effort normal  Heart - normal peripheral perfusion  Breasts - breasts appear normal, no suspicious masses, no skin or nipple changes or axillary nodes  Abdomen - soft, nontender, nondistended. Mobile mass noted in LLQ - suspected to be stool burden given pt's recent ED visit.  Pelvic - VULVA: normal appearing vulva with no masses, tenderness or lesions  VAGINA: normal appearing vagina with normal color and discharge, no lesions  CERVIX: normal texture, no CMT  UTERUS: uterus is felt to be normal size, shape, consistency and nontender   ADNEXA: No adnexal masses or tenderness noted.  Chaperone present for exam  Results for orders placed or performed in visit on 08/18/23 (from the past 24 hour(s))  Sedimentation rate   Collection Time: 08/18/23  7:45 AM  Result Value Ref Range   Sed Rate 4 0 - 22 mm/hr  Iron and Iron Binding Capacity ([facility] only)   Collection Time: 08/18/23  7:45 AM  Result Value Ref Range   Iron 55 28 - 170 ug/dL   TIBC 578 (L) 469 - 629 ug/dL  Saturation Ratios 30 10.4 - 31.8 %   UIBC 128 (L) 148 - 442 ug/dL  CMP (Cancer Center only)   Collection Time: 08/18/23  7:45 AM  Result Value Ref Range   Sodium 142 135 - 145 mmol/L   Potassium 3.2 (L) 3.5 - 5.1 mmol/L   Chloride 109 98 - 111 mmol/L   CO2 28 22 - 32 mmol/L   Glucose, Bld 84 70 - 99 mg/dL   BUN 5 (L) 6 - 20 mg/dL   Creatinine 1.61 0.96 - 1.00 mg/dL   Calcium 9.2 8.9 - 04.5 mg/dL   Total Protein 7.2 6.5 - 8.1 g/dL   Albumin 4.3 3.5 - 5.0 g/dL   AST 13 (L) 15 - 41 U/L   ALT 9 0 - 44 U/L   Alkaline Phosphatase 31 (L) 38 - 126 U/L   Total Bilirubin 0.4 <1.2 mg/dL   GFR, Estimated >40 >98 mL/min   Anion gap 5 5 - 15  CBC with Differential (Can[facility] Center Only)   Collection Time: 08/18/23  7:45 AM  Result Value Ref Range   WBC Count 4.2 4.0  - 10.5 K/uL   RBC 4.27 3.87 - 5.11 MIL/uL   Hemoglobin 12.3 12.0 - 15.0 g/dL   HCT 11.9 14.7 - 82.9 %   MCV 90.2 80.0 - 100.0 fL   MCH 28.8 26.0 - 34.0 pg   MCHC 31.9 30.0 - 36.0 g/dL   RDW 56.2 13.0 - 86.5 %   Platelet Count 372 150 - 400 K/uL   nRBC 0.0 0.0 - 0.2 %   Neutrophils Relative % 52 %   Neutro Abs 2.2 1.7 - 7.7 K/uL   Lymphocytes Relative 31 %   Lymphs Abs 1.3 0.7 - 4.0 K/uL   Monocytes Relative 11 %   Monocytes Absolute 0.5 0.1 - 1.0 K/uL   Eosinophils Relative 4 %   Eosinophils Absolute 0.2 0.0 - 0.5 K/uL   Basophils Relative 2 %   Basophils Absolute 0.1 0.0 - 0.1 K/uL   WBC Morphology MORPHOLOGY UNREMARKABLE    RBC Morphology MORPHOLOGY UNREMARKABLE    Smear Review Normal platelet morphology    Immature Granulocytes 0 %   Abs Immature Granulocytes 0.01 0.00 - 0.07 K/uL    Assessment & Plan:  1) Well-Woman Exam Mammogram: in 1 year, or sooner if problems Colonoscopy: per GI, or sooner if problems Pap: Next due 2028  2) Constipation Stool felt in LLQ on abdominal exam. Mobile, non tender Normal abdominal XR 08/15/23 Has follow up with PCP & GI scheduled, discussed importance of follow up.   Follow-up: Return in about 1 year (around 08/17/2024) for annual exam or sooner as needed.  Lennart Pall, MD 08/18/2023 12:09 PM

## 2023-09-07 DIAGNOSIS — M79605 Pain in left leg: Secondary | ICD-10-CM | POA: Diagnosis not present

## 2023-09-10 DIAGNOSIS — K5900 Constipation, unspecified: Secondary | ICD-10-CM | POA: Diagnosis not present

## 2023-09-13 ENCOUNTER — Ambulatory Visit: Payer: Federal, State, Local not specified - PPO

## 2023-09-13 ENCOUNTER — Telehealth: Payer: Self-pay

## 2023-09-13 ENCOUNTER — Ambulatory Visit (INDEPENDENT_AMBULATORY_CARE_PROVIDER_SITE_OTHER): Payer: Federal, State, Local not specified - PPO

## 2023-09-13 DIAGNOSIS — E538 Deficiency of other specified B group vitamins: Secondary | ICD-10-CM | POA: Diagnosis not present

## 2023-09-13 MED ORDER — CYANOCOBALAMIN 1000 MCG/ML IJ SOLN
1000.0000 ug | Freq: Once | INTRAMUSCULAR | Status: AC
  Administered 2023-09-13: 1000 ug via INTRAMUSCULAR

## 2023-09-13 NOTE — Progress Notes (Signed)
   Established Patient Office Visit  Subjective   Patient ID: April Vega, female    DOB: 08/19/1971  Age: 52 y.o. MRN: [id number]  Chief Complaint  Patient presents with   Pernicious Anemia    HPI  April Vega is here for a vitamin B 12 injection. Denies muscle cramps, weakness or irregular heart rate.   ROS    Objective:     There were no vitals taken for this visit.   Physical Exam   No results found for any visits on 09/13/23.    The 10-year ASCVD risk score (Arnett DK, et al., 2019) is: 1.5%    Assessment & Plan:  B12 injection - Patient tolerated injection well without complications. Patient advised to schedule next injection 6 weeks from today.    Problem List Items Addressed This Visit       Unprioritized   B12 deficiency - Primary    Return in about 6 weeks (around 10/25/2023) for B12 injection. Earna Coder, Janalyn Harder, CMA

## 2023-09-13 NOTE — Telephone Encounter (Signed)
Need prior authorization for Prolia.

## 2023-09-16 ENCOUNTER — Ambulatory Visit: Payer: Federal, State, Local not specified - PPO | Admitting: Obstetrics and Gynecology

## 2023-09-16 ENCOUNTER — Ambulatory Visit: Payer: Federal, State, Local not specified - PPO

## 2023-09-17 ENCOUNTER — Ambulatory Visit: Payer: Federal, State, Local not specified - PPO

## 2023-09-17 ENCOUNTER — Ambulatory Visit: Payer: Federal, State, Local not specified - PPO | Admitting: Physician Assistant

## 2023-09-20 NOTE — Telephone Encounter (Signed)
Copied from CRM [id number]. Topic: Clinical - Medication Question >> Sep 13, 2023  3:52 PM Monisha R wrote: Reason for CRM: Patient calling in to give number for prior authorization! For upcoming visit Phone number is  [phone] select option [phone]

## 2023-09-21 ENCOUNTER — Ambulatory Visit: Payer: Federal, State, Local not specified - PPO | Admitting: Physician Assistant

## 2023-09-21 VITALS — BP 103/63 | HR 72 | Resp 12 | Ht 64.0 in | Wt 116.9 lb

## 2023-09-21 DIAGNOSIS — K5909 Other constipation: Secondary | ICD-10-CM

## 2023-09-21 DIAGNOSIS — K909 Intestinal malabsorption, unspecified: Secondary | ICD-10-CM

## 2023-09-21 DIAGNOSIS — D508 Other iron deficiency anemias: Secondary | ICD-10-CM

## 2023-09-21 DIAGNOSIS — M8000XD Age-related osteoporosis with current pathological fracture, unspecified site, subsequent encounter for fracture with routine healing: Secondary | ICD-10-CM

## 2023-09-21 DIAGNOSIS — N3281 Overactive bladder: Secondary | ICD-10-CM | POA: Diagnosis not present

## 2023-09-21 DIAGNOSIS — E876 Hypokalemia: Secondary | ICD-10-CM | POA: Diagnosis not present

## 2023-09-21 DIAGNOSIS — R7989 Other specified abnormal findings of blood chemistry: Secondary | ICD-10-CM

## 2023-09-21 DIAGNOSIS — R3121 Asymptomatic microscopic hematuria: Secondary | ICD-10-CM

## 2023-09-21 DIAGNOSIS — F909 Attention-deficit hyperactivity disorder, unspecified type: Secondary | ICD-10-CM

## 2023-09-21 LAB — POCT URINALYSIS DIP (CLINITEK)
Bilirubin, UA: NEGATIVE
Glucose, UA: NEGATIVE mg/dL
Ketones, POC UA: NEGATIVE mg/dL
Nitrite, UA: NEGATIVE
POC PROTEIN,UA: NEGATIVE
Spec Grav, UA: 1.02 (ref 1.010–1.025)
Urobilinogen, UA: 0.2 U/dL
pH, UA: 7 (ref 5.0–8.0)

## 2023-09-21 LAB — POCT UA - MICROALBUMIN
Creatinine, POC: 50 mg/dL
Microalbumin Ur, POC: 30 mg/L

## 2023-09-21 MED ORDER — SOLIFENACIN SUCCINATE 5 MG PO TABS: 5.0000 mg | ORAL_TABLET | Freq: Every day | ORAL | 1 refills | Status: DC

## 2023-09-21 MED ORDER — LISDEXAMFETAMINE DIMESYLATE 40 MG PO CAPS: 40.0000 mg | ORAL_CAPSULE | ORAL | 0 refills | Status: DC

## 2023-09-21 MED ORDER — DENOSUMAB 60 MG/ML ~~LOC~~ SOSY
60.0000 mg | PREFILLED_SYRINGE | Freq: Once | SUBCUTANEOUS | Status: AC
  Administered 2023-09-21: 60 mg via SUBCUTANEOUS

## 2023-09-21 MED ORDER — DENOSUMAB 60 MG/ML ~~LOC~~ SOSY: 60.0000 mg | PREFILLED_SYRINGE | Freq: Once | SUBCUTANEOUS | 0 refills | Status: DC

## 2023-09-21 MED ORDER — LUBIPROSTONE 24 MCG PO CAPS: 24.0000 ug | ORAL_CAPSULE | Freq: Two times a day (BID) | ORAL | 0 refills | Status: DC

## 2023-09-21 NOTE — Progress Notes (Signed)
 Established Patient Office Visit  Subjective   Patient ID: April Vega, female    DOB: [birth year]  Age: 52 y.o. MRN: [id number]  Chief Complaint  Patient presents with   Cystitis   iron level    Follow up    HPI Pt is a 52 yo female with elevated ferritin, IDA, osteoporosis, ADHD who presents to the clinic for follow up.   Pt is doing well. She continues to battle constipation from iron supplements. Miralax is not helping. She needs her prolia . She needs refills of her vyvanse . She would like to recheck her ferritin levels. She has not had any infusions. She was told to stop iron supplements.   She is having lots of overactive bladder symptoms. No pain. She is leaking some. She would like something to help.    Patient Active Problem List   Diagnosis Date Noted   Asymptomatic microscopic hematuria 09/21/2023   Hypokalemia 09/21/2023   Chronic constipation 09/21/2023   Elevated ferritin 09/21/2023   Osteoporosis 08/12/2022   Osteopenia of left ankle 07/13/2022   Left ankle swelling 07/10/2022   Trigger point of neck 03/06/2022   Neck pain 03/06/2022   Fluctuation of weight 01/01/2021   Thyroid  disorder screen 01/01/2021   Cold intolerance 11/22/2020   Lipoma of right upper extremity 11/22/2020   Corn of foot 11/22/2020   Bitter taste 04/28/2020   Gastritis without bleeding 04/28/2020   RLS (restless legs syndrome) 08/11/2019   Chondromalacia, knee, 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   Left Axillary lymphadenopathy 10/10/2018   Acquired leg length discrepancy 09/19/2018   No energy 05/03/2018   Irregular bleeding 05/03/2018   Overweight (BMI 25.0-29.9) 04/11/2018   Abnormal weight gain 04/11/2018   Fibroids 01/17/2018   Abnormal mammogram of left breast 12/21/2017   Depressed mood 05/10/2017   Acute deep vein thrombosis (DVT) of femoral vein of left lower extremity (HCC) 05/06/2017   Menorrhagia with regular cycle    DVT (deep venous thrombosis) (HCC) 05/05/2017   Acute  deep vein thrombosis (DVT) of popliteal vein of left lower extremity (HCC)    Neuropathy, leg    Arthralgia of lower leg    Tachycardia    Bipolar 1 disorder, depressed (HCC) 12/24/2016   Depression 04/10/2016   DUB (dysfunctional uterine bleeding) 04/02/2016   Absolute anemia 02/20/2016   Other specified behavioral and emotional disorders with onset usually occurring in childhood and adolescence 02/20/2016   Excessive and frequent menstruation with irregular cycle 01/02/2016   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   B12 deficiency 07/30/2015   GERD (gastroesophageal reflux disease) 12/22/2014   Environmental allergies 12/17/2014   Eosinophils increased 10/15/2014   IDA (iron deficiency anemia) 09/10/2014   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   Menometrorrhagia 01/02/2016   Past Surgical History:  Procedure Laterality Date   bone graph  01/1990   CESAREAN SECTION     CORONARY ULTRASOUND/IVUS Left 05/12/2017   Procedure: Intravascular Ultrasound/IVUS;  Surgeon: Sheree Penne Bruckner, MD;  Location:  [facility];  Service: Cardiovascular;  Laterality: Left;  IVC TO LT POPLITEAL VEIN   CORONARY ULTRASOUND/IVUS N/A 03/07/2018   Procedure: INTRAVASCULAR ULTRASOUND/IVUS;  Surgeon: Sheree Penne Bruckner, MD;  Location: [facility] INVASIVE CV LAB;  Service: Cardiovascular;  Laterality: N/A;   ENDOMETRIAL ABLATION W/ NOVASURE  2019   LOWER EXTREMITY VENOGRAPHY Bilateral 05/11/2017   Procedure:  Bilateral Lower Extremity Venography;  Surgeon: Serene Gaile ORN, MD;  Location: [facility];  Service: Cardiovascular;  Laterality: Bilateral;   LOWER EXTREMITY VENOGRAPHY N/A 03/07/2018   Procedure: LOWER EXTREMITY VENOGRAPHY;  Surgeon: Sheree Penne Bruckner, MD;  Location: [facility] INVASIVE CV LAB;  Service: Cardiovascular;  Laterality: N/A;   PERIPHERAL VASCULAR BALLOON ANGIOPLASTY Left 03/07/2018   Procedure: PERIPHERAL VASCULAR BALLOON ANGIOPLASTY;  Surgeon: Sheree Penne Bruckner, MD;  Location: [facility] INVASIVE CV LAB;  Service: Cardiovascular;  Laterality: Left;  left common iliac vein   PERIPHERAL VASCULAR INTERVENTION Left 05/12/2017   Procedure: PERIPHERAL VASCULAR INTERVENTION;  Surgeon: Sheree Penne Bruckner, MD;  Location: [facility] INVASIVE CV LAB;  Service: Cardiovascular;  Laterality: Left;  IVC TO LT COMMON FEM VEIN  STENT   PERIPHERAL VASCULAR INTERVENTION Right 03/07/2018   Procedure: PERIPHERAL VASCULAR INTERVENTION;  Surgeon: Sheree Penne Bruckner, MD;  Location: [facility] INVASIVE CV LAB;  Service: Cardiovascular;  Laterality: Right;  right common iliac vein   PERIPHERAL VASCULAR THROMBECTOMY Left 05/11/2017   Procedure: PERIPHERAL VASCULAR THROMBECTOMY;  Surgeon: Serene Gaile ORN, MD;  Location: [facility];  Service: Cardiovascular;  Laterality: Left;  left lower extremity venous   Family History  Adopted: Yes   Allergies  Allergen Reactions   Wellbutrin  Cyriaca.[email or web address] ] Other (See Comments)    Temporary memory loss   Latex Rash and Swelling      ROS See HPI.    Objective:     BP 103/63 (BP Location: Right Arm, Patient Position: Sitting)   Pulse 72   Resp 12   Ht 5' 4 (1.626 m)   Wt 116 lb 14.4 oz (53 kg)   SpO2 100%   BMI 20.07 kg/m  BP Readings from Last 3 Encounters:  09/21/23 103/63  08/18/23 111/69  08/18/23 111/75   Wt Readings from Last 3 Encounters:  09/21/23 116 lb 14.4 oz (53 kg)  08/18/23 110 lb (49.9 kg)  08/18/23 111 lb 4.8 oz (50.5 kg)       Physical Exam Constitutional:      Appearance: Normal appearance.  HENT:     Head: Normocephalic.  Cardiovascular:     Rate and Rhythm: Normal rate and regular rhythm.  Neurological:     General: No focal deficit present.     Mental Status: She is alert and oriented to person, place, and time.  Psychiatric:        Mood and Affect: Mood normal.      Results for orders placed or performed in visit on 09/21/23  Urine Culture   Specimen: Urine   UR  Result Value Ref Range   Urine Culture, Routine Final report    Organism ID, Bacteria Comment   Iron and TIBC([facility])  Result Value Ref Range   Total Iron Binding Capacity 218 (L) 250 - 450 ug/dL   UIBC 837 868 - 574 ug/dL   Iron 56 27 - 840 ug/dL   Iron Saturation 26 15 - 55 %  Ferritin  Result Value Ref Range   Ferritin 761 (H) 15 - 150 ng/mL  Potassium  Result Value Ref Range   Potassium  4.4 3.5 - 5.2 mmol/L  POCT URINALYSIS DIP (CLINITEK)  Result Value Ref Range   Color, UA yellow (A) yellow   Clarity, UA clear clear   Glucose, UA negative negative mg/dL   Bilirubin, UA negative negative   Ketones, POC UA negative negative mg/dL   Spec Grav, UA 8.979 8.989 - 1.025   Blood, UA trace-lysed (A) negative   pH, UA 7.0 5.0 - 8.0   POC PROTEIN,UA negative negative, trace   Urobilinogen, UA 0.2 0.2 or 1.0 E.U./dL   Nitrite, UA Negative Negative   Leukocytes, UA Small (1+) (A) Negative  POCT UA - Microalbumin  Result Value Ref Range   Microalbumin Ur, POC 30 mg/L   Creatinine, POC 50 mg/dL   Albumin/Creatinine Ratio, Urine, POC 30-300       The 10-year ASCVD risk score (Arnett DK, et al., 2019) is: 1%    Assessment & Plan:  SABRASABRAJaymie was seen today for cystitis and iron level.  Diagnoses and all orders for this visit:  Osteoporosis with current pathological fracture with routine healing, unspecified osteoporosis type, subsequent encounter -     Discontinue: denosumab  (PROLIA ) 60 MG/ML SOSY  injection; Inject 60 mg into the skin once for 1 dose. -     denosumab  (PROLIA ) injection 60 mg  OAB (overactive bladder) -     POCT URINALYSIS DIP (CLINITEK) -     Urine Culture -     solifenacin  (VESICARE ) 5 MG tablet; Take 1 tablet (5 mg total) by mouth daily. -     POCT UA - Microalbumin  Iron deficiency anemia secondary to inadequate dietary iron intake -     Iron and TIBC([facility]) -     Ferritin  Elevated ferritin -     Iron and TIBC([facility]) -     Ferritin  Iron malabsorption -     Iron and TIBC([facility]) -     Ferritin  Hypokalemia -     Potassium  Asymptomatic microscopic hematuria  Chronic constipation -     lubiprostone  (AMITIZA ) 24 MCG capsule; Take 1 capsule (24 mcg total) by mouth 2 (two) times daily with a meal.  Adult ADHD -     lisdexamfetamine (VYVANSE ) 40 MG capsule; Take 1 capsule (40 mg total) by mouth every morning.    Reviewed previous labs from hematology Reordered today Recheck potassium due to low on last check  Chronic constipation likely drug induced Trial of amitiza   Prolia  given for osteoporosis Discussed other prevention Follow up in 6 months Bone density UTD  Vyvanse  refilled  OAB symptoms- elevated scoring on questionnaire Discussed kegals Start vesicare , discussed SE's  UA had some hematuria Will culture Will recheck for resolution or work up of hematuria Follow up in 4 weeks   Return in about 4 weeks (around 10/19/2023) for Follow up.    Clevland Cork, PA-C

## 2023-09-21 NOTE — Telephone Encounter (Addendum)
 Initiated Prior authorization for:denosumab  (PROLIA ) injection 60 mg  Via: [facility] bill Case/Key:n/a, no auth number avaliable  pa approval letter has been scanned into pt chart under media Status: approved  as of 09/20/24 Reason:this shara has been approved till 09/20/24 Notified Pt via: Mychart

## 2023-09-21 NOTE — Patient Instructions (Signed)

## 2023-09-21 NOTE — Telephone Encounter (Signed)
 The Prolia does not require a prior authorization for a buy and bill.   Call reference number [id number]

## 2023-09-22 LAB — IRON AND TIBC
Iron Saturation: 26 % (ref 15–55)
Iron: 56 ug/dL (ref 27–159)
Total Iron Binding Capacity: 218 ug/dL — ABNORMAL LOW (ref 250–450)
UIBC: 162 ug/dL (ref 131–425)

## 2023-09-22 LAB — FERRITIN: Ferritin: 761 ng/mL — ABNORMAL HIGH (ref 15–150)

## 2023-09-22 LAB — POTASSIUM: Potassium: 4.4 mmol/L (ref 3.5–5.2)

## 2023-09-23 ENCOUNTER — Encounter: Payer: Self-pay | Admitting: Physician Assistant

## 2023-09-23 LAB — URINE CULTURE

## 2023-09-23 NOTE — Progress Notes (Signed)
 No significant bacteria in urine.   Iron stores went way up. Follow up with hematology.   Serum iron and iron saturation is good.

## 2023-09-24 ENCOUNTER — Encounter: Payer: Self-pay | Admitting: *Deleted

## 2023-09-24 ENCOUNTER — Telehealth: Payer: Self-pay | Admitting: *Deleted

## 2023-09-24 NOTE — Telephone Encounter (Addendum)
 [name] responded via MyChart.  Copied from CRM [id number]. Topic: Clinical - Lab/Test Results >> Sep 23, 2023 12:12 PM [name] wrote: Reason for CRM: Patient requesting a call back about lab results, as she has questions. Patient states she never got a message and would like to know where it was sent. [phone] is the number of the patient

## 2023-09-24 NOTE — Telephone Encounter (Signed)
 See MyChart message

## 2023-09-24 NOTE — Telephone Encounter (Signed)
 I called and cancelled the prescription of Prolia. It was sent to the pharmacy by mistake.

## 2023-09-24 NOTE — Telephone Encounter (Signed)
 Forwarded to [place] since we usually order the Prolia .  Copied from CRM [id number]. Topic: Clinical - Prescription Issue >> Sep 23, 2023 12:31 PM [name] wrote: Reason for CRM: Pharmacy calling to state that Prolia  prescription is missing instructions and that they need it resent to specify dosage, method of use, and for a duration/frequency. Fax at [phone].

## 2023-09-28 ENCOUNTER — Encounter: Payer: Self-pay | Admitting: Physician Assistant

## 2023-10-05 ENCOUNTER — Other Ambulatory Visit: Payer: Self-pay | Admitting: Physician Assistant

## 2023-10-05 DIAGNOSIS — K5909 Other constipation: Secondary | ICD-10-CM

## 2023-10-13 ENCOUNTER — Other Ambulatory Visit: Payer: Self-pay | Admitting: Physician Assistant

## 2023-10-13 DIAGNOSIS — N3281 Overactive bladder: Secondary | ICD-10-CM

## 2023-10-13 DIAGNOSIS — K5909 Other constipation: Secondary | ICD-10-CM

## 2023-10-19 ENCOUNTER — Ambulatory Visit: Payer: Federal, State, Local not specified - PPO | Admitting: Physician Assistant

## 2023-10-20 ENCOUNTER — Ambulatory Visit: Payer: Federal, State, Local not specified - PPO | Admitting: Physician Assistant

## 2023-10-20 ENCOUNTER — Encounter: Payer: Self-pay | Admitting: Physician Assistant

## 2023-10-20 VITALS — BP 110/67 | HR 73 | Ht 62.0 in | Wt 114.5 lb

## 2023-10-20 DIAGNOSIS — N3281 Overactive bladder: Secondary | ICD-10-CM | POA: Diagnosis not present

## 2023-10-20 DIAGNOSIS — R350 Frequency of micturition: Secondary | ICD-10-CM

## 2023-10-20 DIAGNOSIS — R7989 Other specified abnormal findings of blood chemistry: Secondary | ICD-10-CM

## 2023-10-20 DIAGNOSIS — K909 Intestinal malabsorption, unspecified: Secondary | ICD-10-CM | POA: Diagnosis not present

## 2023-10-20 DIAGNOSIS — K5909 Other constipation: Secondary | ICD-10-CM

## 2023-10-20 DIAGNOSIS — D508 Other iron deficiency anemias: Secondary | ICD-10-CM

## 2023-10-20 DIAGNOSIS — F909 Attention-deficit hyperactivity disorder, unspecified type: Secondary | ICD-10-CM

## 2023-10-20 DIAGNOSIS — E538 Deficiency of other specified B group vitamins: Secondary | ICD-10-CM

## 2023-10-20 DIAGNOSIS — E876 Hypokalemia: Secondary | ICD-10-CM

## 2023-10-20 LAB — POCT URINALYSIS DIP (CLINITEK)
Bilirubin, UA: NEGATIVE
Blood, UA: NEGATIVE
Glucose, UA: NEGATIVE mg/dL
Ketones, POC UA: NEGATIVE mg/dL
Leukocytes, UA: NEGATIVE
Nitrite, UA: NEGATIVE
POC PROTEIN,UA: NEGATIVE
Spec Grav, UA: 1.01 (ref 1.010–1.025)
Urobilinogen, UA: 0.2 U/dL
pH, UA: 8 (ref 5.0–8.0)

## 2023-10-20 MED ORDER — LISDEXAMFETAMINE DIMESYLATE 40 MG PO CAPS: 40.0000 mg | ORAL_CAPSULE | ORAL | 0 refills | Status: DC

## 2023-10-20 MED ORDER — MIRABEGRON ER 50 MG PO TB24: 50.0000 mg | ORAL_TABLET | Freq: Every day | ORAL | 2 refills | Status: DC

## 2023-10-20 NOTE — Patient Instructions (Signed)
Sent myrbetriq to replace vesicare Start amitiza Continue vyvanse Get labs today

## 2023-10-20 NOTE — Progress Notes (Signed)
Established Patient Office Visit  Subjective   Patient ID: April Vega, female    DOB: Feb 15, 1971  Age: 53 y.o. MRN: [id number]  Chief Complaint  Patient presents with   overactive bladder    Follow up   Constipation    Follow up - patient states she was not able to pick up amitzia due to neeidng PA that was not completed.     HPI Pt is a 53 yo female who presents to the clinic for follow up.   She never started [place] because PA has not been done. Continues to struggle with constipation.   She did starte vesicare for OAB. Seemed to help very minimally. She would like to try something elese.   She is doing well on vyanse and would like to continue.   She is not taking iron right now and due for recheck.   Patient Active Problem List   Diagnosis Date Noted   Asymptomatic microscopic hematuria 09/21/2023   Hypokalemia 09/21/2023   Chronic constipation 09/21/2023   Elevated ferritin 09/21/2023   Osteoporosis 08/12/2022   Osteopenia of left ankle 07/13/2022   Left ankle swelling 07/10/2022   Trigger point of neck 03/06/2022   Neck pain 03/06/2022   Fluctuation of weight 01/01/2021   Thyroid disorder screen 01/01/2021   Cold intolerance 11/22/2020   Lipoma of right upper extremity 11/22/2020   Corn of foot 11/22/2020   Bitter taste 04/28/2020   Gastritis without bleeding 04/28/2020   RLS (restless legs syndrome) 08/11/2019   Chondromalacia, knee, 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   Left Axillary lymphadenopathy 10/10/2018   Acquired leg length discrepancy 09/19/2018   No energy 05/03/2018   Irregular bleeding 05/03/2018   Overweight (BMI 25.0-29.9) 04/11/2018   Abnormal weight gain 04/11/2018   Fibroids 01/17/2018   Abnormal mammogram of left breast 12/21/2017   Depressed mood 05/10/2017   Acute deep vein thrombosis (DVT) of femoral vein of left lower extremity (HCC) 05/06/2017   Menorrhagia with regular cycle    DVT (deep venous thrombosis) (HCC) 05/05/2017   Acute  deep vein thrombosis (DVT) of popliteal vein of left lower extremity (HCC)    Neuropathy, leg    Arthralgia of lower leg    Tachycardia    Bipolar 1 disorder, depressed (HCC) 12/24/2016   Depression 04/10/2016   DUB (dysfunctional uterine bleeding) 04/02/2016   Absolute anemia 02/20/2016   Other specified behavioral and emotional disorders with onset usually occurring in childhood and adolescence 02/20/2016   Excessive and frequent menstruation with irregular cycle 01/02/2016   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   B12 deficiency 07/30/2015   GERD (gastroesophageal reflux disease) 12/22/2014   Environmental allergies 12/17/2014   Eosinophils increased 10/15/2014   IDA (iron deficiency anemia) 09/10/2014   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   GAD (generalized anxiety disorder) 09/23/2012   Adult ADHD 03/16/2012   Vitamin D deficiency 01/17/2012   Cannot sleep 11/04/2011   Generalized headache 11/04/2011   Past Medical History:  Diagnosis Date   ADHD (attention deficit hyperactivity disorder)    Anemia    Dry eyes due to decreased tear production    DVT (deep venous thrombosis) (HCC)    treated with Xarelto for 2 years, now on ASA. 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   Menometrorrhagia 01/02/2016   Past Surgical History:  Procedure Laterality Date   bone graph  01/1990   CESAREAN SECTION     CORONARY ULTRASOUND/IVUS Left 05/12/2017   Procedure:  Intravascular Ultrasound/IVUS;  Surgeon: Maeola Harman, MD;  Location: [facility] INVASIVE CV LAB;  Service: Cardiovascular;  Laterality: Left;  IVC TO LT POPLITEAL VEIN   CORONARY ULTRASOUND/IVUS N/A 03/07/2018   Procedure: INTRAVASCULAR ULTRASOUND/IVUS;  Surgeon: Maeola Harman, MD;  Location: [facility] INVASIVE CV LAB;  Service: Cardiovascular;  Laterality: N/A;   ENDOMETRIAL ABLATION W/ NOVASURE  2019   LOWER EXTREMITY VENOGRAPHY Bilateral 05/11/2017   Procedure:  Bilateral Lower Extremity Venography;  Surgeon: Nada Libman, MD;  Location: [facility] INVASIVE CV LAB;  Service: Cardiovascular;  Laterality: Bilateral;   LOWER EXTREMITY VENOGRAPHY N/A 03/07/2018   Procedure: LOWER EXTREMITY VENOGRAPHY;  Surgeon: Maeola Harman, MD;  Location: [facility] INVASIVE CV LAB;  Service: Cardiovascular;  Laterality: N/A;   PERIPHERAL VASCULAR BALLOON ANGIOPLASTY Left 03/07/2018   Procedure: PERIPHERAL VASCULAR BALLOON ANGIOPLASTY;  Surgeon: Maeola Harman, MD;  Location: [facility] INVASIVE CV LAB;  Service: Cardiovascular;  Laterality: Left;  left common iliac vein   PERIPHERAL VASCULAR INTERVENTION Left 05/12/2017   Procedure: PERIPHERAL VASCULAR INTERVENTION;  Surgeon: Maeola Harman, MD;  Location: [facility] INVASIVE CV LAB;  Service: Cardiovascular;  Laterality: Left;  IVC TO LT COMMON FEM VEIN  STENT   PERIPHERAL VASCULAR INTERVENTION Right 03/07/2018   Procedure: PERIPHERAL VASCULAR INTERVENTION;  Surgeon: Maeola Harman, MD;  Location: [facility] INVASIVE CV LAB;  Service: Cardiovascular;  Laterality: Right;  right common iliac vein   PERIPHERAL VASCULAR THROMBECTOMY Left 05/11/2017   Procedure: PERIPHERAL VASCULAR THROMBECTOMY;  Surgeon: Nada Libman, MD;  Location: [facility] INVASIVE CV LAB;  Service: Cardiovascular;  Laterality: Left;  left lower extremity venous   Family History  Adopted: Yes   Allergies  Allergen Reactions   Wellbutrin [Bupropion] Other (See Comments)    Temporary memory loss   Latex Rash and Swelling      ROS See HPI.    Objective:     BP 110/67   Pulse 73   Ht 5\' 2"  (1.575 m)   Wt 114 lb 8 oz (51.9 kg)   SpO2 100%   BMI 20.94 kg/m  BP Readings from Last 3 Encounters:  10/20/23 110/67  09/21/23 103/63  08/18/23 111/69   Wt Readings from Last 3 Encounters:  10/20/23 114 lb 8 oz (51.9 kg)  09/21/23 116 lb 14.4 oz (53 kg)  08/18/23 110 lb (49.9 kg)      Physical Exam Constitutional:      Appearance: Normal  appearance.  HENT:     Head: Normocephalic.  Cardiovascular:     Rate and Rhythm: Normal rate and regular rhythm.  Pulmonary:     Effort: Pulmonary effort is normal.  Neurological:     General: No focal deficit present.     Mental Status: She is alert and oriented to person, place, and time.  Psychiatric:        Mood and Affect: Mood normal.    .. Results for orders placed or performed in visit on 10/20/23  Fe+TIBC+Fer   Collection Time: 10/20/23  8:24 AM  Result Value Ref Range   Total Iron Binding Capacity 218 (L) 250 - 450 ug/dL   UIBC 409 811 - 914 ug/dL   Iron 61 27 - 782 ug/dL   Iron Saturation 28 15 - 55 %   Ferritin 851 (H) 15 - 150 ng/mL  CBC w/Diff/Platelet   Collection Time: 10/20/23  8:24 AM  Result Value Ref Range   WBC 4.4 3.4 - 10.8 x10E3/uL   RBC 4.37 3.77 - 5.28 x10E6/uL  Hemoglobin 12.8 11.1 - 15.9 g/dL   Hematocrit 16.1 09.6 - 46.6 %   MCV 90 79 - 97 fL   MCH 29.3 26.6 - 33.0 pg   MCHC 32.6 31.5 - 35.7 g/dL   RDW 04.5 40.9 - 81.1 %   Platelets 377 150 - 450 x10E3/uL   Neutrophils 51 Not Estab. %   Lymphs 30 Not Estab. %   Monocytes 9 Not Estab. %   Eos 8 Not Estab. %   Basos 2 Not Estab. %   Neutrophils Absolute 2.3 1.4 - 7.0 x10E3/uL   Lymphocytes Absolute 1.3 0.7 - 3.1 x10E3/uL   Monocytes Absolute 0.4 0.1 - 0.9 x10E3/uL   EOS (ABSOLUTE) 0.4 0.0 - 0.4 x10E3/uL   Basophils Absolute 0.1 0.0 - 0.2 x10E3/uL   Immature Granulocytes 0 Not Estab. %   Immature Grans (Abs) 0.0 0.0 - 0.1 x10E3/uL  CMP14+EGFR   Collection Time: 10/20/23  8:24 AM  Result Value Ref Range   Glucose 79 70 - 99 mg/dL   BUN 5 (L) 6 - 24 mg/dL   Creatinine, Ser 9.14 0.57 - 1.00 mg/dL   eGFR 782 >95 AO/ZHY/8.65   BUN/Creatinine Ratio 9 9 - 23   Sodium 140 134 - 144 mmol/L   Potassium 3.9 3.5 - 5.2 mmol/L   Chloride 103 96 - 106 mmol/L   CO2 24 20 - 29 mmol/L   Calcium 9.2 8.7 - 10.2 mg/dL   Total Protein 6.8 6.0 - 8.5 g/dL   Albumin 4.4 3.8 - 4.9 g/dL   Globulin,  Total 2.4 1.5 - 4.5 g/dL   Bilirubin Total 0.4 0.0 - 1.2 mg/dL   Alkaline Phosphatase 46 44 - 121 IU/L   AST 19 0 - 40 IU/L   ALT 19 0 - 32 IU/L  B12 and Folate Panel   Collection Time: 10/20/23  8:24 AM  Result Value Ref Range   Vitamin B-12 308 232 - 1,245 pg/mL   Folate 13.6 >3.0 ng/mL  POCT URINALYSIS DIP (CLINITEK)   Collection Time: 10/20/23  3:52 PM  Result Value Ref Range   Color, UA yellow yellow   Clarity, UA clear clear   Glucose, UA negative negative mg/dL   Bilirubin, UA negative negative   Ketones, POC UA negative negative mg/dL   Spec Grav, UA 7.846 9.629 - 1.025   Blood, UA negative negative   pH, UA 8.0 5.0 - 8.0   POC PROTEIN,UA negative negative, trace   Urobilinogen, UA 0.2 0.2 or 1.0 E.U./dL   Nitrite, UA Negative Negative   Leukocytes, UA Negative Negative    The 10-year ASCVD risk score (Arnett DK, et al., 2019) is: 1.3%    Assessment & Plan:  [name] KitchenMelania was seen today for overactive bladder and constipation.  Diagnoses and all orders for this visit:  OAB (overactive bladder) -     mirabegron ER (MYRBETRIQ) 50 MG TB24 tablet; Take 1 tablet (50 mg total) by mouth daily. -     Fe+TIBC+Fer -     CBC w/Diff/Platelet -     CMP14+EGFR -     B12 and Folate Panel -     POCT URINALYSIS DIP (CLINITEK)  Chronic constipation -     Fe+TIBC+Fer -     CBC w/Diff/Platelet -     CMP14+EGFR -     B12 and Folate Panel  Iron malabsorption -     Fe+TIBC+Fer -     CBC w/Diff/Platelet  Iron deficiency anemia secondary to inadequate dietary iron  intake -     Fe+TIBC+Fer -     CBC w/Diff/Platelet  Elevated ferritin -     Fe+TIBC+Fer -     CBC w/Diff/Platelet  Hypokalemia -     CMP14+EGFR  B12 deficiency -     B12 and Folate Panel  Adult ADHD -     lisdexamfetamine (VYVANSE) 40 MG capsule; Take 1 capsule (40 mg total) by mouth every morning. -     lisdexamfetamine (VYVANSE) 40 MG capsule; Take 1 capsule (40 mg total) by mouth every morning. -      lisdexamfetamine (VYVANSE) 40 MG capsule; Take 1 capsule (40 mg total) by mouth every morning.  Urine frequency   Refilled vyvanse doing well No concerns follow up in 3 months  Not able to get amitiza yet, PA pending in office Needs to try this for constipation  Recheck hematology labs for follow up  Failed vesicare for OAB symptoms Trial of myrbetriq could also be less side effects on constipation.   Tandy Gaw, PA-C

## 2023-10-21 LAB — CBC WITH DIFFERENTIAL/PLATELET
Basophils Absolute: 0.1 10*3/uL (ref 0.0–0.2)
Basos: 2 %
EOS (ABSOLUTE): 0.4 10*3/uL (ref 0.0–0.4)
Eos: 8 %
Hematocrit: 39.3 % (ref 34.0–46.6)
Hemoglobin: 12.8 g/dL (ref 11.1–15.9)
Immature Grans (Abs): 0 10*3/uL (ref 0.0–0.1)
Immature Granulocytes: 0 %
Lymphocytes Absolute: 1.3 10*3/uL (ref 0.7–3.1)
Lymphs: 30 %
MCH: 29.3 pg (ref 26.6–33.0)
MCHC: 32.6 g/dL (ref 31.5–35.7)
MCV: 90 fL (ref 79–97)
Monocytes Absolute: 0.4 10*3/uL (ref 0.1–0.9)
Monocytes: 9 %
Neutrophils Absolute: 2.3 10*3/uL (ref 1.4–7.0)
Neutrophils: 51 %
Platelets: 377 10*3/uL (ref 150–450)
RBC: 4.37 x10E6/uL (ref 3.77–5.28)
RDW: 12.6 % (ref 11.7–15.4)
WBC: 4.4 10*3/uL (ref 3.4–10.8)

## 2023-10-21 LAB — CMP14+EGFR
ALT: 19 [IU]/L (ref 0–32)
AST: 19 [IU]/L (ref 0–40)
Albumin: 4.4 g/dL (ref 3.8–4.9)
Alkaline Phosphatase: 46 [IU]/L (ref 44–121)
BUN/Creatinine Ratio: 9 (ref 9–23)
BUN: 5 mg/dL — ABNORMAL LOW (ref 6–24)
Bilirubin Total: 0.4 mg/dL (ref 0.0–1.2)
CO2: 24 mmol/L (ref 20–29)
Calcium: 9.2 mg/dL (ref 8.7–10.2)
Chloride: 103 mmol/L (ref 96–106)
Creatinine, Ser: 0.58 mg/dL (ref 0.57–1.00)
Globulin, Total: 2.4 g/dL (ref 1.5–4.5)
Glucose: 79 mg/dL (ref 70–99)
Potassium: 3.9 mmol/L (ref 3.5–5.2)
Sodium: 140 mmol/L (ref 134–144)
Total Protein: 6.8 g/dL (ref 6.0–8.5)
eGFR: 107 mL/min/{1.73_m2} (ref >59)

## 2023-10-21 LAB — IRON,TIBC AND FERRITIN PANEL
Ferritin: 851 ng/mL — ABNORMAL HIGH (ref 15–150)
Iron Saturation: 28 % (ref 15–55)
Iron: 61 ug/dL (ref 27–159)
Total Iron Binding Capacity: 218 ug/dL — ABNORMAL LOW (ref 250–450)
UIBC: 157 ug/dL (ref 131–425)

## 2023-10-21 LAB — B12 AND FOLATE PANEL
Folate: 13.6 ng/mL (ref >3.0)
Vitamin B-12: 308 pg/mL (ref 232–1245)

## 2023-10-22 ENCOUNTER — Encounter: Payer: Self-pay | Admitting: Physician Assistant

## 2023-10-22 NOTE — Progress Notes (Signed)
Hematology thinks high ferritin is due to your inflammation. Limit sugars and consider OTC turmeric 500mg  twice a day.   B12 on low normal. I do think we need to supplement in some way. 5 months ago it looked great. What dose of b12 were you at then?

## 2023-10-22 NOTE — Progress Notes (Signed)
Thoughts on this mutual patients elevated ferritin and next steps?

## 2023-11-11 ENCOUNTER — Telehealth: Payer: Self-pay | Admitting: Physician Assistant

## 2023-11-16 NOTE — Telephone Encounter (Signed)
 PA auth approval received for Lubiprostone rx. Valid from 09/20/23 to 10/19/24.

## 2023-11-18 ENCOUNTER — Other Ambulatory Visit: Payer: Federal, State, Local not specified - PPO

## 2023-12-01 ENCOUNTER — Encounter: Payer: Self-pay | Admitting: Physician Assistant

## 2023-12-01 ENCOUNTER — Other Ambulatory Visit: Payer: Self-pay | Admitting: Hematology and Oncology

## 2023-12-01 ENCOUNTER — Inpatient Hospital Stay: Payer: Federal, State, Local not specified - PPO | Attending: Hematology and Oncology

## 2023-12-01 DIAGNOSIS — R7989 Other specified abnormal findings of blood chemistry: Secondary | ICD-10-CM | POA: Diagnosis not present

## 2023-12-01 LAB — CBC WITH DIFFERENTIAL (CANCER CENTER ONLY)
Abs Immature Granulocytes: 0 10*3/uL (ref 0.00–0.07)
Basophils Absolute: 0.1 10*3/uL (ref 0.0–0.1)
Basophils Relative: 2 %
Eosinophils Absolute: 0.3 10*3/uL (ref 0.0–0.5)
Eosinophils Relative: 8 %
HCT: 41.5 % (ref 36.0–46.0)
Hemoglobin: 13.3 g/dL (ref 12.0–15.0)
Immature Granulocytes: 0 %
Lymphocytes Relative: 33 %
Lymphs Abs: 1.5 10*3/uL (ref 0.7–4.0)
MCH: 29.3 pg (ref 26.0–34.0)
MCHC: 32 g/dL (ref 30.0–36.0)
MCV: 91.4 fL (ref 80.0–100.0)
Monocytes Absolute: 0.5 10*3/uL (ref 0.1–1.0)
Monocytes Relative: 11 %
Neutro Abs: 2.1 10*3/uL (ref 1.7–7.7)
Neutrophils Relative %: 46 %
Platelet Count: 374 10*3/uL (ref 150–400)
RBC: 4.54 MIL/uL (ref 3.87–5.11)
RDW: 13.6 % (ref 11.5–15.5)
WBC Count: 4.5 10*3/uL (ref 4.0–10.5)
nRBC: 0 % (ref 0.0–0.2)

## 2023-12-01 LAB — RETIC PANEL
Immature Retic Fract: 4.7 % (ref 2.3–15.9)
RBC.: 4.51 MIL/uL (ref 3.87–5.11)
Retic Count, Absolute: 42.8 10*3/uL (ref 19.0–186.0)
Retic Ct Pct: 1 % (ref 0.4–3.1)
Reticulocyte Hemoglobin: 32.3 pg (ref >27.9)

## 2023-12-01 LAB — CMP (CANCER CENTER ONLY)
ALT: 23 U/L (ref 0–44)
AST: 19 U/L (ref 15–41)
Albumin: 4.5 g/dL (ref 3.5–5.0)
Alkaline Phosphatase: 40 U/L (ref 38–126)
Anion gap: 4 — ABNORMAL LOW (ref 5–15)
BUN: 5 mg/dL — ABNORMAL LOW (ref 6–20)
CO2: 31 mmol/L (ref 22–32)
Calcium: 9.4 mg/dL (ref 8.9–10.3)
Chloride: 106 mmol/L (ref 98–111)
Creatinine: 0.69 mg/dL (ref 0.44–1.00)
GFR, Estimated: >60 mL/min (ref >60)
Glucose, Bld: 81 mg/dL (ref 70–99)
Potassium: 3.7 mmol/L (ref 3.5–5.1)
Sodium: 141 mmol/L (ref 135–145)
Total Bilirubin: 0.4 mg/dL (ref 0.0–1.2)
Total Protein: 7.4 g/dL (ref 6.5–8.1)

## 2023-12-01 LAB — IRON AND IRON BINDING CAPACITY (CC-WL,HP ONLY)
Iron: 67 ug/dL (ref 28–170)
Saturation Ratios: 27 % (ref 10.4–31.8)
TIBC: 246 ug/dL — ABNORMAL LOW (ref 250–450)
UIBC: 179 ug/dL (ref 148–442)

## 2023-12-01 LAB — FERRITIN: Ferritin: 497 ng/mL — ABNORMAL HIGH (ref 11–307)

## 2023-12-02 ENCOUNTER — Ambulatory Visit (INDEPENDENT_AMBULATORY_CARE_PROVIDER_SITE_OTHER): Admitting: Physician Assistant

## 2023-12-02 DIAGNOSIS — E538 Deficiency of other specified B group vitamins: Secondary | ICD-10-CM

## 2023-12-02 MED ORDER — CYANOCOBALAMIN 1000 MCG/ML IJ SOLN
1000.0000 ug | Freq: Once | INTRAMUSCULAR | Status: AC
Start: 2023-12-02 — End: 2023-12-02
  Administered 2023-12-02: 1000 ug via INTRAMUSCULAR

## 2023-12-02 NOTE — Progress Notes (Signed)
 Pt here for B12 injection.                         Pt given B12 in LD tolerated well. No redness or swelling noted at the site. RTC in 5wks for B12

## 2023-12-13 ENCOUNTER — Telehealth: Payer: Self-pay | Admitting: Physician Assistant

## 2023-12-13 ENCOUNTER — Encounter: Payer: Self-pay | Admitting: Physician Assistant

## 2023-12-13 NOTE — Telephone Encounter (Signed)
 Copied from CRM [id number]. Topic: Clinical - Prescription Issue >> Dec 13, 2023  4:18 PM Turkey B wrote: Reason for CRM: pt called in states needs PA for, lisdexamfetamine (VYVANSE) 40 MG capsule

## 2023-12-14 ENCOUNTER — Telehealth: Payer: Self-pay

## 2023-12-14 ENCOUNTER — Other Ambulatory Visit (HOSPITAL_COMMUNITY): Payer: Self-pay

## 2023-12-14 ENCOUNTER — Encounter: Payer: Self-pay | Admitting: Hematology & Oncology

## 2023-12-14 NOTE — Telephone Encounter (Signed)
 Pharmacy Patient Advocate Encounter   Received notification from Pt Calls Messages that prior authorization for Vyvanse 40 capsules is required/requested.  Pt needs to provide current insurance info. Prior authorization is started pending insurance verification. Key [id number]

## 2023-12-15 ENCOUNTER — Telehealth: Payer: Self-pay

## 2023-12-15 ENCOUNTER — Other Ambulatory Visit (HOSPITAL_COMMUNITY): Payer: Self-pay

## 2023-12-15 NOTE — Telephone Encounter (Signed)
 Pharmacy Patient Advocate Encounter  Received notification from [facility] that Prior Authorization for Vyvanse 40 has been APPROVED from 12/14/23 to 12/13/24. Ran test claim, Copay is $7.50. This test claim was processed through [facility]- copay amounts may vary at other pharmacies due to pharmacy/plan contracts, or as the patient moves through the different stages of their insurance plan.   PA #/Case ID/Reference #:  [id number]

## 2023-12-16 ENCOUNTER — Other Ambulatory Visit (HOSPITAL_COMMUNITY): Payer: Self-pay

## 2023-12-17 ENCOUNTER — Telehealth: Payer: Self-pay | Admitting: *Deleted

## 2023-12-17 NOTE — Telephone Encounter (Signed)
-----   Message from Ulysees Barns IV sent at 12/14/2023  3:03 PM EDT ----- Please let April Vega know her ferritin levels drop and fluctuate most recently ferritin 497 from 851. We will see her back with repeat labs in late May 2025 ----- Message ----- From: Leory Plowman, Lab In [place] Sent: 12/01/2023   8:15 AM EDT To: Jaci Standard, MD

## 2023-12-17 NOTE — Telephone Encounter (Signed)
 TCT patient regarding recent lab results. Spoke with her. Advised that her ferritin levels drop and fluctuate most recently ferritin 497 from 851. We will see her back with repeat labs in late May 2025. Provided her with appt times. Pt voiced understanding.

## 2023-12-20 ENCOUNTER — Other Ambulatory Visit (HOSPITAL_COMMUNITY): Payer: Self-pay

## 2023-12-21 ENCOUNTER — Other Ambulatory Visit (HOSPITAL_COMMUNITY): Payer: Self-pay

## 2023-12-21 NOTE — Telephone Encounter (Signed)
 Pharmacy Patient Advocate Encounter  Received notification from [facility] that Prior Authorization for Vyvanse 40 has been APPROVED from 11/16/23 to 12/13/24. Ran test claim, Copay is $7.50. This test claim was processed through [facility]- copay amounts may vary at other pharmacies due to pharmacy/plan contracts, or as the patient moves through the different stages of their insurance plan.   PA #/Case ID/Reference #: [id number]

## 2023-12-21 NOTE — Telephone Encounter (Signed)
error 

## 2024-01-06 ENCOUNTER — Ambulatory Visit

## 2024-01-10 ENCOUNTER — Telehealth: Payer: Self-pay | Admitting: Physician Assistant

## 2024-01-10 NOTE — Telephone Encounter (Unsigned)
 Copied from CRM [id number]. Topic: Appointments - Scheduling Inquiry for Clinic >> Jan 10, 2024  4:24 PM Tiffany H wrote: Reason for CRM: Patient called to request to condense B12 appointment with Rodger Civil on Wednesday. Patient is scheduled for lab visit about an hour after her appointment with Jade. Patient would like to know if Jade can give her the B12 shot instead. Please assist.

## 2024-01-12 ENCOUNTER — Encounter: Payer: Self-pay | Admitting: Hematology & Oncology

## 2024-01-12 ENCOUNTER — Ambulatory Visit

## 2024-01-12 ENCOUNTER — Other Ambulatory Visit (HOSPITAL_COMMUNITY): Payer: Self-pay

## 2024-01-12 ENCOUNTER — Encounter: Payer: Self-pay | Admitting: Physician Assistant

## 2024-01-12 ENCOUNTER — Telehealth: Payer: Self-pay

## 2024-01-12 ENCOUNTER — Ambulatory Visit: Admitting: Physician Assistant

## 2024-01-12 VITALS — BP 126/76 | HR 72

## 2024-01-12 DIAGNOSIS — E538 Deficiency of other specified B group vitamins: Secondary | ICD-10-CM | POA: Diagnosis not present

## 2024-01-12 DIAGNOSIS — F909 Attention-deficit hyperactivity disorder, unspecified type: Secondary | ICD-10-CM | POA: Diagnosis not present

## 2024-01-12 DIAGNOSIS — N3281 Overactive bladder: Secondary | ICD-10-CM | POA: Diagnosis not present

## 2024-01-12 DIAGNOSIS — K5909 Other constipation: Secondary | ICD-10-CM | POA: Diagnosis not present

## 2024-01-12 DIAGNOSIS — M84375S Stress fracture, left foot, sequela: Secondary | ICD-10-CM

## 2024-01-12 MED ORDER — LISDEXAMFETAMINE DIMESYLATE 60 MG PO CAPS: 60.0000 mg | ORAL_CAPSULE | ORAL | 0 refills | Status: DC

## 2024-01-12 MED ORDER — LINACLOTIDE 290 MCG PO CAPS: 290.0000 ug | ORAL_CAPSULE | Freq: Every day | ORAL | 1 refills | Status: DC

## 2024-01-12 MED ORDER — CYANOCOBALAMIN 1000 MCG/ML IJ SOLN
1000.0000 ug | Freq: Once | INTRAMUSCULAR | Status: AC
  Administered 2024-01-12: 1000 ug via INTRAMUSCULAR

## 2024-01-12 MED ORDER — MIRABEGRON ER 50 MG PO TB24: 50.0000 mg | ORAL_TABLET | Freq: Every day | ORAL | 1 refills | Status: DC
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
## 2024-01-12 NOTE — Telephone Encounter (Signed)
 Pharmacy Patient Advocate Encounter   Received notification from Patient Pharmacy that prior authorization for Linzess  is required/requested.   Insurance verification completed.   The patient is insured through [facility] .   Per test claim: PA required; PA submitted to above mentioned insurance via CoverMyMeds Key/confirmation #/EOC [id number] Status is pending

## 2024-01-12 NOTE — Patient Instructions (Addendum)
 Increase vyvanse  60mg  Stop amitiza  and try to start linzess  for constipation Prolia  shot nurse visit June.

## 2024-01-13 ENCOUNTER — Other Ambulatory Visit (HOSPITAL_COMMUNITY): Payer: Self-pay

## 2024-01-13 NOTE — Telephone Encounter (Signed)
 Pt notified via mychart

## 2024-01-13 NOTE — Telephone Encounter (Signed)
 Pharmacy Patient Advocate Encounter  Received notification from [facility] that Prior Authorization for Linzess  290 mcg has been APPROVED from 01/12/24 to 01/11/25. Unable to obtain price due to refill too soon rejection, last fill date 01/11/25 next available fill date6/29/25   PA #/Case ID/Reference #: [id number]

## 2024-01-24 ENCOUNTER — Telehealth: Payer: Self-pay | Admitting: Hematology and Oncology

## 2024-01-26 ENCOUNTER — Other Ambulatory Visit (HOSPITAL_COMMUNITY): Payer: Self-pay

## 2024-01-26 ENCOUNTER — Telehealth: Payer: Self-pay

## 2024-01-26 NOTE — Telephone Encounter (Signed)
 Prolia  VOB initiated via [email or web address]  Next Prolia  inj DUE: 03/20/24

## 2024-01-26 NOTE — Telephone Encounter (Signed)
 April Vega

## 2024-01-26 NOTE — Telephone Encounter (Signed)
 PA submitted via fax for buy and bill.   PA for pharmacy benefit effective through 09/20/24.

## 2024-01-31 ENCOUNTER — Other Ambulatory Visit (HOSPITAL_COMMUNITY): Payer: Self-pay

## 2024-01-31 NOTE — Telephone Encounter (Signed)
 Pharmacy Patient Advocate Encounter  Received notification from [facility] that Prior Authorization for Prolia  has been CANCELLED due to a prior authorization is not required for medical buy and bill.    Per the representative, if filling via the speicalty pharmacy, will need to contact [facility].

## 2024-01-31 NOTE — Telephone Encounter (Signed)
 Pt ready for scheduling for PROLIA  on or after : 03/20/24  Option# 1: Buy/Bill (Office supplied medication)  Out-of-pocket cost due at time of clinic visit: $267  Number of injection/visits approved: ---  Primary: [employer]-FE[employer] Prolia  co-insurance: 15% Admin fee co-insurance: 15%  Secondary: --- Prolia  co-insurance:  Admin fee co-insurance:   Medical Benefit Details: Date Benefits were checked: 01/26/24 Deductible: $350 Met of $350 Required/ Coinsurance: 15%/ Admin Fee: 15%  Prior Auth: N/A PA# Expiration Date:   # of doses approved: ----------------------------------------------------------------------- Option# 2- Med Obtained from pharmacy:  Pharmacy benefit: Copay $--- MUST FILL AT [facility] (Paid to pharmacy) Admin Fee: 15% (Pay at clinic)  Prior Auth: APPROVED PA#  Expiration Date: 08/22/23-09/20/24  # of doses approved: 2   If patient wants fill through the pharmacy benefit please send prescription to: [facility], and include estimated need by date in rx notes. Pharmacy will ship medication directly to the office.  Patient NOT eligible for Prolia  Copay Card. Copay Card can make patient's cost as little as $25. Link to apply: [email or web address]  ** This summary of benefits is an estimation of the patient's out-of-pocket cost. Exact cost may very based on individual plan coverage.

## 2024-02-07 DIAGNOSIS — M84369P Stress fracture, unspecified tibia and fibula, subsequent encounter for fracture with malunion: Secondary | ICD-10-CM | POA: Diagnosis not present

## 2024-02-18 ENCOUNTER — Ambulatory Visit: Payer: Federal, State, Local not specified - PPO | Admitting: Physician Assistant

## 2024-02-18 ENCOUNTER — Other Ambulatory Visit: Payer: Federal, State, Local not specified - PPO

## 2024-02-25 ENCOUNTER — Other Ambulatory Visit: Payer: Self-pay | Admitting: Hematology and Oncology

## 2024-02-25 ENCOUNTER — Inpatient Hospital Stay (HOSPITAL_BASED_OUTPATIENT_CLINIC_OR_DEPARTMENT_OTHER): Admitting: Hematology and Oncology

## 2024-02-25 ENCOUNTER — Inpatient Hospital Stay: Attending: Hematology and Oncology

## 2024-02-25 VITALS — BP 123/78 | HR 68 | Temp 98.7°F | Resp 17 | Wt 112.0 lb

## 2024-02-25 DIAGNOSIS — Z86718 Personal history of other venous thrombosis and embolism: Secondary | ICD-10-CM | POA: Diagnosis not present

## 2024-02-25 DIAGNOSIS — R7989 Other specified abnormal findings of blood chemistry: Secondary | ICD-10-CM

## 2024-02-25 DIAGNOSIS — K5909 Other constipation: Secondary | ICD-10-CM | POA: Diagnosis not present

## 2024-02-25 DIAGNOSIS — R790 Abnormal level of blood mineral: Secondary | ICD-10-CM | POA: Insufficient documentation

## 2024-02-25 DIAGNOSIS — R140 Abdominal distension (gaseous): Secondary | ICD-10-CM | POA: Diagnosis not present

## 2024-02-25 LAB — CMP (CANCER CENTER ONLY)
ALT: 20 U/L (ref 0–44)
AST: 17 U/L (ref 15–41)
Albumin: 4.3 g/dL (ref 3.5–5.0)
Alkaline Phosphatase: 32 U/L — ABNORMAL LOW (ref 38–126)
Anion gap: 6 (ref 5–15)
BUN: 5 mg/dL — ABNORMAL LOW (ref 6–20)
CO2: 32 mmol/L (ref 22–32)
Calcium: 9.5 mg/dL (ref 8.9–10.3)
Chloride: 104 mmol/L (ref 98–111)
Creatinine: 0.67 mg/dL (ref 0.44–1.00)
GFR, Estimated: >60 mL/min (ref >60)
Glucose, Bld: 66 mg/dL — ABNORMAL LOW (ref 70–99)
Potassium: 3.6 mmol/L (ref 3.5–5.1)
Sodium: 142 mmol/L (ref 135–145)
Total Bilirubin: 0.4 mg/dL (ref 0.0–1.2)
Total Protein: 7.3 g/dL (ref 6.5–8.1)

## 2024-02-25 LAB — CBC WITH DIFFERENTIAL (CANCER CENTER ONLY)
Abs Immature Granulocytes: 0.01 10*3/uL (ref 0.00–0.07)
Basophils Absolute: 0.1 10*3/uL (ref 0.0–0.1)
Basophils Relative: 2 %
Eosinophils Absolute: 0.4 10*3/uL (ref 0.0–0.5)
Eosinophils Relative: 9 %
HCT: 40.4 % (ref 36.0–46.0)
Hemoglobin: 13.2 g/dL (ref 12.0–15.0)
Immature Granulocytes: 0 %
Lymphocytes Relative: 30 %
Lymphs Abs: 1.3 10*3/uL (ref 0.7–4.0)
MCH: 28.7 pg (ref 26.0–34.0)
MCHC: 32.7 g/dL (ref 30.0–36.0)
MCV: 87.8 fL (ref 80.0–100.0)
Monocytes Absolute: 0.4 10*3/uL (ref 0.1–1.0)
Monocytes Relative: 10 %
Neutro Abs: 2.1 10*3/uL (ref 1.7–7.7)
Neutrophils Relative %: 49 %
Platelet Count: 321 10*3/uL (ref 150–400)
RBC: 4.6 MIL/uL (ref 3.87–5.11)
RDW: 14 % (ref 11.5–15.5)
WBC Count: 4.3 10*3/uL (ref 4.0–10.5)
nRBC: 0 % (ref 0.0–0.2)

## 2024-02-25 LAB — FERRITIN: Ferritin: 764 ng/mL — ABNORMAL HIGH (ref 11–307)

## 2024-02-25 LAB — C-REACTIVE PROTEIN: CRP: <0.5 mg/dL (ref <1.0)

## 2024-02-25 LAB — SEDIMENTATION RATE: Sed Rate: 5 mm/h (ref 0–22)

## 2024-02-25 NOTE — Progress Notes (Signed)
 [facility] [phone]   [phone]  PROGRESS NOTE  Patient Care Team: [name], Jade L, PA-C as PCP - General (Family Medicine) Gean Keels, MD as Consulting Physician (Family Medicine)  Hematological/Oncological History # Elevated Ferritin  12/22/2022: establish care with Dr. Rosaline Coma   Interval History:  April Vega 53 y.o. female with medical history significant for elevated ferritin and prior provoked VTE who presents for a follow up visit. The patient's last visit was on 08/18/2023. In the interim since the last visit she has had no major changes in her health.  On exam today April Vega reports she has been well overall in the interim since her last visit.  She has had no hospitalizations, ER visits, or new medications.  Reports she is not having any runny nose, sore throat, cough.  She notes that her energy level is about a 5 out of 10 and is normally "so-so".  She reports her appetite is good and she is not engaging any special diets at the moment.  She reports that she does continue to have some pain from the stress fracture but no other joint pain or body pain.  Overall her health has been steady in interim since her last visit.  Full 10 point ROS is otherwise negative.  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   Menometrorrhagia 01/02/2016    SURGICAL HISTORY: Past Surgical History:  Procedure Laterality Date   bone graph  01/1990   CESAREAN SECTION     CORONARY ULTRASOUND/IVUS Left 05/12/2017   Procedure: Intravascular Ultrasound/IVUS;  Surgeon: Adine Hoof, MD;  Location: [facility] INVASIVE CV LAB;  Service: Cardiovascular;  Laterality:  Left;  IVC TO LT POPLITEAL VEIN   CORONARY ULTRASOUND/IVUS N/A 03/07/2018   Procedure: INTRAVASCULAR ULTRASOUND/IVUS;  Surgeon: Adine Hoof, MD;  Location: [facility] INVASIVE CV LAB;  Service: Cardiovascular;  Laterality: N/A;   ENDOMETRIAL ABLATION W/ NOVASURE  2019   LOWER EXTREMITY VENOGRAPHY Bilateral 05/11/2017   Procedure: Bilateral Lower Extremity Venography;  Surgeon: Margherita Shell, MD;  Location: [facility] INVASIVE CV LAB;  Service: Cardiovascular;  Laterality: Bilateral;   LOWER EXTREMITY VENOGRAPHY N/A 03/07/2018   Procedure: LOWER EXTREMITY VENOGRAPHY;  Surgeon: Adine Hoof, MD;  Location: [facility] INVASIVE CV LAB;  Service: Cardiovascular;  Laterality: N/A;   PERIPHERAL VASCULAR BALLOON ANGIOPLASTY Left 03/07/2018   Procedure: PERIPHERAL VASCULAR BALLOON ANGIOPLASTY;  Surgeon: Adine Hoof, MD;  Location: [facility] INVASIVE CV LAB;  Service: Cardiovascular;  Laterality: Left;  left common iliac vein   PERIPHERAL VASCULAR INTERVENTION Left 05/12/2017   Procedure: PERIPHERAL VASCULAR INTERVENTION;  Surgeon: Adine Hoof, MD;  Location: [facility] INVASIVE CV LAB;  Service: Cardiovascular;  Laterality: Left;  IVC TO LT COMMON FEM VEIN  STENT   PERIPHERAL VASCULAR INTERVENTION Right 03/07/2018   Procedure: PERIPHERAL VASCULAR INTERVENTION;  Surgeon: Adine Hoof, MD;  Location: [facility] INVASIVE CV LAB;  Service: Cardiovascular;  Laterality: Right;  right common iliac vein   PERIPHERAL VASCULAR THROMBECTOMY Left 05/11/2017   Procedure: PERIPHERAL VASCULAR THROMBECTOMY;  Surgeon: Margherita Shell, MD;  Location: [facility] INVASIVE CV LAB;  Service: Cardiovascular;  Laterality: Left;  left lower extremity venous  SOCIAL HISTORY: Social History   Socioeconomic History   Marital status: Married    Spouse name: Not on file   Number of children: Not on file   Years of education: Not on file   Highest education level: Master's degree (e.g., MA, MS, MEng, MEd, MSW, MBA)   Occupational History   Occupation: Runner, broadcasting/film/video  Tobacco Use   Smoking status: Never    Passive exposure: Never   Smokeless tobacco: Never  Vaping Use   Vaping status: Never Used  Substance and Sexual Activity   Alcohol use: No    Alcohol/week: 0.0 standard drinks of alcohol   Drug use: No   Sexual activity: Yes    Partners: Male    Birth control/protection: None    Comment: Occ use condoms  Other Topics Concern   Not on file  Social History Narrative   Not on file   Social Drivers of Health   Financial Resource Strain: Low Risk  (02/06/2024)   Received from [facility]   Overall Financial Resource Strain (CARDIA)    Difficulty of Paying Living Expenses: Not hard at all  Food Insecurity: No Food Insecurity (02/06/2024)   Received from [facility]   Hunger Vital Sign    Worried About Running Out of Food in the Last Year: Never true    Ran Out of Food in the Last Year: Never true  Transportation Needs: No Transportation Needs (02/06/2024)   Received from [facility] - Transportation    Lack of Transportation (Medical): No    Lack of Transportation (Non-Medical): No  Physical Activity: Insufficiently Active (02/06/2024)   Received from [facility]   Exercise Vital Sign    Days of Exercise per Week: 1 day    Minutes of Exercise per Session: 10 min  Stress: No Stress Concern Present (02/06/2024)   Received from [facility] - Occupational Stress Questionnaire    Feeling of Stress : Not at all  Social Connections: Socially Integrated (02/06/2024)   Received from [facility]   Social Network    How would you rate your social network (family, work, friends)?: Good participation with social networks  Intimate Partner Violence: Not At Risk (02/06/2024)   Received from [facility]   HITS    Over the last 12 months how often did your partner physically hurt you?: Never    Over the last 12 months how often did your  partner insult you or talk down to you?: Patient declined    Over the last 12 months how often did your partner threaten you with physical harm?: Never    Over the last 12 months how often did your partner scream or curse at you?: Patient declined    FAMILY HISTORY: Family History  Adopted: Yes    ALLERGIES:  is allergic to wellbutrin  [bupropion ] and latex.  MEDICATIONS:  Current Outpatient Medications  Medication Sig Dispense Refill   aspirin  81 MG tablet Take by mouth.     linaclotide  (LINZESS ) 290 MCG CAPS capsule Take 1 capsule (290 mcg total) by mouth daily. 90 capsule 1   mirabegron  ER (MYRBETRIQ ) 50 MG TB24 tablet Take 1 tablet (50 mg total) by mouth daily. 90 tablet 1   Vitamin D , Ergocalciferol , (DRISDOL ) 1.25 MG (50000 UNIT) CAPS capsule TAKE 1 CAPSULE BY MOUTH ONE TIME PER WEEK 12 capsule 3   No current facility-administered medications for this visit.    REVIEW OF SYSTEMS:   Constitutional: ( - )  fevers, ( - )  chills , ( - ) night sweats Eyes: ( - ) blurriness of vision, ( - ) double vision, ( - ) watery eyes Ears, nose, mouth, throat, and face: ( - ) mucositis, ( - ) sore throat Respiratory: ( - ) cough, ( - ) dyspnea, ( - ) wheezes Cardiovascular: ( - ) palpitation, ( - ) chest discomfort, ( - ) lower extremity swelling Gastrointestinal:  ( - ) nausea, ( - ) heartburn, ( - ) change in bowel habits Skin: ( - ) abnormal skin rashes Lymphatics: ( - ) new lymphadenopathy, ( - ) easy bruising Neurological: ( - ) numbness, ( - ) tingling, ( - ) new weaknesses Behavioral/Psych: ( - ) mood change, ( - ) new changes  All other systems were reviewed with the patient and are negative.  PHYSICAL EXAMINATION:  Vitals:   02/25/24 0809  BP: 123/78  Pulse: 68  Resp: 17  Temp: 98.7 F (37.1 C)  SpO2: 100%   Filed Weights   02/25/24 0809  Weight: 112 lb (50.8 kg)    GENERAL: Well-appearing middle-age African-American female, alert, no distress and comfortable SKIN:  skin color, texture, turgor are normal, no rashes or significant lesions EYES: conjunctiva are pink and non-injected, sclera clear LUNGS: clear to auscultation and percussion with normal breathing effort HEART: regular rate & rhythm and no murmurs and no lower extremity edema Musculoskeletal: no cyanosis of digits and no clubbing  PSYCH: alert & oriented x 3, fluent speech NEURO: no focal motor/sensory deficits  LABORATORY DATA:  I have reviewed the data as listed    Latest Ref Rng & Units 02/25/2024    7:49 AM 12/01/2023    8:05 AM 10/20/2023    8:24 AM  CBC  WBC 4.0 - 10.5 K/uL 4.3  4.5  4.4   Hemoglobin 12.0 - 15.0 g/dL 29.5  62.1  30.8   Hematocrit 36.0 - 46.0 % 40.4  41.5  39.3   Platelets 150 - 400 K/uL 321  374  377        Latest Ref Rng & Units 02/25/2024    7:49 AM 12/01/2023    8:05 AM 10/20/2023    8:24 AM  CMP  Glucose 70 - 99 mg/dL 66  81  79   BUN 6 - 20 mg/dL 5  5  5    Creatinine 0.44 - 1.00 mg/dL 6.57  8.46  9.62   Sodium 135 - 145 mmol/L 142  141  140   Potassium 3.5 - 5.1 mmol/L 3.6  3.7  3.9   Chloride 98 - 111 mmol/L 104  106  103   CO2 22 - 32 mmol/L 32  31  24   Calcium 8.9 - 10.3 mg/dL 9.5  9.4  9.2   Total Protein 6.5 - 8.1 g/dL 7.3  7.4  6.8   Total Bilirubin 0.0 - 1.2 mg/dL 0.4  0.4  0.4   Alkaline Phos 38 - 126 U/L 32  40  46   AST 15 - 41 U/L 17  19  19    ALT 0 - 44 U/L 20  23  19      RADIOGRAPHIC STUDIES: No results found.  ASSESSMENT & PLAN April Vega 53 y.o. female with medical history significant for elevated ferritin and prior provoked VTE who presents for a follow up visit.   # Elevated Ferritin --likely multifactorial, secondary to iron overload from chronic PO iron therapy and chronic inflammation. Discontinued in February  2024 after last lab results showing ferritin level of 1111 ng/mL.  --Levels have fluctuated over time, this would favor an inflammatory process rather than true iron overload.  No clear indication for  phlebotomy at this time. --labs to include CBC, CMP, ESR, CRP, iron panel and ferritin today --no suspicion for hereditary hemachromatosis given negative genetic testing, also prior labs showed normal serum iron and saturation levels and low TIBC levels.  --labs today show white blood cell 4.3, Hgb 13.2, MCV 87.8, Plt 321. Ferritin pending.  --RTC in 6 months for labs and 12 months for clinic visit.     # Provoked VTE -- prior LE DVT in setting of estrogen use -- not currently on anticoagulation.   No orders of the defined types were placed in this encounter.   All questions were answered. The patient knows to call the clinic with any problems, questions or concerns.  A total of more than 25 minutes, including preparing to see the patient, ordering tests and/or medications, counseling the patient and coordination of care as outlined above.   Rogerio Clay, MD Department of Hematology/Oncology [facility] at [facility] Phone: [phone] Pager: [phone] Email: [email or web address]  02/25/2024 8:58 AM

## 2024-03-09 ENCOUNTER — Encounter: Payer: Self-pay | Admitting: Physician Assistant

## 2024-03-09 DIAGNOSIS — N6320 Unspecified lump in the left breast, unspecified quadrant: Secondary | ICD-10-CM

## 2024-03-09 NOTE — Telephone Encounter (Signed)
 Would you need the patient to schedule to have this evaluated?

## 2024-03-16 DIAGNOSIS — S82202D Unspecified fracture of shaft of left tibia, subsequent encounter for closed fracture with routine healing: Secondary | ICD-10-CM | POA: Diagnosis not present

## 2024-03-16 DIAGNOSIS — M79605 Pain in left leg: Secondary | ICD-10-CM | POA: Diagnosis not present

## 2024-03-16 DIAGNOSIS — Z8781 Personal history of (healed) traumatic fracture: Secondary | ICD-10-CM | POA: Diagnosis not present

## 2024-03-16 DIAGNOSIS — S82402D Unspecified fracture of shaft of left fibula, subsequent encounter for closed fracture with routine healing: Secondary | ICD-10-CM | POA: Diagnosis not present

## 2024-03-16 NOTE — Telephone Encounter (Signed)
 I see it under orders but not under referrals so it didn't fall in my WQ and doesn't let me edit it to send to a location. Let me see if I can figure out how to do it.

## 2024-03-16 NOTE — Telephone Encounter (Signed)
 Hi April Vega,  I do see a order for a diagnostic mammogram showing in patient chart that was ordered yesterday?

## 2024-03-16 NOTE — Telephone Encounter (Signed)
 NO referral has been placed.Thanks.

## 2024-03-17 ENCOUNTER — Other Ambulatory Visit: Payer: Self-pay | Admitting: Family Medicine

## 2024-03-17 DIAGNOSIS — N6320 Unspecified lump in the left breast, unspecified quadrant: Secondary | ICD-10-CM

## 2024-03-17 NOTE — Telephone Encounter (Signed)
 Orders faxed to [facility] at fax# [phone]- confirmation received.

## 2024-03-17 NOTE — Telephone Encounter (Signed)
 New ordered placed.  Please fax to [facility] in [place].

## 2024-03-17 NOTE — Addendum Note (Signed)
 Addended by: Denard Tuminello D on: 03/17/2024 11:34 AM   Modules accepted: Orders

## 2024-03-30 DIAGNOSIS — N6320 Unspecified lump in the left breast, unspecified quadrant: Secondary | ICD-10-CM | POA: Diagnosis not present

## 2024-03-30 DIAGNOSIS — N6002 Solitary cyst of left breast: Secondary | ICD-10-CM | POA: Diagnosis not present

## 2024-03-30 DIAGNOSIS — M6289 Other specified disorders of muscle: Secondary | ICD-10-CM | POA: Diagnosis not present

## 2024-03-30 DIAGNOSIS — R92332 Mammographic heterogeneous density, left breast: Secondary | ICD-10-CM | POA: Diagnosis not present

## 2024-03-30 LAB — HM MAMMOGRAPHY

## 2024-03-31 ENCOUNTER — Encounter: Payer: Self-pay | Admitting: Family Medicine

## 2024-04-06 ENCOUNTER — Other Ambulatory Visit: Payer: Self-pay | Admitting: *Deleted

## 2024-04-06 DIAGNOSIS — I871 Compression of vein: Secondary | ICD-10-CM

## 2024-04-06 DIAGNOSIS — Z86718 Personal history of other venous thrombosis and embolism: Secondary | ICD-10-CM

## 2024-04-12 ENCOUNTER — Other Ambulatory Visit: Payer: Self-pay | Admitting: Physician Assistant

## 2024-04-12 ENCOUNTER — Telehealth: Payer: Self-pay

## 2024-04-12 DIAGNOSIS — N3281 Overactive bladder: Secondary | ICD-10-CM

## 2024-04-12 DIAGNOSIS — R7989 Other specified abnormal findings of blood chemistry: Secondary | ICD-10-CM

## 2024-04-12 DIAGNOSIS — E538 Deficiency of other specified B group vitamins: Secondary | ICD-10-CM

## 2024-04-12 NOTE — Telephone Encounter (Signed)
 Copied from CRM [id number]. Topic: Clinical - Request for Lab/Test Order >> Apr 12, 2024  1:34 PM Zane F wrote: Reason for CRM:   Patient would like to have labs ordered to cover CBC panel. She is looking to check her ferratin and B12 levels.   Call patient to discuss if an appointment is needed to have the labs completed.   Callback Number: [phone]

## 2024-04-12 NOTE — Telephone Encounter (Unsigned)
 Copied from CRM #[id number]. Topic: Clinical - Medication Refill >> Apr 12, 2024  1:36 PM Zane F wrote: Patient is looking to have her vyvanse  prescription refilled. The specialist was unable to locate the prescription under her current med list. The patient did state that she receives it in generic form. Please review discontinued medication the specialist was able to find and ensure it is filled appropriately. Patient did inform specialist that the pharmacy requested a prior authorization for the refill/ increased dose.   Medication:   lisdexamfetamine (VYVANSE ) 60 MG capsule   Has the patient contacted their pharmacy? Yes   This is the patient's preferred pharmacy:  [phone] - [place], [place] - [address] [place] [address] Phone: [phone] Fax: [phone]  Is this the correct pharmacy for this prescription? Yes   Has the prescription been filled recently? No  Is the patient out of the medication? Yes  Has the patient been seen for an appointment in the last year OR does the patient have an upcoming appointment? Yes  Can we respond through MyChart? Yes  Agent: Please be advised that Rx refills may take up to 3 business days. We ask that you follow-up with your pharmacy.

## 2024-04-13 DIAGNOSIS — M6289 Other specified disorders of muscle: Secondary | ICD-10-CM | POA: Diagnosis not present

## 2024-04-14 NOTE — Telephone Encounter (Signed)
 Called patient . Left a detailed voice mail message that lab orders are in her chart and she can stop by the office at her convenience to have this lab work drawn .

## 2024-04-19 ENCOUNTER — Other Ambulatory Visit (HOSPITAL_COMMUNITY): Payer: Self-pay | Admitting: Physician Assistant

## 2024-04-19 ENCOUNTER — Ambulatory Visit (HOSPITAL_BASED_OUTPATIENT_CLINIC_OR_DEPARTMENT_OTHER)
Admission: RE | Admit: 2024-04-19 | Discharge: 2024-04-19 | Disposition: A | Discharge status: Home / Self Care | Source: Ambulatory Visit | Attending: Vascular Surgery | Admitting: Vascular Surgery

## 2024-04-19 ENCOUNTER — Other Ambulatory Visit: Payer: Self-pay | Admitting: *Deleted

## 2024-04-19 ENCOUNTER — Ambulatory Visit

## 2024-04-19 ENCOUNTER — Other Ambulatory Visit: Payer: Self-pay | Admitting: Physician Assistant

## 2024-04-19 ENCOUNTER — Ambulatory Visit (HOSPITAL_COMMUNITY)
Admission: RE | Admit: 2024-04-19 | Discharge: 2024-04-19 | Disposition: A | Discharge status: Home / Self Care | Source: Ambulatory Visit | Attending: Vascular Surgery | Admitting: Vascular Surgery

## 2024-04-19 DIAGNOSIS — I871 Compression of vein: Secondary | ICD-10-CM | POA: Diagnosis not present

## 2024-04-19 DIAGNOSIS — Z86718 Personal history of other venous thrombosis and embolism: Secondary | ICD-10-CM | POA: Insufficient documentation

## 2024-04-19 DIAGNOSIS — R600 Localized edema: Secondary | ICD-10-CM

## 2024-04-19 NOTE — Progress Notes (Signed)
US orders placed.

## 2024-04-21 ENCOUNTER — Ambulatory Visit (HOSPITAL_COMMUNITY)
Admission: RE | Admit: 2024-04-21 | Discharge: 2024-04-21 | Disposition: A | Discharge status: Home / Self Care | Source: Ambulatory Visit | Attending: Physician Assistant | Admitting: Physician Assistant

## 2024-04-21 DIAGNOSIS — R600 Localized edema: Secondary | ICD-10-CM | POA: Diagnosis not present

## 2024-04-26 ENCOUNTER — Encounter (HOSPITAL_COMMUNITY)

## 2024-04-28 DIAGNOSIS — E538 Deficiency of other specified B group vitamins: Secondary | ICD-10-CM | POA: Diagnosis not present

## 2024-04-28 DIAGNOSIS — R7989 Other specified abnormal findings of blood chemistry: Secondary | ICD-10-CM | POA: Diagnosis not present

## 2024-04-29 LAB — VITAMIN B12: Vitamin B-12: 222 pg/mL — ABNORMAL LOW (ref 232–1245)

## 2024-04-29 LAB — IRON,TIBC AND FERRITIN PANEL
Ferritin: 749 ng/mL — ABNORMAL HIGH (ref 15–150)
Iron Saturation: 21 % (ref 15–55)
Iron: 49 ug/dL (ref 27–159)
Total Iron Binding Capacity: 235 ug/dL — ABNORMAL LOW (ref 250–450)
UIBC: 186 ug/dL (ref 131–425)

## 2024-04-29 LAB — CBC WITH DIFFERENTIAL/PLATELET
Basophils Absolute: 0.1 x10E3/uL (ref 0.0–0.2)
Basos: 2 %
EOS (ABSOLUTE): 0.3 x10E3/uL (ref 0.0–0.4)
Eos: 5 %
Hematocrit: 38.1 % (ref 34.0–46.6)
Hemoglobin: 12.2 g/dL (ref 11.1–15.9)
Immature Grans (Abs): 0 x10E3/uL (ref 0.0–0.1)
Immature Granulocytes: 0 %
Lymphocytes Absolute: 1.6 x10E3/uL (ref 0.7–3.1)
Lymphs: 32 %
MCH: 29 pg (ref 26.6–33.0)
MCHC: 32 g/dL (ref 31.5–35.7)
MCV: 91 fL (ref 79–97)
Monocytes Absolute: 0.5 x10E3/uL (ref 0.1–0.9)
Monocytes: 11 %
Neutrophils Absolute: 2.5 x10E3/uL (ref 1.4–7.0)
Neutrophils: 50 %
Platelets: 375 x10E3/uL (ref 150–450)
RBC: 4.21 x10E6/uL (ref 3.77–5.28)
RDW: 13.1 % (ref 11.7–15.4)
WBC: 5.1 x10E3/uL (ref 3.4–10.8)

## 2024-05-02 ENCOUNTER — Ambulatory Visit: Payer: Self-pay | Admitting: Physician Assistant

## 2024-05-02 NOTE — Progress Notes (Signed)
 April Vega,   B12 remains low. I think getting a b12 shot monthly for next 3 months and then recheck b12 level.

## 2024-05-02 NOTE — Progress Notes (Signed)
 Maebelle,   B12 remains low. I think getting a b12 shot monthly for next 3 months and then recheck b12 level.

## 2024-05-17 ENCOUNTER — Ambulatory Visit

## 2024-05-25 ENCOUNTER — Ambulatory Visit: Attending: Vascular Surgery | Admitting: Physician Assistant

## 2024-05-25 ENCOUNTER — Ambulatory Visit (INDEPENDENT_AMBULATORY_CARE_PROVIDER_SITE_OTHER)

## 2024-05-25 ENCOUNTER — Telehealth: Payer: Self-pay

## 2024-05-25 VITALS — BP 115/75 | HR 76 | Temp 97.9°F | Wt 116.1 lb

## 2024-05-25 VITALS — BP 121/70 | HR 69

## 2024-05-25 DIAGNOSIS — Z86718 Personal history of other venous thrombosis and embolism: Secondary | ICD-10-CM

## 2024-05-25 DIAGNOSIS — I871 Compression of vein: Secondary | ICD-10-CM

## 2024-05-25 DIAGNOSIS — E538 Deficiency of other specified B group vitamins: Secondary | ICD-10-CM

## 2024-05-25 DIAGNOSIS — F909 Attention-deficit hyperactivity disorder, unspecified type: Secondary | ICD-10-CM

## 2024-05-25 MED ORDER — CYANOCOBALAMIN 1000 MCG/ML IJ SOLN
1000.0000 ug | Freq: Once | INTRAMUSCULAR | Status: AC
  Administered 2024-05-25: 1000 ug via INTRAMUSCULAR

## 2024-05-25 NOTE — Progress Notes (Cosign Needed Addendum)
 Injection was tolerated well by the patient. (See MAR for injection details)

## 2024-05-25 NOTE — Progress Notes (Signed)
 Office Note     CC:  follow up Requesting Provider:  Antoniette Vermell CROME, PA-C  HPI: April Vega is a 53 y.o. (07/29/1971) female who presents for routine follow-up after DVT.  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 Sheree.  She subsequently required stenting of the right common iliac vein with balloon angioplasty of the existing left common iliac vein stent on 03/07/2018 by Dr. Sheree due to bilateral lower leg swelling and acute thrombus.  Since last office visit last year she denies any changes with left lower extremity edema.  She wears compression semiregularly.  She elevates her legs when possible during the day.  She is very active and does not sit or stand for long peers of time.  She is on a daily aspirin .   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   Menometrorrhagia 01/02/2016    Past Surgical History:  Procedure Laterality Date   bone graph  01/1990   CESAREAN SECTION     CORONARY ULTRASOUND/IVUS Left 05/12/2017   Procedure: Intravascular Ultrasound/IVUS;  Surgeon: Sheree Penne Bruckner, MD;  Location: [facility] INVASIVE CV LAB;  Service: Cardiovascular;  Laterality: Left;  IVC TO LT POPLITEAL VEIN   CORONARY ULTRASOUND/IVUS N/A 03/07/2018   Procedure: INTRAVASCULAR ULTRASOUND/IVUS;  Surgeon: Sheree Penne Bruckner, MD;  Location: [facility] INVASIVE CV LAB;  Service: Cardiovascular;  Laterality: N/A;   ENDOMETRIAL ABLATION W/ NOVASURE  2019   LOWER EXTREMITY VENOGRAPHY Bilateral 05/11/2017   Procedure: Bilateral Lower Extremity Venography;  Surgeon: Serene Gaile ORN, MD;  Location: [facility] INVASIVE CV LAB;  Service: Cardiovascular;  Laterality: Bilateral;   LOWER EXTREMITY VENOGRAPHY N/A 03/07/2018   Procedure: LOWER EXTREMITY VENOGRAPHY;  Surgeon: Sheree Penne Bruckner, MD;  Location[facility] INVASIVE CV LAB;  Service: Cardiovascular;  Laterality: N/A;   PERIPHERAL VASCULAR BALLOON ANGIOPLASTY Left 03/07/2018   Procedure: PERIPHERAL VASCULAR BALLOON ANGIOPLASTY;  Surgeon: Sheree Penne Bruckner, MD;  Location: [facility] INVASIVE CV LAB;  Service: Cardiovascular;  Laterality: Left;  left common iliac vein   PERIPHERAL VASCULAR INTERVENTION Left 05/12/2017   Procedure: PERIPHERAL VASCULAR INTERVENTION;  Surgeon: Sheree Penne Bruckner, MD;  Location: [facility] INVASIVE CV LAB;  Service: Cardiovascular;  Laterality: Left;  IVC TO LT COMMON FEM VEIN  STENT   PERIPHERAL VASCULAR INTERVENTION Right 03/07/2018   Procedure: PERIPHERAL VASCULAR INTERVENTION;  Surgeon: Sheree Penne Bruckner, MD;  Location: [facility] INVASIVE CV LAB;  Service: Cardiovascular;  Laterality: Right;  right common iliac vein   PERIPHERAL VASCULAR THROMBECTOMY Left 05/11/2017   Procedure: PERIPHERAL VASCULAR THROMBECTOMY;  Surgeon: Serene Gaile ORN, MD;  Location: [facility] INVASIVE CV LAB;  Service: Cardiovascular;  Laterality: Left;  left lower extremity venous    Social History   Socioeconomic History   Marital status: Married    Spouse name: Not on file   Number of children: Not on file   Years of education: Not on file   Highest education level: Master's degree (e.g., MA, MS, MEng, MEd, MSW, MBA)  Occupational History   Occupation: teacher  Tobacco Use   Smoking status: Never    Passive  exposure: Never   Smokeless tobacco: Never  Vaping Use   Vaping status: Never Used  Substance and Sexual Activity   Alcohol use: No    Alcohol/week: 0.0 standard drinks of alcohol   Drug use: No   Sexual activity: Yes    Partners: Male    Birth control/protection: None    Comment: Occ use condoms  Other Topics  Concern   Not on file  Social History Narrative   Not on file   Social Drivers of Health   Financial Resource Strain: Low Risk  (02/06/2024)   Received from [facility]   Overall Financial Resource Strain (CARDIA)    Difficulty of Paying Living Expenses: Not hard at all  Food Insecurity: Low Risk  (03/16/2024)   Received from [facility]   Hunger Vital Sign    Within the past 12 months, you worried that your food would run out before you got money to buy more: Never true    Within the past 12 months, the food you bought just didn't last and you didn't have money to get more. : Never true  Transportation Needs: No Transportation Needs (03/16/2024)   Received from [facility]    In the past 12 months, has lack of reliable transportation kept you from medical appointments, meetings, work or from getting things needed for daily living? : No  Physical Activity: Insufficiently Active (02/06/2024)   Received from [facility]   Exercise Vital Sign    On average, how many days per week do you engage in moderate to strenuous exercise (like a brisk walk)?: 1 day    On average, how many minutes do you engage in exercise at this level?: 10 min  Stress: No Stress Concern Present (02/06/2024)   Received from [facility] - Occupational Stress Questionnaire    Feeling of Stress : Not at all  Social Connections: Socially Integrated (02/06/2024)   Received from [facility]   Social Network    How would you rate your social network (family, work, friends)?: Good participation with social networks  Intimate Partner Violence: Not At Risk (02/06/2024)   Received from [facility]   HITS    Over the last 12 months how often did your partner physically hurt you?: Never    Over the last 12 months how often did your partner insult you or talk down to you?: Patient declined    Over the last 12 months how often did your partner threaten you  with physical harm?: Never    Over the last 12 months how often did your partner scream or curse at you?: Patient declined    Family History  Adopted: Yes    Current Outpatient Medications  Medication Sig Dispense Refill   aspirin  81 MG tablet Take by mouth.     linaclotide  (LINZESS ) 290 MCG CAPS capsule Take 1 capsule (290 mcg total) by mouth daily. 90 capsule 1   mirabegron  ER (MYRBETRIQ ) 50 MG TB24 tablet Take 1 tablet (50 mg total) by mouth daily. 90 tablet 1   Vitamin D , Ergocalciferol , (DRISDOL ) 1.25 MG (50000 UNIT) CAPS capsule TAKE 1 CAPSULE BY MOUTH ONE TIME PER WEEK 12 capsule 3   No current facility-administered medications for this visit.    Allergies  Allergen Reactions   Wellbutrin  [Bupropion ] Other (See Comments)    Temporary memory loss   Latex Rash and Swelling     REVIEW OF SYSTEMS:  Negative unless  noted in HPI [X]  denotes positive finding, [ ]  denotes negative finding Cardiac  Comments:  Chest pain or chest pressure:    Shortness of breath upon exertion:    Short of breath when lying flat:    Irregular heart rhythm:        Vascular    Pain in calf, thigh, or hip brought on by ambulation:    Pain in feet at night that wakes you up from your sleep:     Blood clot in your veins:    Leg swelling:         Pulmonary    Oxygen at home:    Productive cough:     Wheezing:         Neurologic    Sudden weakness in arms or legs:     Sudden numbness in arms or legs:     Sudden onset of difficulty speaking or slurred speech:    Temporary loss of vision in one eye:     Problems with dizziness:         Gastrointestinal    Blood in stool:     Vomited blood:         Genitourinary    Burning when urinating:     Blood in urine:        Psychiatric    Major depression:         Hematologic    Bleeding problems:    Problems with blood clotting too easily:        Skin    Rashes or ulcers:        Constitutional    Fever or chills:      PHYSICAL  EXAMINATION:  Vitals:   05/25/24 1308  BP: 115/75  Pulse: 76  Temp: 97.9 F (36.6 C)  TempSrc: Temporal  Weight: 116 lb 1.6 oz (52.7 kg)    General:  WDWN in NAD; vital signs documented above Gait: Not observed HENT: WNL, normocephalic Pulmonary: normal non-labored breathing Cardiac: regular HR Abdomen: soft, NT, no masses Skin: without rashes Vascular Exam/Pulses: palpable L PT pulse Extremities: without ischemic changes, without Gangrene , without cellulitis; without open wounds;  Musculoskeletal: no muscle wasting or atrophy  Neurologic: A&O X 3 Psychiatric:  The pt has Normal affect.   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 female here for follow up for surveillance of bilateral common iliac vein stenting and for evaluation of left lower extremity venous reflux  Swelling symptoms are stable and manageable.  She denies any increase in edema over the past year.  She wears compression regularly and elevates her leg.  Duplex demonstrates widely patent common iliac vein stents bilaterally.  Left lower extremity venous reflux study was negative for DVT.  She has mild GSV reflux.  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  We will not repeat a left lower extremity venous reflux study however will repeat IVC/bilateral iliac venous duplex in 1 year.  She will notify the office with any questions or concerns.   Donnice Sender, PA-C [facility] [phone]  Clinic MD:   [place]

## 2024-05-25 NOTE — Patient Instructions (Signed)
 Return in 1 month for next injection.

## 2024-05-26 MED ORDER — LISDEXAMFETAMINE DIMESYLATE 60 MG PO CAPS: 60.0000 mg | ORAL_CAPSULE | ORAL | 0 refills | Status: DC

## 2024-05-29 ENCOUNTER — Other Ambulatory Visit: Payer: Self-pay | Admitting: Physician Assistant

## 2024-05-29 DIAGNOSIS — K5909 Other constipation: Secondary | ICD-10-CM

## 2024-06-01 NOTE — Telephone Encounter (Signed)
 SABRA

## 2024-06-23 ENCOUNTER — Ambulatory Visit

## 2024-06-23 VITALS — BP 123/77 | HR 88 | Ht 62.0 in

## 2024-06-23 DIAGNOSIS — E538 Deficiency of other specified B group vitamins: Secondary | ICD-10-CM

## 2024-06-23 MED ORDER — CYANOCOBALAMIN 1000 MCG/ML IJ SOLN
1000.0000 ug | Freq: Once | INTRAMUSCULAR | Status: AC
  Administered 2024-06-23: 1000 ug via INTRAMUSCULAR

## 2024-06-23 NOTE — Patient Instructions (Signed)
 Patient will return in 30 days  for B12 injection and then will have blood work recheck of B12 levels.

## 2024-06-23 NOTE — Progress Notes (Signed)
   Established Patient Office Visit  Subjective   Patient ID: April Vega, female    DOB: Jan 24, 1971  Age: 53 y.o. MRN: [id number]  Chief Complaint  Patient presents with   B12 deficiency    Vit B12 injection nurse visit    HPI  B 12 deficiency nurse visit - admin Vit B12 Injection. Patient denies weakness, GI problems, irregular Heart rate or medication problems.   ROS    Objective:     BP 123/77   Pulse 88   Ht 5' 2 (1.575 m)   SpO2 100%   BMI 21.23 kg/m    Physical Exam   No results found for any visits on 06/23/24.    The ASCVD Risk score (Arnett DK, et al., 2019) failed to calculate for the following reasons:   Cannot find a previous HDL lab   Cannot find a previous total cholesterol lab    Assessment & Plan:  Admin cyanocobalamin  1,092mcg IM Left deltoid. Patient tolerated injection well without complications. Patient will return in 30 days  for B12 injection and then will have blood work recheck of B12 levels.  Problem List Items Addressed This Visit   None   No follow-ups on file.    Suzen SHAUNNA Plenty, LPN

## 2024-07-05 ENCOUNTER — Ambulatory Visit

## 2024-07-25 ENCOUNTER — Ambulatory Visit (INDEPENDENT_AMBULATORY_CARE_PROVIDER_SITE_OTHER)

## 2024-07-25 VITALS — BP 118/65 | HR 79 | Ht 62.0 in

## 2024-07-25 DIAGNOSIS — E538 Deficiency of other specified B group vitamins: Secondary | ICD-10-CM | POA: Diagnosis not present

## 2024-07-25 MED ORDER — CYANOCOBALAMIN 1000 MCG/ML IJ SOLN
1000.0000 ug | Freq: Once | INTRAMUSCULAR | Status: AC
  Administered 2024-07-25: 1000 ug via INTRAMUSCULAR

## 2024-07-25 NOTE — Progress Notes (Signed)
   Established Patient Office Visit  Subjective   Patient ID: April Vega, female    DOB: 03/06/1971  Age: 53 y.o. MRN: [id number]  Chief Complaint  Patient presents with   B12 deficiency    B12 injection - nurse visit.     HPI  B12 deficiency. B12 injection nurse visit.  Patient denies weakness, irregular heart rate, GI Problems or medication problems.   ROS    Objective:     BP 118/65   Pulse 79   Ht 5' 2 (1.575 m)   SpO2 100%   BMI 21.23 kg/m    Physical Exam   No results found for any visits on 07/25/24.    The ASCVD Risk score (Arnett DK, et al., 2019) failed to calculate for the following reasons:   Cannot find a previous HDL lab   Cannot find a previous total cholesterol lab    Assessment & Plan:  Cyanocobalamin  1,036mcg IM right deltoid. Patient tolerated injection well without complications. Return in 30 days for B12 lab work and  next B12 injection as nurse visit ( lab work must be drawn before injection given in office) . Orders in chart.  Problem List Items Addressed This Visit       Other   B12 deficiency - Primary   Relevant Orders   B12    Return in about 1 month (around 08/24/2024) for B12  lab work and  B12 injection as nurse visit. April Suzen SHAUNNA Alpheus, LPN

## 2024-07-25 NOTE — Patient Instructions (Signed)
 Return in 30 days for B12 lab work and B12  injection as nurse visit.

## 2024-07-27 ENCOUNTER — Other Ambulatory Visit (HOSPITAL_COMMUNITY): Payer: Self-pay

## 2024-07-27 ENCOUNTER — Ambulatory Visit: Payer: Self-pay

## 2024-07-27 ENCOUNTER — Ambulatory Visit

## 2024-07-27 DIAGNOSIS — F909 Attention-deficit hyperactivity disorder, unspecified type: Secondary | ICD-10-CM

## 2024-07-27 NOTE — Telephone Encounter (Signed)
 Copied from CRM [id number]. Topic: Clinical - Medication Prior Auth >> Jul 27, 2024 10:26 AM Mercer PEDLAR wrote: Reason for CRM: Patient was told by pharmacy that prior auth is needed for lisdexamfetamine (VYVANSE ) 60 MG capsule.  [id number] [place], [place] - [address], [place] [address] Phone: [phone]  Fax: [phone]

## 2024-07-28 ENCOUNTER — Ambulatory Visit

## 2024-07-28 MED ORDER — LISDEXAMFETAMINE DIMESYLATE 60 MG PO CAPS: 60.0000 mg | ORAL_CAPSULE | ORAL | 0 refills | Status: AC

## 2024-07-28 NOTE — Addendum Note (Signed)
 Addended by: ANTONIETTE VERMELL CROME on: 07/28/2024 03:48 PM   Modules accepted: Orders

## 2024-07-28 NOTE — Telephone Encounter (Signed)
 Sent refill

## 2024-08-01 ENCOUNTER — Encounter: Payer: Self-pay | Admitting: Physician Assistant

## 2024-08-01 DIAGNOSIS — M21962 Unspecified acquired deformity of left lower leg: Secondary | ICD-10-CM | POA: Diagnosis not present

## 2024-08-01 DIAGNOSIS — R52 Pain, unspecified: Secondary | ICD-10-CM | POA: Diagnosis not present

## 2024-08-01 DIAGNOSIS — M84369P Stress fracture, unspecified tibia and fibula, subsequent encounter for fracture with malunion: Secondary | ICD-10-CM | POA: Diagnosis not present

## 2024-08-11 DIAGNOSIS — M84369P Stress fracture, unspecified tibia and fibula, subsequent encounter for fracture with malunion: Secondary | ICD-10-CM | POA: Diagnosis not present

## 2024-08-11 DIAGNOSIS — M21962 Unspecified acquired deformity of left lower leg: Secondary | ICD-10-CM | POA: Diagnosis not present

## 2024-08-25 ENCOUNTER — Ambulatory Visit

## 2024-08-25 VITALS — BP 116/74 | HR 82 | Ht 62.0 in

## 2024-08-25 DIAGNOSIS — E538 Deficiency of other specified B group vitamins: Secondary | ICD-10-CM | POA: Diagnosis not present

## 2024-08-25 MED ORDER — CYANOCOBALAMIN 1000 MCG/ML IJ SOLN
1000.0000 ug | Freq: Once | INTRAMUSCULAR | Status: AC
  Administered 2024-08-25: 1000 ug via INTRAMUSCULAR

## 2024-08-25 NOTE — Patient Instructions (Signed)
 Return visit scheduled for  30 days but will be based  on lab results reviewed by Vermell Bologna, PA when they return.

## 2024-08-25 NOTE — Progress Notes (Signed)
   Established Patient Office Visit  Subjective   Patient ID: April Vega, female    DOB: 07-21-1971  Age: 53 y.o. MRN: [id number]  Chief Complaint  Patient presents with   B12 deficiency    Vit B12 injection  nurse visit     HPI  B12 deficiency. Vitamin B12 inejction as nurse visit. Last B12 injection given 07/25/2024.   Patient had lab work to check B12 levels before injection administered  today.   ROS    Objective:     BP 116/74 (BP Location: Right Arm, Patient Position: Sitting)   Pulse 82   Ht 5' 2 (1.575 m)   SpO2 100%   BMI 21.23 kg/m    Physical Exam   No results found for any visits on 08/25/24.    The ASCVD Risk score (Arnett DK, et al., 2019) failed to calculate for the following reasons:   Cannot find a previous HDL lab   Cannot find a previous total cholesterol lab    Assessment & Plan:  Cyanocobalamin    IM  Left deltoid. Patient tolerated injection well without complications.  Return visit scheduled for  30 days but will be based  on lab results reviewed by Vermell Bologna, PA when they return.  Problem List Items Addressed This Visit       Other   B12 deficiency - Primary    Return in about 30 days (around 09/24/2024) for B12 injection as nurse visit. SABRA Suzen SHAUNNA Alpheus, LPN

## 2024-08-26 LAB — VITAMIN B12: Vitamin B-12: 419 pg/mL (ref 232–1245)

## 2024-08-28 ENCOUNTER — Ambulatory Visit: Payer: Self-pay | Admitting: Physician Assistant

## 2024-08-28 NOTE — Progress Notes (Signed)
 B12 looks much better. Do you feel better? I would continue with shots because of better absorption.

## 2024-09-01 ENCOUNTER — Other Ambulatory Visit: Payer: Self-pay | Admitting: *Deleted

## 2024-09-01 ENCOUNTER — Other Ambulatory Visit: Payer: Self-pay | Admitting: Hematology and Oncology

## 2024-09-01 ENCOUNTER — Inpatient Hospital Stay: Attending: Hematology and Oncology

## 2024-09-01 ENCOUNTER — Other Ambulatory Visit: Payer: Self-pay

## 2024-09-01 DIAGNOSIS — E538 Deficiency of other specified B group vitamins: Secondary | ICD-10-CM | POA: Insufficient documentation

## 2024-09-01 DIAGNOSIS — R7989 Other specified abnormal findings of blood chemistry: Secondary | ICD-10-CM

## 2024-09-01 DIAGNOSIS — Z86718 Personal history of other venous thrombosis and embolism: Secondary | ICD-10-CM | POA: Diagnosis not present

## 2024-09-01 DIAGNOSIS — R778 Other specified abnormalities of plasma proteins: Secondary | ICD-10-CM | POA: Insufficient documentation

## 2024-09-01 LAB — CMP (CANCER CENTER ONLY)
ALT: 14 U/L (ref 0–44)
AST: 18 U/L (ref 15–41)
Albumin: 4.7 g/dL (ref 3.5–5.0)
Alkaline Phosphatase: 63 U/L (ref 38–126)
Anion gap: 10 (ref 5–15)
BUN: 11 mg/dL (ref 6–20)
CO2: 28 mmol/L (ref 22–32)
Calcium: 10.1 mg/dL (ref 8.9–10.3)
Chloride: 106 mmol/L (ref 98–111)
Creatinine: 0.73 mg/dL (ref 0.44–1.00)
GFR, Estimated: >60 mL/min (ref >60)
Glucose, Bld: 86 mg/dL (ref 70–99)
Potassium: 3.6 mmol/L (ref 3.5–5.1)
Sodium: 144 mmol/L (ref 135–145)
Total Bilirubin: 0.6 mg/dL (ref 0.0–1.2)
Total Protein: 8.1 g/dL (ref 6.5–8.1)

## 2024-09-01 LAB — CBC WITH DIFFERENTIAL (CANCER CENTER ONLY)
Abs Immature Granulocytes: 0.01 K/uL (ref 0.00–0.07)
Basophils Absolute: 0.1 K/uL (ref 0.0–0.1)
Basophils Relative: 2 %
Eosinophils Absolute: 0.3 K/uL (ref 0.0–0.5)
Eosinophils Relative: 6 %
HCT: 41.5 % (ref 36.0–46.0)
Hemoglobin: 13.4 g/dL (ref 12.0–15.0)
Immature Granulocytes: 0 %
Lymphocytes Relative: 29 %
Lymphs Abs: 1.3 K/uL (ref 0.7–4.0)
MCH: 28.5 pg (ref 26.0–34.0)
MCHC: 32.3 g/dL (ref 30.0–36.0)
MCV: 88.3 fL (ref 80.0–100.0)
Monocytes Absolute: 0.5 K/uL (ref 0.1–1.0)
Monocytes Relative: 12 %
Neutro Abs: 2.3 K/uL (ref 1.7–7.7)
Neutrophils Relative %: 51 %
Platelet Count: 399 K/uL (ref 150–400)
RBC: 4.7 MIL/uL (ref 3.87–5.11)
RDW: 14.3 % (ref 11.5–15.5)
WBC Count: 4.5 K/uL (ref 4.0–10.5)
nRBC: 0 % (ref 0.0–0.2)

## 2024-09-01 LAB — VITAMIN B12: Vitamin B-12: 697 pg/mL (ref 180–914)

## 2024-09-01 LAB — FERRITIN: Ferritin: 766 ng/mL — ABNORMAL HIGH (ref 11–307)

## 2024-09-01 LAB — IRON AND IRON BINDING CAPACITY (CC-WL,HP ONLY)
Iron: 59 ug/dL (ref 28–170)
Saturation Ratios: 22 % (ref 10.4–31.8)
TIBC: 272 ug/dL (ref 250–450)
UIBC: 212 ug/dL

## 2024-09-01 LAB — MAGNESIUM: Magnesium: 2.2 mg/dL (ref 1.7–2.4)

## 2024-09-20 NOTE — Telephone Encounter (Signed)
"  A user error has taken place: encounter opened in error, closed for administrative reasons.      "

## 2024-09-26 ENCOUNTER — Ambulatory Visit

## 2024-10-25 ENCOUNTER — Telehealth: Payer: Self-pay | Admitting: *Deleted

## 2024-10-25 ENCOUNTER — Encounter: Payer: Self-pay | Admitting: Physician Assistant

## 2024-10-25 ENCOUNTER — Other Ambulatory Visit: Payer: Self-pay | Admitting: *Deleted

## 2024-10-25 DIAGNOSIS — R928 Other abnormal and inconclusive findings on diagnostic imaging of breast: Secondary | ICD-10-CM

## 2024-10-25 LAB — HM MAMMOGRAPHY

## 2024-10-25 NOTE — Telephone Encounter (Signed)
 Referral pended for the provider's review and & release.

## 2024-10-25 NOTE — Telephone Encounter (Signed)
 Copied from CRM #[id number]. Topic: Clinical - Request for Lab/Test Order >> Oct 25, 2024 12:50 PM Ivette P wrote: Reason for CRM: Pt called in stating she was told to schedule mammogram. Was told need order in to be scheudled they have appt for today.   Please follow up with pt

## 2024-10-25 NOTE — Telephone Encounter (Signed)
 Called pt and advised her that she will need to get repeat mammo done gave her [facility] phone number to schedule [phone]

## 2024-10-26 ENCOUNTER — Telehealth: Payer: Self-pay

## 2024-10-26 ENCOUNTER — Encounter: Payer: Self-pay | Admitting: Physician Assistant

## 2024-10-26 NOTE — Telephone Encounter (Signed)
 Would this be a new start for Prolia ? Last Bone density showing in chart is 08/12/2022 Last B 12 given 08/25/2024 Last B12 labs 12/05/225- note B12 looks much better. Do you feel better? I would continue with shots because of better absorption.  If starting Prolia  - is it o.k. to receive both on same day?

## 2024-10-26 NOTE — Telephone Encounter (Signed)
 Copied from CRM #[id number]. Topic: Appointments - Scheduling Inquiry for Clinic >> Oct 26, 2024  8:34 AM Willma R wrote: Reason for CRM: Patient looking to schedule an appointment for Prolia  and B12.  Patient can be reached at [phone]

## 2024-10-26 NOTE — Telephone Encounter (Signed)
 Copied from CRM #[id number]. Topic: Clinical - Medication Refill >> Oct 26, 2024  8:35 AM Willma R wrote: Medication:  lisdexamfetamine  (VYVANSE ) 60 MG capsule Vitamin D , Ergocalciferol , (DRISDOL ) 1.25 MG (50000 UNIT) CAPS capsule  Has the patient contacted their pharmacy? Yes  This is the patient's preferred pharmacy:  [phone] [place], [place] - [address] [place] [address] Phone: [phone] Fax: [phone]  Is this the correct pharmacy for this prescription? Yes  Has the prescription been filled recently? No  Is the patient out of the medication? Yes  Has the patient been seen for an appointment in the last year OR does the patient have an upcoming appointment? Yes  Can we respond through MyChart? Yes  Agent: Please be advised that Rx refills may take up to 3 business days. We ask that you follow-up with your pharmacy.

## 2024-10-27 NOTE — Telephone Encounter (Signed)
 Needs appt

## 2024-10-27 NOTE — Telephone Encounter (Signed)
 Ok for prolia  and b12. She has up to date labs. She does need office visit for follow up.

## 2025-03-02 ENCOUNTER — Other Ambulatory Visit

## 2025-03-02 ENCOUNTER — Ambulatory Visit: Admitting: Hematology and Oncology
